# Patient Record
Sex: Male | Born: 1955 | Race: White | Smoking: Former smoker
Health system: Southern US, Community
[De-identification: ages and names within clinical notes are randomized; demographics above are authoritative.]

## PROBLEM LIST (undated history)

## (undated) DIAGNOSIS — I6529 Occlusion and stenosis of unspecified carotid artery: Secondary | ICD-10-CM

## (undated) DIAGNOSIS — I499 Cardiac arrhythmia, unspecified: Secondary | ICD-10-CM

## (undated) DIAGNOSIS — J45909 Unspecified asthma, uncomplicated: Secondary | ICD-10-CM

## (undated) DIAGNOSIS — Z9289 Personal history of other medical treatment: Secondary | ICD-10-CM

## (undated) DIAGNOSIS — H409 Unspecified glaucoma: Secondary | ICD-10-CM

## (undated) DIAGNOSIS — I10 Essential (primary) hypertension: Secondary | ICD-10-CM

## (undated) DIAGNOSIS — I639 Cerebral infarction, unspecified: Secondary | ICD-10-CM

## (undated) DIAGNOSIS — E7800 Pure hypercholesterolemia, unspecified: Secondary | ICD-10-CM

## (undated) DIAGNOSIS — K219 Gastro-esophageal reflux disease without esophagitis: Secondary | ICD-10-CM

## (undated) HISTORY — DX: Occlusion and stenosis of unspecified carotid artery: I65.29

## (undated) HISTORY — PX: APPENDECTOMY: SHX54

## (undated) HISTORY — PX: VASCULAR SURGERY: SHX849

## (undated) HISTORY — DX: Personal history of other medical treatment: Z92.89

## (undated) HISTORY — PX: EYE SURGERY: SHX253

---

## 2004-05-30 ENCOUNTER — Emergency Department (HOSPITAL_COMMUNITY): Admission: EM | Admit: 2004-05-30 | Discharge: 2004-05-30 | Payer: Self-pay | Admitting: Emergency Medicine

## 2004-06-25 ENCOUNTER — Emergency Department (HOSPITAL_COMMUNITY): Admission: EM | Admit: 2004-06-25 | Discharge: 2004-06-25 | Payer: Self-pay | Admitting: Emergency Medicine

## 2005-08-14 ENCOUNTER — Emergency Department (HOSPITAL_COMMUNITY): Admission: EM | Admit: 2005-08-14 | Discharge: 2005-08-14 | Payer: Self-pay | Admitting: Emergency Medicine
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
## 2008-09-25 ENCOUNTER — Emergency Department (HOSPITAL_COMMUNITY): Admission: EM | Admit: 2008-09-25 | Discharge: 2008-09-25 | Payer: Self-pay | Admitting: Emergency Medicine

## 2008-10-17 ENCOUNTER — Emergency Department (HOSPITAL_COMMUNITY): Admission: EM | Admit: 2008-10-17 | Discharge: 2008-10-17 | Payer: Self-pay | Admitting: Emergency Medicine

## 2009-08-24 ENCOUNTER — Encounter
Admission: RE | Admit: 2009-08-24 | Discharge: 2009-10-17 | Payer: Self-pay | Admitting: Physical Medicine & Rehabilitation

## 2009-08-27 ENCOUNTER — Ambulatory Visit: Payer: Self-pay | Admitting: Physical Medicine & Rehabilitation

## 2009-09-24 ENCOUNTER — Ambulatory Visit: Payer: Self-pay | Admitting: Physical Medicine & Rehabilitation

## 2009-10-17 ENCOUNTER — Emergency Department (HOSPITAL_COMMUNITY): Admission: EM | Admit: 2009-10-17 | Discharge: 2009-10-17 | Payer: Self-pay | Admitting: Emergency Medicine

## 2009-10-23 ENCOUNTER — Ambulatory Visit: Payer: Self-pay | Admitting: Physical Medicine & Rehabilitation

## 2009-10-23 ENCOUNTER — Encounter
Admission: RE | Admit: 2009-10-23 | Discharge: 2009-10-24 | Payer: Self-pay | Admitting: Physical Medicine & Rehabilitation

## 2010-04-16 ENCOUNTER — Ambulatory Visit (HOSPITAL_COMMUNITY): Admission: RE | Admit: 2010-04-16 | Discharge: 2010-04-16 | Payer: Self-pay | Admitting: Internal Medicine
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
## 2012-08-07 ENCOUNTER — Emergency Department (HOSPITAL_COMMUNITY)
Admission: EM | Admit: 2012-08-07 | Discharge: 2012-08-07 | Disposition: A | Discharge status: Home / Self Care | Payer: Self-pay | Source: Home / Self Care | Attending: Family Medicine | Admitting: Family Medicine

## 2012-08-07 ENCOUNTER — Encounter (HOSPITAL_COMMUNITY): Payer: Self-pay | Admitting: Emergency Medicine

## 2012-08-07 DIAGNOSIS — J029 Acute pharyngitis, unspecified: Secondary | ICD-10-CM

## 2012-08-07 HISTORY — DX: Unspecified asthma, uncomplicated: J45.909

## 2012-08-07 MED ORDER — CETIRIZINE HCL 10 MG PO TABS: 10.0000 mg | ORAL_TABLET | Freq: Every day | ORAL | Status: DC

## 2012-08-07 MED ORDER — IPRATROPIUM BROMIDE 0.06 % NA SOLN: 2.0000 | Freq: Four times a day (QID) | NASAL | Status: DC

## 2012-08-07 NOTE — ED Provider Notes (Signed)
History     CSN: [id number]  Arrival date & time 08/07/12  1759   First MD Initiated Contact with Patient 08/07/12 1804      Chief Complaint  Patient presents with  . Sore Throat    (Consider location/radiation/quality/duration/timing/severity/associated sxs/prior treatment) Patient is a 56 y.o. male presenting with pharyngitis. The history is provided by the patient and the spouse.  Sore Throat This is a new problem. The current episode started 3 to 5 hours ago (looked up while talking to mother and had sudden difficulty swallowing soreness.). The problem has not changed since onset.Associated symptoms comments: Copious pnd, no resp or vocal difficulty.. The symptoms are aggravated by swallowing.    Past Medical History  Diagnosis Date  . Asthma     Past Surgical History  Procedure Date  . Appendectomy     History reviewed. No pertinent family history.  History  Substance Use Topics  . Smoking status: Never Smoker   . Smokeless tobacco: Not on file  . Alcohol Use: No      Review of Systems  Constitutional: Negative.   HENT: Positive for congestion, rhinorrhea and postnasal drip. Negative for trouble swallowing, neck pain and voice change.   Respiratory: Negative.   Cardiovascular: Negative.   Gastrointestinal: Negative.   Skin: Negative.     Allergies  Review of patient's allergies indicates no known allergies.  Home Medications   Current Outpatient Rx  Name Route Sig Dispense Refill  . HYDROCODONE-ACETAMINOPHEN 5-500 MG PO CAPS Oral Take 1 capsule by mouth every 6 (six) hours as needed.    Marland Kitchen MOBIC PO Oral Take by mouth.    . CETIRIZINE HCL 10 MG PO TABS Oral Take 1 tablet (10 mg total) by mouth daily. One tab daily for allergies 30 tablet 1  . IPRATROPIUM BROMIDE 0.06 % NA SOLN Nasal Place 2 sprays into the nose 4 (four) times daily. 15 mL 1    BP 123/73  Pulse 72  Temp 97.5 F (36.4 C) (Oral)  Resp 18  SpO2 98%  Physical Exam  Nursing note  and vitals reviewed. Constitutional: He is oriented to person, place, and time. He appears well-developed and well-nourished. No distress.  HENT:  Head: Normocephalic.  Right Ear: External ear normal.  Left Ear: External ear normal.  Nose: Nose normal.  Mouth/Throat: Oropharynx is clear and moist.  Eyes: Conjunctivae normal are normal. Pupils are equal, round, and reactive to light.  Neck: Normal range of motion. Neck supple.  Cardiovascular: Normal rate, regular rhythm, normal heart sounds and intact distal pulses.   Pulmonary/Chest: Effort normal and breath sounds normal.  Lymphadenopathy:    He has no cervical adenopathy.  Neurological: He is alert and oriented to person, place, and time.  Skin: Skin is warm and dry.    ED Course  Procedures (including critical care time)  Labs Reviewed - No data to display No results found.   1. Allergic pharyngitis       MDM          Linna Hoff, MD 08/07/12 [phone]

## 2012-08-07 NOTE — ED Notes (Addendum)
Pt states "he swallowed his adams apple". Pt denies sob and pain. Feels uncomfortable. Pt states that earlier in the week he had a very thick mucus drainage in the back of his throat.

## 2012-08-07 NOTE — Discharge Instructions (Signed)
Use medicine as prescribed and see ent doctor if further problems

## 2012-08-17 ENCOUNTER — Other Ambulatory Visit: Payer: Self-pay | Admitting: Otolaryngology

## 2012-08-17 DIAGNOSIS — F458 Other somatoform disorders: Secondary | ICD-10-CM

## 2012-08-17 DIAGNOSIS — R09A2 Foreign body sensation, throat: Secondary | ICD-10-CM

## 2012-08-17 DIAGNOSIS — K219 Gastro-esophageal reflux disease without esophagitis: Secondary | ICD-10-CM

## 2012-08-18 ENCOUNTER — Ambulatory Visit
Admission: RE | Admit: 2012-08-18 | Discharge: 2012-08-18 | Disposition: A | Discharge status: Home / Self Care | Payer: Medicare Other | Source: Ambulatory Visit | Attending: Otolaryngology | Admitting: Otolaryngology

## 2012-08-18 DIAGNOSIS — K219 Gastro-esophageal reflux disease without esophagitis: Secondary | ICD-10-CM

## 2012-08-18 DIAGNOSIS — F458 Other somatoform disorders: Secondary | ICD-10-CM

## 2012-08-18 IMAGING — CT CT NECK W/ CM
4 of 5 series · 15 of 33 positions shown, 18 images · IV contrast (75CC OMNI 300)
Comparison: Head CT without contrast [DATE].

CLINICAL DATA: 56-year-old male with foreign body sensation in
throat.  Dysphagia.  Right neck swelling.

CT NECK WITH CONTRAST
TECHNIQUE: Multidetector CT imaging of the neck was performed with
intravenous contrast.
Contrast: 75mL OMNIPAQUE IOHEXOL 300 MG/ML  SOLN

[Series 2: axial neck · axial · 0.47mm/px · z∈[+99,+156]mm · 2 of 114 slices shown]
[im 23/114  bone]
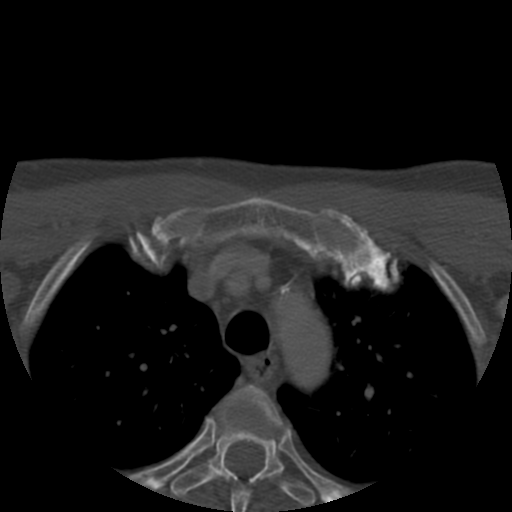
[im 46/114  bone]
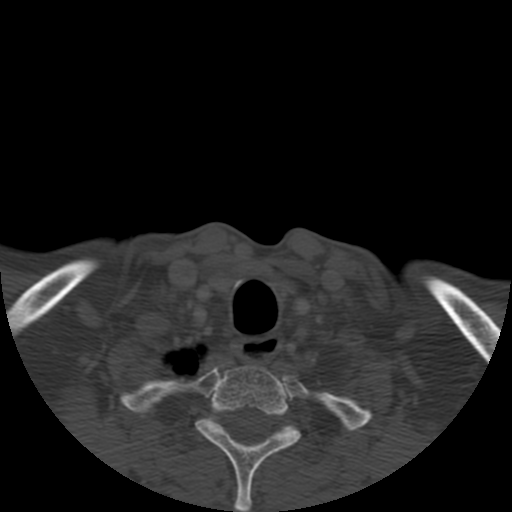

[Series 400: cor · coronal · 0.57mm/px · 3 of 97 slices shown]
[im 20/97  bone]
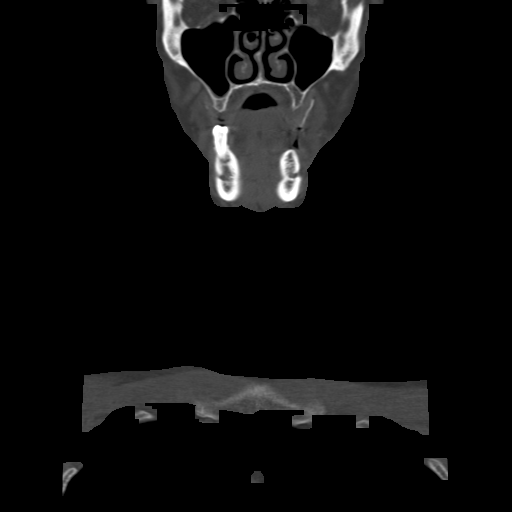
[im 39/97  bone]
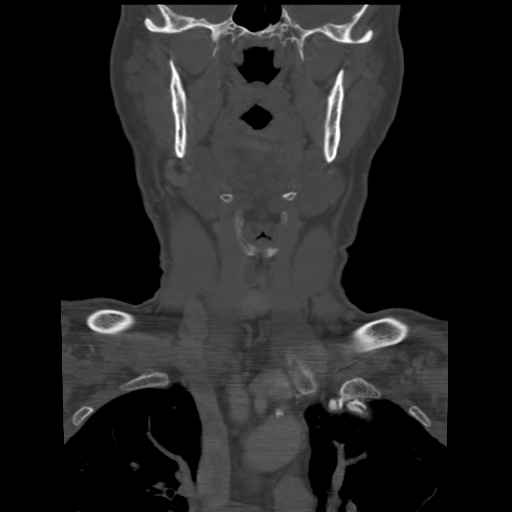
[im 58/97  bone]
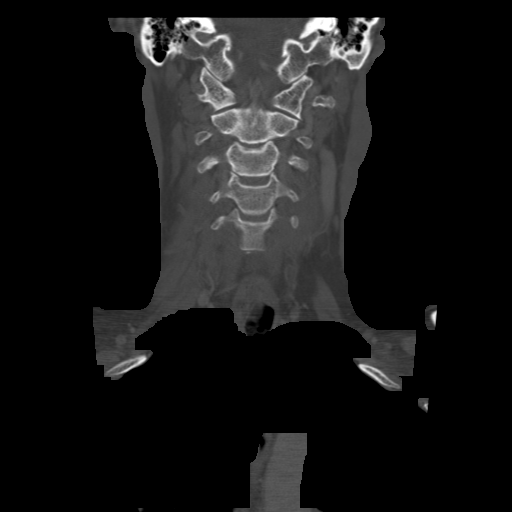

[Series 401: sag · sagittal · 0.57mm/px · 5 of 112 slices shown, 6 images]
[im 38/112  bone]
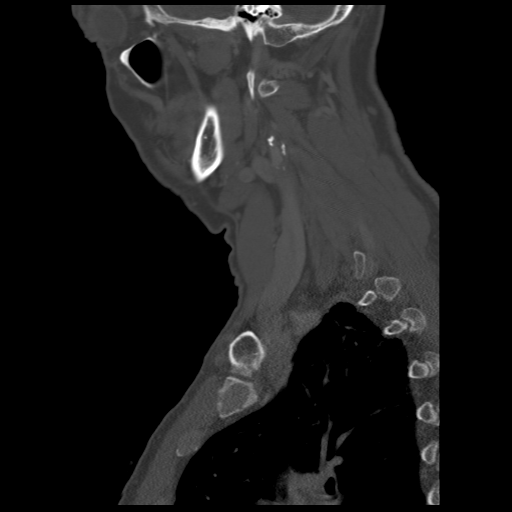
[im 47/112  bone]
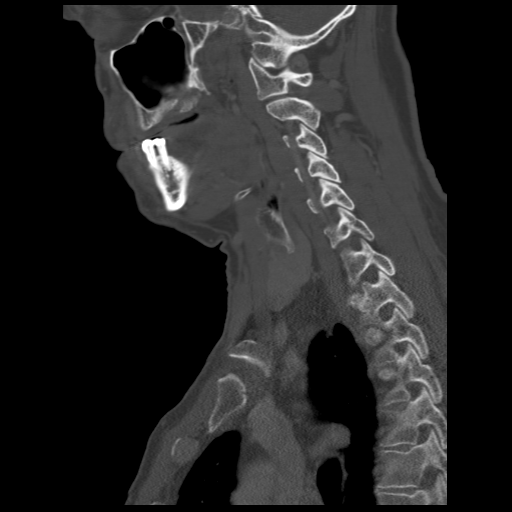
[im 56/112  soft-tissue]
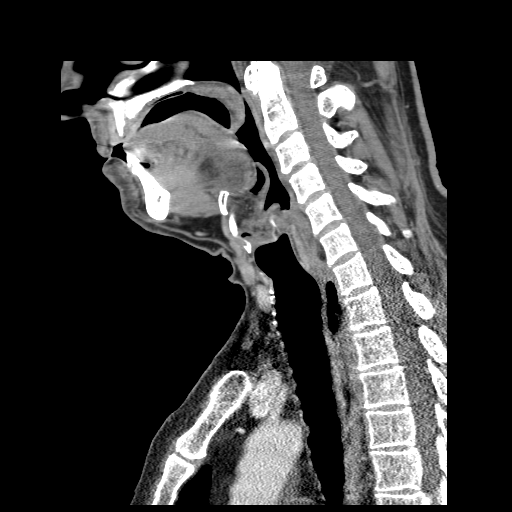
[im 56/112  bone]
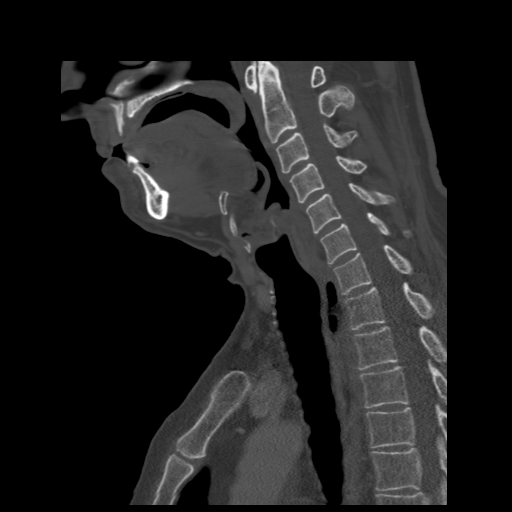
[im 65/112  bone]
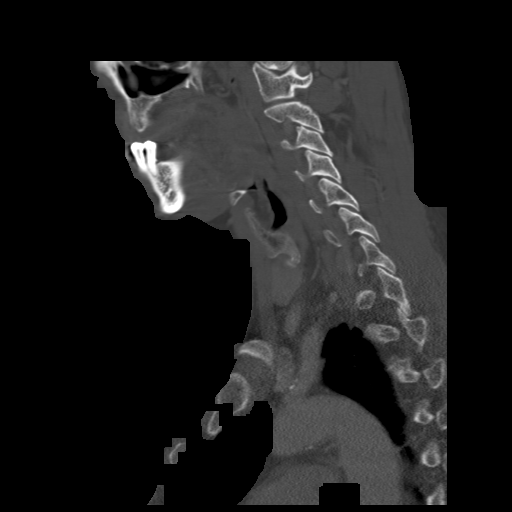
[im 75/112  bone]
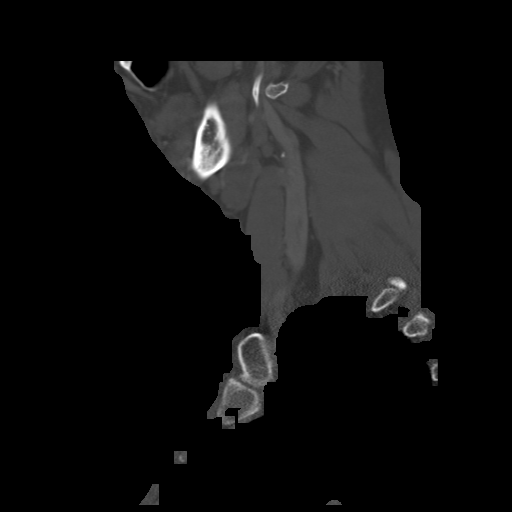

[Series 402: angled axial · axial · 0.47mm/px · z∈[+72,+231]mm · 5 of 129 slices shown, 7 images]
[im 22/129  soft-tissue]
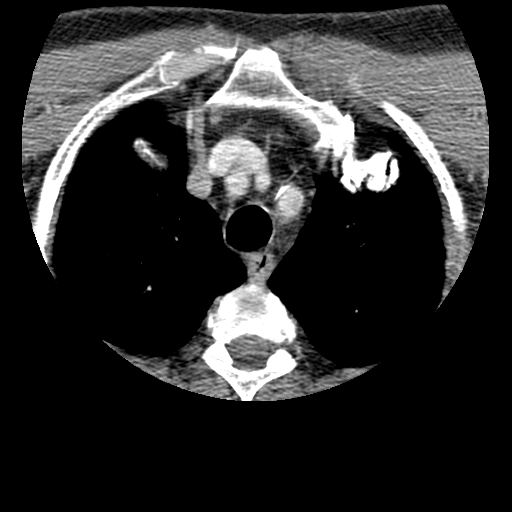
[im 22/129  bone]
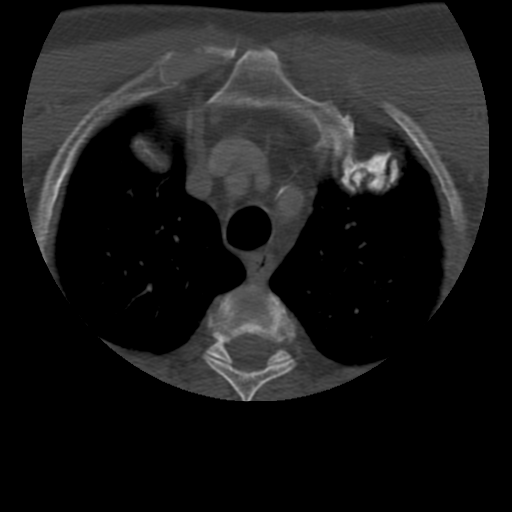
[im 43/129  bone]
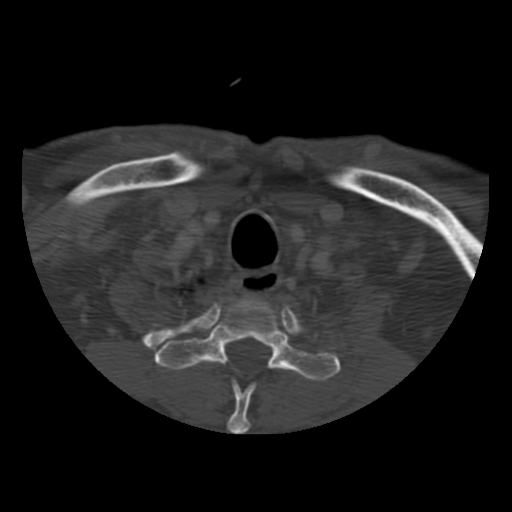
[im 65/129  bone]
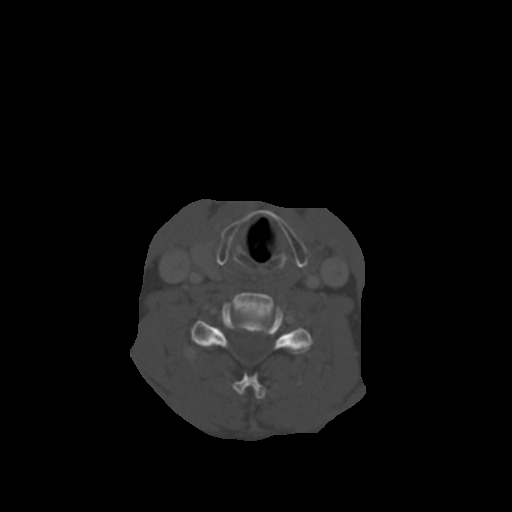
[im 86/129  bone]
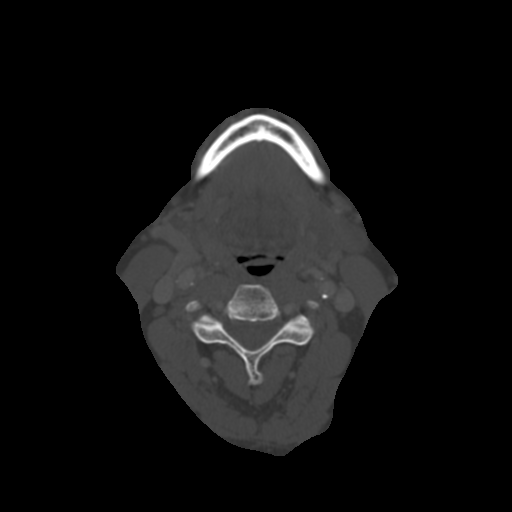
[im 107/129  soft-tissue]
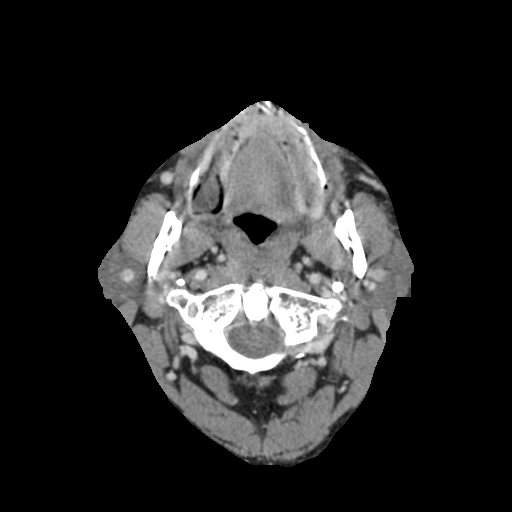
[im 107/129  bone]
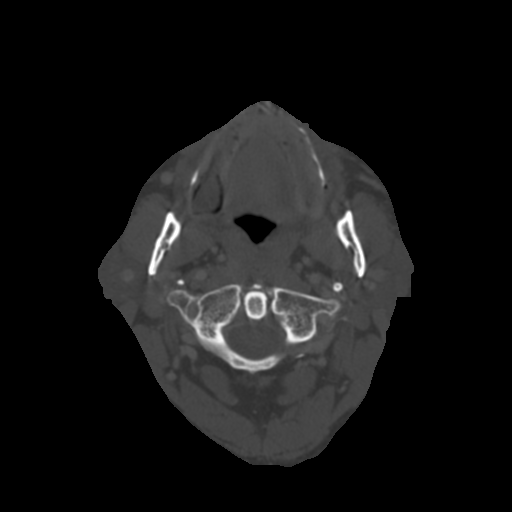

[15 of 33 positions shown; findings below may reference images not displayed]

FINDINGS: Area of clinical concern/abnormal sensation marked on the
scan at the anterior neck just to the right of midline, series 2
image 61.  Subjacent to the marker there is a prominent thyroidal
vein.  This is at the level of the thyroid lobes which appear
within normal limits.  The other nearby anterior neck soft tissues
appear normal.

No cervical lymphadenopathy.

Mild motion artifact affecting visualization of the pharynx.  No
discrete pharyngeal or laryngeal mass or abnormality is identified.
The glottis is closed.  Subglottic trachea, cervical esophagus, and
visualized upper thoracic esophagus appear within normal limits.

Major vascular structures in the neck are patent.  There is
bilateral carotid bifurcation and proximal ICA atherosclerosis
including calcified and soft plaque.  Stenosis on the right appears
to be hemodynamically significant (series 2 image 32), estimated
stenosis 68-70 % with respect to the distal vessel.

Retropharyngeal, parapharyngeal, and sublingual spaces are within
normal limits.  Submandibular and parotid glands are within normal
limits.  Negative visualized superior mediastinum. Visualized orbit
soft tissues are within normal limits.

Visualized paranasal sinuses and mastoids are clear.  Severe dental
caries in the residual left mandibular molar.  Intermittent but
overall mild for age cervical spine degenerative osseous changes.
No acute osseous abnormality identified.  Minor scarring and
paraseptal changes at the lung apices.  Otherwise negative
visualized lung parenchyma.
IMPRESSION: 1.  No neck mass or cervical lymphadenopathy.
2.  Age advanced carotid atherosclerosis with significant/high-
grade right ICA stenosis suspected.
3.  Dental caries left mandibular molar.

## 2012-08-18 MED ORDER — IOHEXOL 300 MG/ML  SOLN
75.0000 mL | Freq: Once | INTRAMUSCULAR | Status: AC | PRN
  Administered 2012-08-18: 75 mL via INTRAVENOUS

## 2013-09-20 ENCOUNTER — Encounter: Payer: Self-pay | Admitting: Cardiology

## 2013-09-20 ENCOUNTER — Encounter: Payer: Self-pay | Admitting: Interventional Cardiology

## 2013-09-20 ENCOUNTER — Ambulatory Visit (INDEPENDENT_AMBULATORY_CARE_PROVIDER_SITE_OTHER): Payer: Medicare Other | Admitting: Interventional Cardiology

## 2013-09-20 VITALS — Ht 72.0 in | Wt 196.8 lb

## 2013-09-20 DIAGNOSIS — I4891 Unspecified atrial fibrillation: Secondary | ICD-10-CM | POA: Insufficient documentation

## 2013-09-20 MED ORDER — METOPROLOL SUCCINATE ER 25 MG PO TB24: 25.0000 mg | ORAL_TABLET | Freq: Every day | ORAL | Status: DC

## 2013-09-20 MED ORDER — ASPIRIN EC 325 MG PO TBEC: 325.0000 mg | DELAYED_RELEASE_TABLET | Freq: Every day | ORAL | Status: DC
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
## 2013-09-20 NOTE — Patient Instructions (Signed)
Your physician has requested that you have an echocardiogram. Echocardiography is a painless test that uses sound waves to create images of your heart. It provides your doctor with information about the size and shape of your heart and how well your heart's chambers and valves are working. This procedure takes approximately one hour. There are no restrictions for this procedure.  Your physician has recommended you make the following change in your medication:   1. Start Aspirin 325 mg 1 tablet by mouth daily.  2. Start Metoprolol Succinate 25 mg 1 tablet by mouth daily.

## 2013-10-10 ENCOUNTER — Encounter: Payer: Self-pay | Admitting: Cardiovascular Disease

## 2013-10-10 ENCOUNTER — Ambulatory Visit (HOSPITAL_COMMUNITY): Payer: Medicare Other | Attending: Cardiovascular Disease | Admitting: Cardiology

## 2013-10-10 DIAGNOSIS — Z87891 Personal history of nicotine dependence: Secondary | ICD-10-CM | POA: Insufficient documentation

## 2013-10-10 DIAGNOSIS — I4891 Unspecified atrial fibrillation: Secondary | ICD-10-CM | POA: Insufficient documentation

## 2013-10-10 NOTE — Progress Notes (Signed)
Echo performed. 

## 2013-10-17 ENCOUNTER — Telehealth: Payer: Self-pay | Admitting: Cardiology

## 2013-10-17 ENCOUNTER — Telehealth: Payer: Self-pay | Admitting: Interventional Cardiology

## 2013-10-17 DIAGNOSIS — Z79899 Other long term (current) drug therapy: Secondary | ICD-10-CM

## 2013-10-17 MED ORDER — FUROSEMIDE 40 MG PO TABS: 40.0000 mg | ORAL_TABLET | Freq: Every day | ORAL | Status: DC

## 2013-10-17 NOTE — Telephone Encounter (Signed)
Pt.notified

## 2013-10-17 NOTE — Telephone Encounter (Signed)
New problem    Pt has questions about his new med and the reaction to his sever clog coma??   Please give him a call back.

## 2013-10-17 NOTE — Telephone Encounter (Signed)
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 Ok to take lasix.

## 2013-10-17 NOTE — Telephone Encounter (Signed)
Pt notified.Pt states he has mild SOB. Per Dr. Eldridge Dace pt can try lasix 40 mg qd and come back for Bmet on 10/24/13. Pt agreeable and rx sent in.

## 2013-10-17 NOTE — Telephone Encounter (Signed)
Message copied by Theda Sers on Mon Oct 17, 2013 10:45 AM ------      Message from: Corky Crafts      Created: Mon Oct 17, 2013 10:37 AM       Mildly decreased left ventricular function. May be related to the atrial fibrillation.  Likely mild fluid overload as well. This could be treated with diuretics if he has swelling or shortness of breath. ------

## 2013-10-24 ENCOUNTER — Other Ambulatory Visit (INDEPENDENT_AMBULATORY_CARE_PROVIDER_SITE_OTHER): Payer: Medicare Other

## 2013-10-24 DIAGNOSIS — Z79899 Other long term (current) drug therapy: Secondary | ICD-10-CM

## 2013-10-24 LAB — BASIC METABOLIC PANEL
BUN: 24 mg/dL — ABNORMAL HIGH (ref 6–23)
Calcium: 9.7 mg/dL (ref 8.4–10.5)
GFR: 40.41 mL/min — ABNORMAL LOW (ref >60.00)
Glucose, Bld: 99 mg/dL (ref 70–99)
Potassium: 4 mEq/L (ref 3.5–5.1)
Sodium: 138 mEq/L (ref 135–145)

## 2013-11-07 ENCOUNTER — Encounter (INDEPENDENT_AMBULATORY_CARE_PROVIDER_SITE_OTHER): Payer: Self-pay

## 2013-11-07 ENCOUNTER — Ambulatory Visit (INDEPENDENT_AMBULATORY_CARE_PROVIDER_SITE_OTHER): Payer: Medicare HMO | Admitting: Interventional Cardiology

## 2013-11-07 ENCOUNTER — Encounter: Payer: Self-pay | Admitting: Interventional Cardiology

## 2013-11-07 VITALS — BP 130/100 | HR 52 | Ht 72.0 in | Wt 205.8 lb

## 2013-11-07 DIAGNOSIS — I4891 Unspecified atrial fibrillation: Secondary | ICD-10-CM
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
## 2013-11-07 NOTE — Patient Instructions (Signed)
Your physician recommends that you continue on your current medications as directed. Please refer to the Current Medication list given to you today.  Your physician wants you to follow-up in: 6 Months with Dr. Varanasi. You will receive a reminder letter in the mail two months in advance. If you don't receive a letter, please call our office to schedule the follow-up appointment. 

## 2013-11-10 ENCOUNTER — Ambulatory Visit: Payer: Medicare Other | Admitting: Interventional Cardiology

## 2015-03-21 ENCOUNTER — Encounter: Payer: Self-pay | Admitting: General Surgery

## 2015-03-21 NOTE — Progress Notes (Signed)
Tyler Burns 03/20/2015 10:05 AM [facility] Patient #: [id number] DOB: [birth year] Divorced / Language: [place] / Race: White Male  History of Present Illness Tyler Burns(Tyler Burns J. Tyler Schlotter MD; 03/20/2015 11:30 AM) Patient words: hernia.  The patient is a 59 year old male   Note:He was referred by Dr. Vernie Burns for further evaluation and treatment of a right inguinal hernia. About 4 months ago, he began noticing discomfort in his right groin and testicle and a small amount of swelling there. He was referred to Dr. Vernie Burns because of an elevated PSA. On physical exam, he was noted to have a right inguinal hernia. He states it bothers him when he standing for a long period of time. No difficulty with urination. No difficulty with constipation. He is disabled and does not do a lot of heavy lifting. He is unaware of any family history of inguinal hernia. His girlfriend lives with him.   Other Problems Tyler Burns(Tyler Burns, CMA; 03/20/2015 10:06 AM) Gastroesophageal Reflux Disease  Past Surgical History Tyler Burns(Tyler Burns, CMA; 03/20/2015 10:06 AM) Vasectomy  Diagnostic Studies History Tyler Burns(Tyler [place], [place]; 03/20/2015 10:06 AM) Colonoscopy never  Allergies Tyler Burns(Tyler Burns, CMA; 03/20/2015 10:06 AM) No Known Allergies05/24/2016  Medication History Tyler Burns(Tyler Burns, CMA; 03/20/2015 10:07 AM) Metoprolol Succinate ER (25MG  Tablet ER 24HR, Oral) Active. Lovastatin (40MG  Tablet, Oral) Active. Lisinopril (20MG  Tablet, Oral) Active. Hydrocodone-Acetaminophen (5-325MG  Tablet, Oral) Active. Furosemide (40MG  Tablet, Oral) Active. Lactulose (10GM/15ML Solution, Oral) Active. ZyrTEC Allergy (10MG  Tablet, Oral) Active. Aspirin (325MG  Tablet, Oral) Active. Medications Reconciled  Social History Tyler Burns(Tyler [place], [place]; 03/20/2015 10:06 AM) Alcohol use Remotely quit alcohol use. Caffeine use Carbonated beverages, Coffee, Tea. No drug use Tobacco use Former smoker.  Family History Tyler Burns(Tyler Burns, [place];  03/20/2015 10:06 AM) Diabetes Mellitus Brother, Sister. Heart Disease Father, Mother. Kidney Disease Mother, Sister.  Review of Systems Tyler Burns(Tyler Burns CMA; 03/20/2015 10:06 AM) General Not Present- Appetite Loss, Chills, Fatigue, Fever, Night Sweats, Weight Gain and Weight Loss. Skin Not Present- Change in Wart/Mole, Dryness, Hives, Jaundice, New Lesions, Non-Healing Wounds, Rash and Ulcer. HEENT Not Present- Earache, Hearing Loss, Hoarseness, Nose Bleed, Oral Ulcers, Ringing in the Ears, Seasonal Allergies, Sinus Pain, Sore Throat, Visual Disturbances, Wears glasses/contact lenses and Yellow Eyes. Respiratory Not Present- Bloody sputum, Chronic Cough, Difficulty Breathing, Snoring and Wheezing. Breast Not Present- Breast Mass, Breast Pain, Nipple Discharge and Skin Changes. Cardiovascular Not Present- Chest Pain, Difficulty Breathing Lying Down, Leg Cramps, Palpitations, Rapid Heart Rate, Shortness of Breath and Swelling of Extremities. Gastrointestinal Not Present- Abdominal Pain, Bloating, Bloody Stool, Change in Bowel Habits, Chronic diarrhea, Constipation, Difficulty Swallowing, Excessive gas, Gets full quickly at meals, Hemorrhoids, Indigestion, Nausea, Rectal Pain and Vomiting. Male Genitourinary Not Present- Blood in Urine, Change in Urinary Stream, Frequency, Impotence, Nocturia, Painful Urination, Urgency and Urine Leakage. Neurological Not Present- Decreased Memory, Fainting, Headaches, Numbness, Seizures, Tingling, Tremor, Trouble walking and Weakness. Psychiatric Not Present- Anxiety, Bipolar, Change in Sleep Pattern, Depression, Fearful and Frequent crying. Endocrine Not Present- Cold Intolerance, Excessive Hunger, Hair Changes, Heat Intolerance, Hot flashes and New Diabetes. Hematology Not Present- Easy Bruising, Excessive bleeding, Gland problems, HIV and Persistent Infections.   Vitals Tyler Burns(Tyler Burns CMA; 03/20/2015 10:08 AM) 03/20/2015 10:07 AM Weight: 214 lb Height:  72in Body Surface Area: 2.22 m Body Mass Index: 29.02 kg/m Temp.: 97.61F(Oral)  Pulse: 80 (Regular)  Resp.: 17 (Unlabored)  BP: 142/80 (Sitting, Left Arm, Standard)    Physical Exam Tyler Burns(Tyler Burns J. Tyler Teater MD; 03/20/2015 11:37 AM) The physical exam findings are as follows: Note:General:  Overweight male in NAD. Pleasant and cooperative.  CV: Irregular rate and rhythm.  CHEST: Breath sounds equal and clear. Respirations nonlabored.  ABDOMEN: Soft, nontender, small reducible umbilical hernia.  GU: Tender, reducible right inguinal bulge. No left inguinal bulge.  MUSCULOSKELETAL: FROM, good muscle tone, no edema, no venous stasis changes  SKIN: No jaundice.  NEUROLOGIC: Alert and oriented, answers questions appropriately, normal gait and station.  PSYCHIATRIC: Normal mood, affect , and behavior.    Assessment & Plan Tyler Pollack MD; 03/20/2015 11:40 AM) RIGHT INGUINAL HERNIA (550.90  K40.90) Impression: The hernia is symptomatic. He also has atrial fibrillation and missed his follow-up appointment with Dr. Eldridge Burns. The last time he saw Dr. Eldridge Burns was January 2015.  Plan: Referral to cardiology for preoperative assessment and his routine follow-up. I recommended an open right inguinal hernia repair with mesh following that. I have explained the procedure, risks, and aftercare of inguinal hernia repair. Risks include but are not limited to bleeding, infection, wound problems, anesthesia, recurrence, bladder or intestine injury, urinary retention, testicular dysfunction, chronic pain, mesh problems. He seems to understand and agrees with the plan. We'll wait for his visit with Dr. Eldridge Burns. Current Plans  Referred to Cardiology, for evaluation and follow up (Cardiology).   Tyler Peace, MD

## 2015-05-01 ENCOUNTER — Telehealth: Payer: Self-pay | Admitting: Interventional Cardiology

## 2015-05-01 NOTE — Telephone Encounter (Signed)
New Message  RN from Dr. Abbey Chattersosenbower office following up about surgical clearance. Please call back and discuss.

## 2015-05-01 NOTE — Telephone Encounter (Signed)
Spoke with Cyndra NumbersBernie at Dr. Maris Bergerosenbower's office. Informed her that we have tried to contact the pt to schedule an appt for clearance and that the number we have has been disconnected. Cyndra NumbersBernie provided me with new number for pt [phone]. Spoke with pt and scheduled him to see Tereso NewcomerScott Weaver, PA-C tomorrow at 11:10AM for clearance and 1 yr f/u. Pt verbalized understanding and was in agreement with this plan.

## 2015-05-01 NOTE — Progress Notes (Signed)
Cardiology Office Note   Date:  05/02/2015   ID:  Tyler Burns, DOB [birth year], MRN [id number]  PCP:  Quitman Livings, MD  Cardiologist:  Dr. Everette Rank     Chief Complaint  Patient presents with  . Atrial Fibrillation  . Surgical Clearance     History of Present Illness: Tyler Burns is a 59 y.o. male with a hx of chronic atrial fibrillation, CKD, mildly depressed LV function. Last seen by Dr. Eldridge Dace 10/2013.  CHADS2-VASc=1 (sCHF).  He has been treated with ASA only.    He has a R inguinal hernia.  He is referred for surgical clearance prior to open repair.  Surgeon is Dr. Avel Peace.  He is significantly limited by his chronic back pain.  He cannot achieve 4 METs.  He denies chest pain or dyspnea with minimal activity.  He denies syncope.  He denies orthopnea, PND, edema.  He has a remote hx of smoking.  He is treated for HL.  He has a FHx of CAD (brother, mother with prior MI; sister died with CHF).     Studies/Reports Reviewed Today:  Echo 10/10/13 Mild focal basal hypertrophy of the septum, EF 45-50%, diffuse HK Trivial AI Mild MR Mild LAE, mild RAE Mild RVE    Past Medical History  Diagnosis Date  . Asthma     Past Surgical History  Procedure Laterality Date  . Appendectomy       Current Outpatient Prescriptions  Medication Sig Dispense Refill  . aspirin EC 325 MG tablet Take 1 tablet (325 mg total) by mouth daily. 30 tablet 0  . bimatoprost (LUMIGAN) 0.01 % SOLN Place 1 drop into both eyes.     . brimonidine (ALPHAGAN P) 0.1 % SOLN Place 1 drop into both eyes 2 (two) times daily.     . brinzolamide (AZOPT) 1 % ophthalmic suspension Place 1 drop into both eyes 2 (two) times daily.     . cetirizine (ZYRTEC) 10 MG tablet Take 1 tablet (10 mg total) by mouth daily. One tab daily for allergies 30 tablet 1  . cyanocobalamin ([employer] VITAMIN B12) 2000 MCG tablet Take 2,000 mcg by mouth daily.     . furosemide (LASIX) 40 MG tablet Take 1 tablet (40 mg total)  by mouth daily. 90 tablet 1  . HYDROcodone-acetaminophen (NORCO/VICODIN) 5-325 MG per tablet Take 1 tablet by mouth every 6 (six) hours as needed for moderate pain.    Marland Kitchen lactulose (CHRONULAC) 10 GM/15ML solution Take 10 g by mouth daily as needed for moderate constipation.   10  . lisinopril (PRINIVIL,ZESTRIL) 20 MG tablet Take 20 mg by mouth daily.   0  . lovastatin (MEVACOR) 20 MG tablet Take 20 mg by mouth at bedtime.    . metoprolol succinate (TOPROL XL) 25 MG 24 hr tablet Take 1 tablet (25 mg total) by mouth daily. 30 tablet 11  . pyridOXINE (VITAMIN B-6) 25 MG tablet Take 25 mg by mouth daily.     . vitamin E 400 UNIT capsule Take 400 Units by mouth daily.      No current facility-administered medications for this visit.    Allergies:   Brinzolamide-brimonidine    Social History:  The patient  reports that he has quit smoking. He does not have any smokeless tobacco history on file. He reports that he does not drink alcohol or use illicit drugs.   Family History:  The patient's family history includes Congestive Heart Failure in his mother; Heart attack (  age of onset: 86) in his brother; Heart attack (age of onset: 37) in his mother; Heart failure (age of onset: 44) in his sister; Leukemia in his brother. There is no history of Stroke.    ROS:   Please see the history of present illness.   Review of Systems  Cardiovascular: Positive for irregular heartbeat.  All other systems reviewed and are negative.    PHYSICAL EXAM: VS:  BP 110/78 mmHg  Pulse 92  Ht 6' (1.829 m)  Wt 218 lb (98.884 kg)  BMI 29.56 kg/m2    Wt Readings from Last 3 Encounters:  05/02/15 218 lb (98.884 kg)  11/07/13 205 lb 12.8 oz (93.35 kg)  09/20/13 196 lb 12.8 oz (89.268 kg)     GEN: Well nourished, well developed, in no acute distress HEENT: normal Neck: no JVD, no carotid bruits, no masses Cardiac:  Normal S1/S2, irregularly irregular rhythm; no murmur ,  no rubs or gallops, no edema     Respiratory:  clear to auscultation bilaterally, no wheezing, rhonchi or rales. GI: soft, nontender, nondistended, + BS MS: no deformity or atrophy Skin: warm and dry  Neuro:  CNs II-XII intact, Strength and sensation are intact Psych: Normal affect   EKG:  EKG is ordered today.  It demonstrates:   AFib, HR 92, no change from prior   Recent Labs: No results found for requested labs within last 365 days.    Lipid Panel No results found for: CHOL, TRIG, HDL, CHOLHDL, VLDL, LDLCALC, LDLDIRECT    ASSESSMENT AND PLAN:  1.  Pre-operative cardiovascular examination:  He is not able to achieve 4 METs of activity due to his chronic back pain.  He has several risk factors for CAD.  Prior echo with EF 45-50%.  Hernia repair would overall be low risk.  However, I cannot completely assess his risk of CV complications.  I will therefore arrange a nuclear stress test.  He cannot walk on a treadmill. Therefore, he will undergo a Lexiscan Myoview.  As long as this is low risk, he will not require any further ischemic testing prior to his non-cardiac surgery.   2.  Persistent atrial fibrillation:  Rate controlled.  CHADS2-VASc=1 at this time with hx of systolic HF/LV dysfunction.  No hx of HTN, vascular disease, diabetes or stroke.  He is < 59 yo.  His risk factors will change once he is 86 and we discussed the possible need for long term anticoagulation in the future.  Continue current dose of beta-blocker.  Continue ASA for now.  If ASA needs to be held for his surgery, it should be resumed once felt to be safe by general surgery.    3.  LV dysfunction:  Systolic function mildly depressed by prior echo.  He was placed on Lasix at that time.  However, the patient does not recall a hx of significant dyspnea.  No evidence of volume excess on exam.  He tells me that his PCP has recently checked his renal function.  Will recheck EF on nuclear stress study. 4.  Chronic Kidney Disease:  As noted, his PCP follows  his renal function.   Medication Changes: Current medicines are reviewed at length with the patient today.  Concerns regarding medicines are as outlined above.  The following changes have been made:   Discontinued Medications   LOVASTATIN (MEVACOR) 40 MG TABLET    Take 40 mg by mouth at bedtime.    Modified Medications   No medications on file  New Prescriptions   No medications on file     Labs/ tests ordered today include:   Orders Placed This Encounter  Procedures  . Myocardial Perfusion Imaging  . EKG 12-Lead     Disposition:   FU with Dr. Everette RankJay Varanasi 3 mos.    Signed, Brynda RimScott Blane Worthington, PA-C, MHS 05/02/2015 12:22 PM    [facility] [address], [place], [place]  [address] Phone: [phone]; Fax: [phone]

## 2015-05-01 NOTE — Telephone Encounter (Signed)
Left message for Cyndra NumbersBernie to call back. Attempted to call pt to schedule an appt for surgical clearance. Phone number we have states it has been disconnected.  No other numbers available.

## 2015-05-02 ENCOUNTER — Ambulatory Visit (INDEPENDENT_AMBULATORY_CARE_PROVIDER_SITE_OTHER): Payer: Medicare Other | Admitting: Physician Assistant

## 2015-05-02 ENCOUNTER — Encounter: Payer: Self-pay | Admitting: Physician Assistant

## 2015-05-02 VITALS — BP 110/78 | HR 92 | Ht 72.0 in | Wt 218.0 lb

## 2015-05-02 DIAGNOSIS — Z01810 Encounter for preprocedural cardiovascular examination: Secondary | ICD-10-CM | POA: Diagnosis not present

## 2015-05-02 DIAGNOSIS — I481 Persistent atrial fibrillation: Secondary | ICD-10-CM | POA: Diagnosis not present

## 2015-05-02 DIAGNOSIS — N189 Chronic kidney disease, unspecified: Secondary | ICD-10-CM

## 2015-05-02 DIAGNOSIS — I519 Heart disease, unspecified: Secondary | ICD-10-CM

## 2015-05-02 DIAGNOSIS — I4819 Other persistent atrial fibrillation: Secondary | ICD-10-CM

## 2015-05-02 NOTE — Patient Instructions (Signed)
Medication Instructions:  Your physician recommends that you continue on your current medications as directed. Please refer to the Current Medication list given to you today.   Labwork: NONE  Testing/Procedures: Your physician has requested that you have a lexiscan myoview. THIS IS FOR SURGERY CLEARANCE; TO BE DONE ASAP PER SCOTT WEAVER, PAC. For further information please visit [email or web address]. Please follow instruction sheet, as given.   Follow-Up: DR. VARANASI IN 2-3 MONTHS  Any Other Special Instructions Will Be Listed Below (If Applicable).

## 2015-05-03 ENCOUNTER — Ambulatory Visit (HOSPITAL_COMMUNITY): Payer: Medicare Other | Attending: Cardiovascular Disease

## 2015-05-03 DIAGNOSIS — R9439 Abnormal result of other cardiovascular function study: Secondary | ICD-10-CM | POA: Diagnosis not present

## 2015-05-03 DIAGNOSIS — Z01810 Encounter for preprocedural cardiovascular examination: Secondary | ICD-10-CM | POA: Diagnosis not present

## 2015-05-03 LAB — MYOCARDIAL PERFUSION IMAGING
CHL CUP NUCLEAR SRS: 3
CHL CUP NUCLEAR SSS: 10
LV dias vol: 89 mL
LVSYSVOL: 41 mL
Peak HR: 107 {beats}/min
RATE: 0.45
Rest HR: 61 {beats}/min
SDS: 7
TID: 0.82

## 2015-05-03 IMAGING — NM NM MYOCAR MULTI W/ SPECT
6 series · 36 of 36 positions shown · non-contrast
Comparison: none

[Series 1: rest · 6.51mm/px · 6 of 64 frames shown]
[frame 6/64]
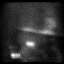
[frame 16/64]
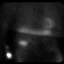
[frame 27/64]
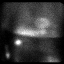
[frame 38/64]
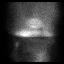
[frame 48/64]
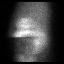
[frame 59/64]
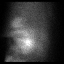

[Series 1: stress_(id)_sa · 6.5mm · 6.51mm/px · 6 of 512 frames shown]
[frame 43/512]
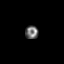
[frame 128/512]
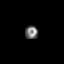
[frame 214/512]
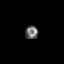
[frame 299/512]
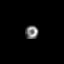
[frame 384/512]
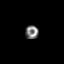
[frame 470/512]
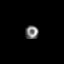

[Series 1: rest_(id)_sa · 6.5mm · 6.51mm/px · 6 of 64 frames shown]
[frame 6/64]
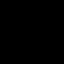
[frame 16/64]
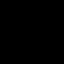
[frame 27/64]
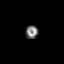
[frame 38/64]
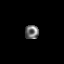
[frame 48/64]
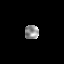
[frame 59/64]
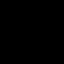

[Series 1: stress summed_(id)_sa · 6.5mm · 6.51mm/px · 6 of 64 frames shown]
[frame 6/64]
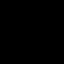
[frame 16/64]
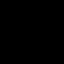
[frame 27/64]
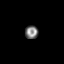
[frame 38/64]
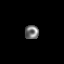
[frame 48/64]
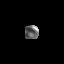
[frame 59/64]
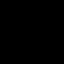

[Series 2: stress · 6.51mm/px · 6 of 64 frames shown (1 of 2)]
[frame 6/64]
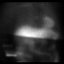
[frame 16/64]
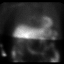
[frame 27/64]
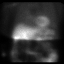
[frame 38/64]
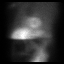
[frame 48/64]
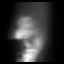
[frame 59/64]
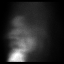

[Series 2: stress · 6.51mm/px · 6 of 512 frames shown (2 of 2)]
[frame 43/512]
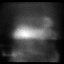
[frame 128/512]
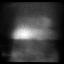
[frame 214/512]
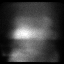
[frame 299/512]
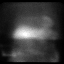
[frame 384/512]
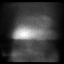
[frame 470/512]
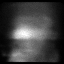

[36 of 36 positions shown; findings below may reference images not displayed]

Canned report from images found in remote index.

Refer to host system for actual result text.

## 2015-05-03 MED ORDER — TECHNETIUM TC 99M SESTAMIBI GENERIC - CARDIOLITE
10.4000 | Freq: Once | INTRAVENOUS | Status: AC | PRN
  Administered 2015-05-03: 10 via INTRAVENOUS

## 2015-05-03 MED ORDER — TECHNETIUM TC 99M SESTAMIBI GENERIC - CARDIOLITE
32.5000 | Freq: Once | INTRAVENOUS | Status: AC | PRN
  Administered 2015-05-03: 33 via INTRAVENOUS

## 2015-05-03 MED ORDER — REGADENOSON 0.4 MG/5ML IV SOLN
0.4000 mg | Freq: Once | INTRAVENOUS | Status: AC
  Administered 2015-05-03: 0.4 mg via INTRAVENOUS

## 2015-05-10 ENCOUNTER — Encounter: Payer: Self-pay | Admitting: Physician Assistant

## 2015-05-14 ENCOUNTER — Telehealth: Payer: Self-pay | Admitting: Interventional Cardiology

## 2015-05-14 NOTE — Telephone Encounter (Signed)
Please tell patient that his stress test does not show any significant ischemia and he may proceed with his surgery as planned at acceptable risk.    I reviewed his stress test with Dr. Everette RankJay Varanasi.   Tereso NewcomerScott Weaver, PA-C     05/10/2015 5:24 PM

## 2015-05-16 ENCOUNTER — Encounter: Payer: Self-pay | Admitting: General Surgery

## 2015-05-16 NOTE — Progress Notes (Signed)
Tyler Burns 03/20/2015 10:05 AM [facility] Patient #: [id number] DOB: [birth year] Divorced / Language: [place] / Race: White Male  History of Present Illness Tyler Burns(Tyler Wieseler [name] MD; 03/20/2015 11:30 AM) Patient words: hernia.  The patient is a 59 year old male   Note:He was referred by Dr. [name] for further evaluation and treatment of a right inguinal hernia. About 4 months ago, he began noticing discomfort in his right groin and testicle and a small amount of swelling there. He was referred to Dr. [name] because of an elevated PSA. On physical exam, he was noted to have a right inguinal hernia. He states it bothers him when he standing for a long period of time. No difficulty with urination. No difficulty with constipation. He is disabled and does not do a lot of heavy lifting. He is unaware of any family history of inguinal hernia. His girlfriend lives with him.   Other Problems [name]([name], CMA; 03/20/2015 10:06 AM) Gastroesophageal Reflux Disease  Past Surgical History [name]([name], CMA; 03/20/2015 10:06 AM) Vasectomy  Diagnostic Studies History [name]([name], [place]; 03/20/2015 10:06 AM) Colonoscopy never  Allergies [name]([name], CMA; 03/20/2015 10:06 AM) No Known Allergies05/24/2016  Medication History [name]([name], CMA; 03/20/2015 10:07 AM) Metoprolol Succinate ER (25MG  Tablet ER 24HR, Oral) Active. Lovastatin (40MG  Tablet, Oral) Active. Lisinopril (20MG  Tablet, Oral) Active. Hydrocodone-Acetaminophen (5-325MG  Tablet, Oral) Active. Furosemide (40MG  Tablet, Oral) Active. Lactulose (10GM/15ML Solution, Oral) Active. ZyrTEC Allergy (10MG  Tablet, Oral) Active. Aspirin (325MG  Tablet, Oral) Active. Medications Reconciled  Social History [name]([name], [place]; 03/20/2015 10:06 AM) Alcohol use Remotely quit alcohol use. Caffeine use Carbonated beverages, Coffee, Tea. No drug use Tobacco use Former smoker.  Family History [name]([name], [place];  03/20/2015 10:06 AM) Diabetes Mellitus Brother, Sister. Heart Disease Father, Mother. Kidney Disease Mother, Sister.  Review of Systems [name]([name] CMA; 03/20/2015 10:06 AM) General Not Present- Appetite Loss, Chills, Fatigue, Fever, Night Sweats, Weight Gain and Weight Loss. Skin Not Present- Change in Wart/Mole, Dryness, Hives, Jaundice, New Lesions, Non-Healing Wounds, Rash and Ulcer. HEENT Not Present- Earache, Hearing Loss, Hoarseness, Nose Bleed, Oral Ulcers, Ringing in the Ears, Seasonal Allergies, Sinus Pain, Sore Throat, Visual Disturbances, Wears glasses/contact lenses and Yellow Eyes. Respiratory Not Present- Bloody sputum, Chronic Cough, Difficulty Breathing, Snoring and Wheezing. Breast Not Present- Breast Mass, Breast Pain, Nipple Discharge and Skin Changes. Cardiovascular Not Present- Chest Pain, Difficulty Breathing Lying Down, Leg Cramps, Palpitations, Rapid Heart Rate, Shortness of Breath and Swelling of Extremities. Gastrointestinal Not Present- Abdominal Pain, Bloating, Bloody Stool, Change in Bowel Habits, Chronic diarrhea, Constipation, Difficulty Swallowing, Excessive gas, Gets full quickly at meals, Hemorrhoids, Indigestion, Nausea, Rectal Pain and Vomiting. Male Genitourinary Not Present- Blood in Urine, Change in Urinary Stream, Frequency, Impotence, Nocturia, Painful Urination, Urgency and Urine Leakage. Neurological Not Present- Decreased Memory, Fainting, Headaches, Numbness, Seizures, Tingling, Tremor, Trouble walking and Weakness. Psychiatric Not Present- Anxiety, Bipolar, Change in Sleep Pattern, Depression, Fearful and Frequent crying. Endocrine Not Present- Cold Intolerance, Excessive Hunger, Hair Changes, Heat Intolerance, Hot flashes and New Diabetes. Hematology Not Present- Easy Bruising, Excessive bleeding, Gland problems, HIV and Persistent Infections.   Vitals [name]([name] CMA; 03/20/2015 10:08 AM) 03/20/2015 10:07 AM Weight: 214 lb Height:  72in Body Surface Area: 2.22 m Body Mass Index: 29.02 kg/m Temp.: 97.61F(Oral)  Pulse: 80 (Regular)  Resp.: 17 (Unlabored)  BP: 142/80 (Sitting, Left Arm, Standard)    Physical Exam Tyler Burns([name] J. [name] MD; 03/20/2015 11:37 AM) The physical exam findings are as follows: Note:General:  Overweight male in NAD. Pleasant and cooperative.  CV: Irregular rate and rhythm.  CHEST: Breath sounds equal and clear. Respirations nonlabored.  ABDOMEN: Soft, nontender, small reducible umbilical hernia.  GU: Tender, reducible right inguinal bulge. No left inguinal bulge.  MUSCULOSKELETAL: FROM, good muscle tone, no edema, no venous stasis changes  SKIN: No jaundice.  NEUROLOGIC: Alert and oriented, answers questions appropriately, normal gait and station.  PSYCHIATRIC: Normal mood, affect , and behavior.    Assessment & Plan Tyler Burns([name] J. Karista Aispuro MD; 03/20/2015 11:40 AM) RIGHT INGUINAL HERNIA (550.90  K40.90) Impression: The hernia is symptomatic. He also has atrial fibrillation and missed his follow-up appointment with Dr. [name]. The last time he saw Dr. [name] was January 2015.  Plan: Referral to cardiology for preoperative assessment and his routine follow-up. I recommended an open right inguinal hernia repair with mesh following that. I have explained the procedure, risks, and aftercare of inguinal hernia repair. Risks include but are not limited to bleeding, infection, wound problems, anesthesia, recurrence, bladder or intestine injury, urinary retention, testicular dysfunction, chronic pain, mesh problems. He seems to understand and agrees with the plan. We'll wait for his visit with Dr. [name].  [name], MD

## 2015-06-05 NOTE — Pre-Procedure Instructions (Signed)
DAILY CRATE  06/05/2015      [facility]- [place], [place] - [address] [place] [address] Phone: [phone] Fax: [phone]    Your procedure is scheduled on Fri, Aug 19 @ 9:30 AM  Report to [facility] Admitting at 7:30 AM  Call this number if you have problems the morning of surgery:  [phone]   Remember:  Do not eat food or drink liquids after midnight.  Take these medicines the morning of surgery with A SIP OF WATER:Eye Drops,Zyrtec(Cetirizine),Pain Pill(if needed),and Metoprolol(Toprol)              Stop taking your Aspirin,No Goody's,BC's,Aleve,Ibuprofen,Fish Oil,or any Herbal Medications.    Do not wear jewelry.  Do not wear lotions, powders, or colognes.  You may wear deodorant.  Men may shave face and neck.  Do not bring valuables to the hospital.  [facility] is not responsible for any belongings or valuables.  Contacts, dentures or bridgework may not be worn into surgery.  Leave your suitcase in the car.  After surgery it may be brought to your room.  For patients admitted to the hospital, discharge time will be determined by your treatment team.  Patients discharged the day of surgery will not be allowed to drive home.    Special instructions:  [place] - Preparing for Surgery  Before surgery, you can play an important role.  Because skin is not sterile, your skin needs to be as free of germs as possible.  You can reduce the number of germs on you skin by washing with CHG (chlorahexidine gluconate) soap before surgery.  CHG is an antiseptic cleaner which kills germs and bonds with the skin to continue killing germs even after washing.  Please DO NOT use if you have an allergy to CHG or antibacterial soaps.  If your skin becomes reddened/irritated stop using the CHG and inform your nurse when you arrive at Short Stay.  Do not shave (including legs and underarms) for at least 48 hours  prior to the first CHG shower.  You may shave your face.  Please follow these instructions carefully:   1.  Shower with CHG Soap the night before surgery and the                                morning of Surgery.  2.  If you choose to wash your hair, wash your hair first as usual with your       normal shampoo.  3.  After you shampoo, rinse your hair and body thoroughly to remove the                      Shampoo.  4.  Use CHG as you would any other liquid soap.  You can apply chg directly       to the skin and wash gently with scrungie or a clean washcloth.  5.  Apply the CHG Soap to your body ONLY FROM THE NECK DOWN.        Do not use on open wounds or open sores.  Avoid contact with your eyes,       ears, mouth and genitals (private parts).  Wash genitals (private parts)       with your normal soap.  6.  Wash thoroughly, paying special attention to the area where your  surgery        will be performed.  7.  Thoroughly rinse your body with warm water from the neck down.  8.  DO NOT shower/wash with your normal soap after using and rinsing off       the CHG Soap.  9.  Pat yourself dry with a clean towel.            10.  Wear clean pajamas.            11.  Place clean sheets on your bed the night of your first shower and do not        sleep with pets.  Day of Surgery  Do not apply any lotions/deoderants the morning of surgery.  Please wear clean clothes to the hospital/surgery center.    Please read over the following fact sheets that you were given. Pain Booklet, Coughing and Deep Breathing and Surgical Site Infection Prevention

## 2015-06-06 ENCOUNTER — Encounter (HOSPITAL_COMMUNITY): Payer: Self-pay

## 2015-06-06 ENCOUNTER — Encounter (HOSPITAL_COMMUNITY)
Admission: RE | Admit: 2015-06-06 | Discharge: 2015-06-06 | Disposition: A | Discharge status: Home / Self Care | Payer: Medicare Other | Source: Ambulatory Visit | Attending: General Surgery | Admitting: General Surgery

## 2015-06-06 DIAGNOSIS — Z01812 Encounter for preprocedural laboratory examination: Secondary | ICD-10-CM | POA: Insufficient documentation

## 2015-06-06 DIAGNOSIS — K4090 Unilateral inguinal hernia, without obstruction or gangrene, not specified as recurrent: Secondary | ICD-10-CM | POA: Diagnosis not present

## 2015-06-06 HISTORY — DX: Essential (primary) hypertension: I10

## 2015-06-06 HISTORY — DX: Unspecified glaucoma: H40.9

## 2015-06-06 HISTORY — DX: Cardiac arrhythmia, unspecified: I49.9

## 2015-06-06 LAB — COMPREHENSIVE METABOLIC PANEL
ALT: 14 U/L — ABNORMAL LOW (ref 17–63)
AST: 20 U/L (ref 15–41)
Albumin: 3.9 g/dL (ref 3.5–5.0)
Alkaline Phosphatase: 57 U/L (ref 38–126)
Anion gap: 8 (ref 5–15)
BUN: 18 mg/dL (ref 6–20)
CALCIUM: 9.3 mg/dL (ref 8.9–10.3)
CO2: 27 mmol/L (ref 22–32)
Chloride: 104 mmol/L (ref 101–111)
Creatinine, Ser: 1.37 mg/dL — ABNORMAL HIGH (ref 0.61–1.24)
GFR calc Af Amer: >60 mL/min (ref >60)
GFR calc non Af Amer: 54 mL/min — ABNORMAL LOW (ref >60)
Glucose, Bld: 151 mg/dL — ABNORMAL HIGH (ref 65–99)
Potassium: 4.1 mmol/L (ref 3.5–5.1)
SODIUM: 139 mmol/L (ref 135–145)
TOTAL PROTEIN: 6.7 g/dL (ref 6.5–8.1)
Total Bilirubin: 0.9 mg/dL (ref 0.3–1.2)

## 2015-06-06 LAB — CBC WITH DIFFERENTIAL/PLATELET
BASOS ABS: 0 10*3/uL (ref 0.0–0.1)
BASOS PCT: 0 % (ref 0–1)
EOS ABS: 0.2 10*3/uL (ref 0.0–0.7)
EOS PCT: 3 % (ref 0–5)
HCT: 47.8 % (ref 39.0–52.0)
Hemoglobin: 15.8 g/dL (ref 13.0–17.0)
Lymphocytes Relative: 29 % (ref 12–46)
Lymphs Abs: 2 10*3/uL (ref 0.7–4.0)
MCH: 31.2 pg (ref 26.0–34.0)
MCHC: 33.1 g/dL (ref 30.0–36.0)
MCV: 94.3 fL (ref 78.0–100.0)
Monocytes Absolute: 0.3 10*3/uL (ref 0.1–1.0)
Monocytes Relative: 5 % (ref 3–12)
NEUTROS ABS: 4.2 10*3/uL (ref 1.7–7.7)
NEUTROS PCT: 63 % (ref 43–77)
Platelets: 206 10*3/uL (ref 150–400)
RBC: 5.07 MIL/uL (ref 4.22–5.81)
RDW: 14.3 % (ref 11.5–15.5)
WBC: 6.7 10*3/uL (ref 4.0–10.5)

## 2015-06-06 LAB — PROTIME-INR
INR: 1.1 (ref 0.00–1.49)
Prothrombin Time: 14.4 seconds (ref 11.6–15.2)

## 2015-06-14 MED ORDER — CEFAZOLIN SODIUM-DEXTROSE 2-3 GM-% IV SOLR
2.0000 g | INTRAVENOUS | Status: AC
  Administered 2015-06-15: 2 g via INTRAVENOUS
  Filled 2015-06-14: qty 50

## 2015-06-15 ENCOUNTER — Ambulatory Visit (HOSPITAL_COMMUNITY): Payer: Medicare Other | Admitting: Anesthesiology

## 2015-06-15 ENCOUNTER — Ambulatory Visit (HOSPITAL_COMMUNITY)
Admission: RE | Admit: 2015-06-15 | Discharge: 2015-06-15 | Disposition: A | Discharge status: Home / Self Care | Payer: Medicare Other | Source: Ambulatory Visit | Attending: General Surgery | Admitting: General Surgery

## 2015-06-15 ENCOUNTER — Encounter (HOSPITAL_COMMUNITY)
Admission: RE | Disposition: A | Discharge status: Home / Self Care | Payer: Self-pay | Source: Ambulatory Visit | Attending: General Surgery

## 2015-06-15 ENCOUNTER — Encounter (HOSPITAL_COMMUNITY): Payer: Self-pay | Admitting: *Deleted

## 2015-06-15 DIAGNOSIS — J45909 Unspecified asthma, uncomplicated: Secondary | ICD-10-CM | POA: Insufficient documentation

## 2015-06-15 DIAGNOSIS — K219 Gastro-esophageal reflux disease without esophagitis: Secondary | ICD-10-CM | POA: Insufficient documentation

## 2015-06-15 DIAGNOSIS — K4090 Unilateral inguinal hernia, without obstruction or gangrene, not specified as recurrent: Secondary | ICD-10-CM | POA: Diagnosis present

## 2015-06-15 DIAGNOSIS — I5022 Chronic systolic (congestive) heart failure: Secondary | ICD-10-CM | POA: Diagnosis not present

## 2015-06-15 DIAGNOSIS — I4891 Unspecified atrial fibrillation: Secondary | ICD-10-CM | POA: Insufficient documentation

## 2015-06-15 DIAGNOSIS — Z6829 Body mass index (BMI) 29.0-29.9, adult: Secondary | ICD-10-CM | POA: Insufficient documentation

## 2015-06-15 DIAGNOSIS — I110 Hypertensive heart disease with heart failure: Secondary | ICD-10-CM | POA: Insufficient documentation

## 2015-06-15 DIAGNOSIS — Z87891 Personal history of nicotine dependence: Secondary | ICD-10-CM | POA: Diagnosis not present

## 2015-06-15 DIAGNOSIS — Z7982 Long term (current) use of aspirin: Secondary | ICD-10-CM | POA: Diagnosis not present

## 2015-06-15 DIAGNOSIS — E669 Obesity, unspecified: Secondary | ICD-10-CM | POA: Insufficient documentation

## 2015-06-15 DIAGNOSIS — N289 Disorder of kidney and ureter, unspecified: Secondary | ICD-10-CM | POA: Diagnosis not present

## 2015-06-15 HISTORY — PX: INSERTION OF MESH: SHX5868

## 2015-06-15 HISTORY — PX: INGUINAL HERNIA REPAIR: SHX194

## 2015-06-15 SURGERY — REPAIR, HERNIA, INGUINAL, ADULT
Anesthesia: Regional | Site: Groin | Laterality: Right

## 2015-06-15 MED ORDER — BUPIVACAINE-EPINEPHRINE 0.5% -1:200000 IJ SOLN
INTRAMUSCULAR | Status: DC | PRN
  Administered 2015-06-15: 30 mL

## 2015-06-15 MED ORDER — FENTANYL CITRATE (PF) 100 MCG/2ML IJ SOLN
INTRAMUSCULAR | Status: DC | PRN
  Administered 2015-06-15: 50 ug via INTRAVENOUS
  Administered 2015-06-15 (×3): 25 ug via INTRAVENOUS

## 2015-06-15 MED ORDER — FENTANYL CITRATE (PF) 100 MCG/2ML IJ SOLN
INTRAMUSCULAR | Status: AC
  Filled 2015-06-15: qty 2

## 2015-06-15 MED ORDER — BUPIVACAINE-EPINEPHRINE (PF) 0.5% -1:200000 IJ SOLN
INTRAMUSCULAR | Status: AC
  Filled 2015-06-15: qty 30

## 2015-06-15 MED ORDER — LIDOCAINE HCL (CARDIAC) 20 MG/ML IV SOLN
INTRAVENOUS | Status: DC | PRN
  Administered 2015-06-15: 100 mg via INTRAVENOUS

## 2015-06-15 MED ORDER — FENTANYL CITRATE (PF) 100 MCG/2ML IJ SOLN
INTRAMUSCULAR | Status: AC
  Administered 2015-06-15: 50 ug via INTRAVENOUS
  Filled 2015-06-15: qty 2

## 2015-06-15 MED ORDER — GLYCOPYRROLATE 0.2 MG/ML IJ SOLN
INTRAMUSCULAR | Status: AC
  Filled 2015-06-15: qty 4

## 2015-06-15 MED ORDER — OXYCODONE HCL 5 MG/5ML PO SOLN: 5.0000 mg | Freq: Once | ORAL | Status: DC | PRN

## 2015-06-15 MED ORDER — PHENYLEPHRINE HCL 10 MG/ML IJ SOLN
INTRAMUSCULAR | Status: DC | PRN
  Administered 2015-06-15: 120 ug via INTRAVENOUS
  Administered 2015-06-15: 80 ug via INTRAVENOUS

## 2015-06-15 MED ORDER — OXYCODONE HCL 5 MG PO TABS: 5.0000 mg | ORAL_TABLET | Freq: Once | ORAL | Status: DC | PRN

## 2015-06-15 MED ORDER — ONDANSETRON HCL 4 MG/2ML IJ SOLN
INTRAMUSCULAR | Status: AC
  Filled 2015-06-15: qty 6

## 2015-06-15 MED ORDER — ROCURONIUM BROMIDE 50 MG/5ML IV SOLN
INTRAVENOUS | Status: AC
  Filled 2015-06-15: qty 2

## 2015-06-15 MED ORDER — EPHEDRINE SULFATE 50 MG/ML IJ SOLN
INTRAMUSCULAR | Status: AC
  Filled 2015-06-15: qty 3

## 2015-06-15 MED ORDER — LACTATED RINGERS IV SOLN
INTRAVENOUS | Status: DC | PRN
  Administered 2015-06-15 (×2): via INTRAVENOUS

## 2015-06-15 MED ORDER — SUCCINYLCHOLINE CHLORIDE 20 MG/ML IJ SOLN
INTRAMUSCULAR | Status: AC
  Filled 2015-06-15: qty 3

## 2015-06-15 MED ORDER — MIDAZOLAM HCL 2 MG/2ML IJ SOLN
INTRAMUSCULAR | Status: AC
  Filled 2015-06-15: qty 4

## 2015-06-15 MED ORDER — PROPOFOL 10 MG/ML IV BOLUS
INTRAVENOUS | Status: DC | PRN
  Administered 2015-06-15: 200 mg via INTRAVENOUS

## 2015-06-15 MED ORDER — LACTATED RINGERS IV SOLN
INTRAVENOUS | Status: DC
  Administered 2015-06-15: 09:00:00 via INTRAVENOUS

## 2015-06-15 MED ORDER — ARTIFICIAL TEARS OP OINT
TOPICAL_OINTMENT | OPHTHALMIC | Status: AC
  Filled 2015-06-15: qty 14

## 2015-06-15 MED ORDER — OXYCODONE HCL 5 MG PO TABS
5.0000 mg | ORAL_TABLET | ORAL | Status: DC | PRN
  Administered 2015-06-15: 10 mg via ORAL
  Filled 2015-06-15: qty 2

## 2015-06-15 MED ORDER — FENTANYL CITRATE (PF) 250 MCG/5ML IJ SOLN
INTRAMUSCULAR | Status: AC
  Filled 2015-06-15: qty 5

## 2015-06-15 MED ORDER — LIDOCAINE HCL (CARDIAC) 20 MG/ML IV SOLN
INTRAVENOUS | Status: AC
  Filled 2015-06-15: qty 15

## 2015-06-15 MED ORDER — DEXAMETHASONE SODIUM PHOSPHATE 10 MG/ML IJ SOLN
INTRAMUSCULAR | Status: AC
  Filled 2015-06-15: qty 2

## 2015-06-15 MED ORDER — MIDAZOLAM HCL 2 MG/2ML IJ SOLN
INTRAMUSCULAR | Status: AC
  Administered 2015-06-15: 2 mg via INTRAVENOUS
  Filled 2015-06-15: qty 2

## 2015-06-15 MED ORDER — SODIUM CHLORIDE 0.9 % IJ SOLN
INTRAMUSCULAR | Status: AC
  Filled 2015-06-15: qty 30

## 2015-06-15 MED ORDER — OXYCODONE HCL 5 MG PO TABS: 5.0000 mg | ORAL_TABLET | ORAL | Status: DC | PRN

## 2015-06-15 MED ORDER — NEOSTIGMINE METHYLSULFATE 10 MG/10ML IV SOLN
INTRAVENOUS | Status: AC
  Filled 2015-06-15: qty 1

## 2015-06-15 MED ORDER — ARTIFICIAL TEARS OP OINT
TOPICAL_OINTMENT | OPHTHALMIC | Status: AC
  Filled 2015-06-15: qty 3.5

## 2015-06-15 MED ORDER — 0.9 % SODIUM CHLORIDE (POUR BTL) OPTIME
TOPICAL | Status: DC | PRN
  Administered 2015-06-15: 1000 mL

## 2015-06-15 MED ORDER — OXYCODONE HCL 5 MG PO TABS
ORAL_TABLET | ORAL | Status: DC
Start: 2015-06-15 — End: 2015-06-15
  Filled 2015-06-15: qty 2

## 2015-06-15 MED ORDER — FENTANYL CITRATE (PF) 100 MCG/2ML IJ SOLN
25.0000 ug | INTRAMUSCULAR | Status: DC | PRN
  Administered 2015-06-15 (×2): 25 ug via INTRAVENOUS
  Administered 2015-06-15: 50 ug via INTRAVENOUS

## 2015-06-15 MED ORDER — PROMETHAZINE HCL 25 MG/ML IJ SOLN: 6.2500 mg | INTRAMUSCULAR | Status: DC | PRN

## 2015-06-15 MED ORDER — PHENYLEPHRINE 40 MCG/ML (10ML) SYRINGE FOR IV PUSH (FOR BLOOD PRESSURE SUPPORT)
PREFILLED_SYRINGE | INTRAVENOUS | Status: AC
  Filled 2015-06-15: qty 30

## 2015-06-15 SURGICAL SUPPLY — 57 items
APL SKNCLS STERI-STRIP NONHPOA (GAUZE/BANDAGES/DRESSINGS) ×1
BENZOIN TINCTURE PRP APPL 2/3 (GAUZE/BANDAGES/DRESSINGS) ×3 IMPLANT
BLADE SURG 10 STRL SS (BLADE) ×3 IMPLANT
BLADE SURG 15 STRL LF DISP TIS (BLADE) ×1 IMPLANT
BLADE SURG 15 STRL SS (BLADE) ×3
BLADE SURG ROTATE 9660 (MISCELLANEOUS) IMPLANT
CHLORAPREP W/TINT 26ML (MISCELLANEOUS) ×3 IMPLANT
CLOSURE STERI-STRIP 1/2X4 (GAUZE/BANDAGES/DRESSINGS) ×1
CLOSURE WOUND 1/2 X4 (GAUZE/BANDAGES/DRESSINGS) ×1
CLSR STERI-STRIP ANTIMIC 1/2X4 (GAUZE/BANDAGES/DRESSINGS) ×1 IMPLANT
COVER SURGICAL LIGHT HANDLE (MISCELLANEOUS) ×3 IMPLANT
DRAIN PENROSE 1/2X12 LTX STRL (WOUND CARE) IMPLANT
DRAPE INCISE IOBAN 66X45 STRL (DRAPES) ×3 IMPLANT
DRAPE LAPAROTOMY TRNSV 102X78 (DRAPE) ×3 IMPLANT
DRAPE UTILITY XL STRL (DRAPES) ×6 IMPLANT
DRSG TEGADERM 4X4.75 (GAUZE/BANDAGES/DRESSINGS) ×3 IMPLANT
DRSG TELFA 3X8 NADH (GAUZE/BANDAGES/DRESSINGS) ×3 IMPLANT
ELECT CAUTERY BLADE 6.4 (BLADE) ×3 IMPLANT
ELECT REM PT RETURN 9FT ADLT (ELECTROSURGICAL) ×3
ELECTRODE REM PT RTRN 9FT ADLT (ELECTROSURGICAL) ×1 IMPLANT
GAUZE SPONGE 4X4 16PLY XRAY LF (GAUZE/BANDAGES/DRESSINGS) ×3 IMPLANT
GLOVE BIO SURGEON STRL SZ7.5 (GLOVE) ×4 IMPLANT
GLOVE BIOGEL PI IND STRL 7.5 (GLOVE) IMPLANT
GLOVE BIOGEL PI IND STRL 8 (GLOVE) ×1 IMPLANT
GLOVE BIOGEL PI INDICATOR 7.5 (GLOVE) ×2
GLOVE BIOGEL PI INDICATOR 8 (GLOVE) ×4
GLOVE ECLIPSE 8.0 STRL XLNG CF (GLOVE) ×5 IMPLANT
GOWN STRL REUS W/ TWL LRG LVL3 (GOWN DISPOSABLE) ×2 IMPLANT
GOWN STRL REUS W/TWL LRG LVL3 (GOWN DISPOSABLE) ×6
KIT BASIN OR (CUSTOM PROCEDURE TRAY) ×3 IMPLANT
KIT ROOM TURNOVER OR (KITS) ×3 IMPLANT
MESH HERNIA 3X6 (Mesh General) ×2 IMPLANT
NDL HYPO 25GX1X1/2 BEV (NEEDLE) ×1 IMPLANT
NEEDLE HYPO 25GX1X1/2 BEV (NEEDLE) ×3 IMPLANT
NS IRRIG 1000ML POUR BTL (IV SOLUTION) ×3 IMPLANT
PACK SURGICAL SETUP 50X90 (CUSTOM PROCEDURE TRAY) ×3 IMPLANT
PAD ARMBOARD 7.5X6 YLW CONV (MISCELLANEOUS) ×3 IMPLANT
PAD DRESSING TELFA 3X8 NADH (GAUZE/BANDAGES/DRESSINGS) ×1 IMPLANT
PENCIL BUTTON HOLSTER BLD 10FT (ELECTRODE) ×3 IMPLANT
SPECIMEN JAR SMALL (MISCELLANEOUS) IMPLANT
SPONGE LAP 18X18 X RAY DECT (DISPOSABLE) ×3 IMPLANT
STRIP CLOSURE SKIN 1/2X4 (GAUZE/BANDAGES/DRESSINGS) ×2 IMPLANT
SUT MON AB 4-0 PC3 18 (SUTURE) ×3 IMPLANT
SUT PROLENE 2 0 CT2 30 (SUTURE) ×6 IMPLANT
SUT SILK 2 0 SH (SUTURE) IMPLANT
SUT VIC AB 2-0 SH 18 (SUTURE) ×3 IMPLANT
SUT VIC AB 3-0 SH 27 (SUTURE) ×6
SUT VIC AB 3-0 SH 27X BRD (SUTURE) IMPLANT
SUT VIC AB 3-0 SH 27XBRD (SUTURE) ×1 IMPLANT
SUT VICRYL AB 3 0 TIES (SUTURE) ×3 IMPLANT
SYR BULB IRRIGATION 50ML (SYRINGE) ×2 IMPLANT
SYR CONTROL 10ML LL (SYRINGE) ×3 IMPLANT
TOWEL OR 17X24 6PK STRL BLUE (TOWEL DISPOSABLE) ×3 IMPLANT
TOWEL OR 17X26 10 PK STRL BLUE (TOWEL DISPOSABLE) ×3 IMPLANT
TUBE CONNECTING 12'X1/4 (SUCTIONS) ×1
TUBE CONNECTING 12X1/4 (SUCTIONS) ×1 IMPLANT
YANKAUER SUCT BULB TIP NO VENT (SUCTIONS) ×2 IMPLANT

## 2015-06-15 NOTE — Anesthesia Procedure Notes (Signed)
Procedure Name: LMA Insertion Date/Time: 06/15/2015 9:41 AM Performed by: Carmela Rima Pre-anesthesia Checklist: Patient being monitored, Suction available, Emergency Drugs available, Patient identified and Timeout performed Patient Re-evaluated:Patient Re-evaluated prior to inductionOxygen Delivery Method: Circle system utilized Preoxygenation: Pre-oxygenation with 100% oxygen Intubation Type: IV induction Ventilation: Mask ventilation without difficulty LMA: LMA inserted LMA Size: 4.0 Number of attempts: 1 Placement Confirmation: positive ETCO2 Tube secured with: Tape Dental Injury: Teeth and Oropharynx as per pre-operative assessment

## 2015-06-15 NOTE — Interval H&P Note (Signed)
History and Physical Interval Note:  06/15/2015 9:00 AM  Tyler Burns  has presented today for surgery, with the diagnosis of Right Inguinal Hernia  The various methods of treatment have been discussed with the patient and family.  Cardiac clearance has been received.  After consideration of risks, benefits and other options for treatment, the patient has consented to  Procedure(s): RIGHT INGUINAL HERNIA REPAIR WITH MESH (Right) INSERTION OF MESH (Right) as a surgical intervention .  The patient's history has been reviewed, patient examined, no change in status, stable for surgery.  I have reviewed the patient's chart and labs.  Questions were answered to the patient's satisfaction.     Marjory Meints Shela Commons

## 2015-06-15 NOTE — Anesthesia Postprocedure Evaluation (Signed)
Anesthesia Post Note  Patient: Tyler Burns  Procedure(s) Performed: Procedure(s) (LRB): RIGHT INGUINAL HERNIA REPAIR WITH MESH (Right) INSERTION OF MESH (Right)  Anesthesia type: General  Patient location: PACU  Post pain: Pain level controlled  Post assessment: Post-op Vital signs reviewed  Last Vitals: BP 154/101 mmHg  Pulse 78  Temp(Src) 36.7 C (Oral)  Resp 18  Wt 220 lb (99.791 kg)  SpO2 98%  Post vital signs: Reviewed  Level of consciousness: sedated  Complications: No apparent anesthesia complications

## 2015-06-15 NOTE — H&P (View-Only) (Signed)
Tyler FritzDewey Burns 03/20/2015 10:05 AM [facility] Patient #: [id number] DOB: [birth year] Divorced / Language: [place] / Race: White Male  History of Present Illness Tyler Burns(Arya Luttrull J. Khyler Urda MD; 03/20/2015 11:30 AM) Patient words: hernia.  The patient is a 59 year old male   Note:He was referred by Dr. [name] for further evaluation and treatment of a right inguinal hernia. About 4 months ago, he began noticing discomfort in his right groin and testicle and a small amount of swelling there. He was referred to Dr. [name] because of an elevated PSA. On physical exam, he was noted to have a right inguinal hernia. He states it bothers him when he standing for a long period of time. No difficulty with urination. No difficulty with constipation. He is disabled and does not do a lot of heavy lifting. He is unaware of any family history of inguinal hernia. His girlfriend lives with him.   Other Problems [name]([name], CMA; 03/20/2015 10:06 AM) Gastroesophageal Reflux Disease  Past Surgical History [name]([name], CMA; 03/20/2015 10:06 AM) Vasectomy  Diagnostic Studies History [name]([name], [place]; 03/20/2015 10:06 AM) Colonoscopy never  Allergies [name]([name], CMA; 03/20/2015 10:06 AM) No Known Allergies05/24/2016  Medication History [name]([name], CMA; 03/20/2015 10:07 AM) Metoprolol Succinate ER (25MG  Tablet ER 24HR, Oral) Active. Lovastatin (40MG  Tablet, Oral) Active. Lisinopril (20MG  Tablet, Oral) Active. Hydrocodone-Acetaminophen (5-325MG  Tablet, Oral) Active. Furosemide (40MG  Tablet, Oral) Active. Lactulose (10GM/15ML Solution, Oral) Active. ZyrTEC Allergy (10MG  Tablet, Oral) Active. Aspirin (325MG  Tablet, Oral) Active. Medications Reconciled  Social History [name]([name], [place]; 03/20/2015 10:06 AM) Alcohol use Remotely quit alcohol use. Caffeine use Carbonated beverages, Coffee, Tea. No drug use Tobacco use Former smoker.  Family History [name]([name], [place];  03/20/2015 10:06 AM) Diabetes Mellitus Brother, Sister. Heart Disease Father, Mother. Kidney Disease Mother, Sister.  Review of Systems [name]([name] CMA; 03/20/2015 10:06 AM) General Not Present- Appetite Loss, Chills, Fatigue, Fever, Night Sweats, Weight Gain and Weight Loss. Skin Not Present- Change in Wart/Mole, Dryness, Hives, Jaundice, New Lesions, Non-Healing Wounds, Rash and Ulcer. HEENT Not Present- Earache, Hearing Loss, Hoarseness, Nose Bleed, Oral Ulcers, Ringing in the Ears, Seasonal Allergies, Sinus Pain, Sore Throat, Visual Disturbances, Wears glasses/contact lenses and Yellow Eyes. Respiratory Not Present- Bloody sputum, Chronic Cough, Difficulty Breathing, Snoring and Wheezing. Breast Not Present- Breast Mass, Breast Pain, Nipple Discharge and Skin Changes. Cardiovascular Not Present- Chest Pain, Difficulty Breathing Lying Down, Leg Cramps, Palpitations, Rapid Heart Rate, Shortness of Breath and Swelling of Extremities. Gastrointestinal Not Present- Abdominal Pain, Bloating, Bloody Stool, Change in Bowel Habits, Chronic diarrhea, Constipation, Difficulty Swallowing, Excessive gas, Gets full quickly at meals, Hemorrhoids, Indigestion, Nausea, Rectal Pain and Vomiting. Male Genitourinary Not Present- Blood in Urine, Change in Urinary Stream, Frequency, Impotence, Nocturia, Painful Urination, Urgency and Urine Leakage. Neurological Not Present- Decreased Memory, Fainting, Headaches, Numbness, Seizures, Tingling, Tremor, Trouble walking and Weakness. Psychiatric Not Present- Anxiety, Bipolar, Change in Sleep Pattern, Depression, Fearful and Frequent crying. Endocrine Not Present- Cold Intolerance, Excessive Hunger, Hair Changes, Heat Intolerance, Hot flashes and New Diabetes. Hematology Not Present- Easy Bruising, Excessive bleeding, Gland problems, HIV and Persistent Infections.   Vitals [name]([name] CMA; 03/20/2015 10:08 AM) 03/20/2015 10:07 AM Weight: 214 lb Height:  72in Body Surface Area: 2.22 m Body Mass Index: 29.02 kg/m Temp.: 97.61F(Oral)  Pulse: 80 (Regular)  Resp.: 17 (Unlabored)  BP: 142/80 (Sitting, Left Arm, Standard)    Physical Exam Tyler Burns(Reed Dady J. Carnetta Losada MD; 03/20/2015 11:37 AM) The physical exam findings are as follows: Note:General:  Overweight male in NAD. Pleasant and cooperative.  CV: Irregular rate and rhythm.  CHEST: Breath sounds equal and clear. Respirations nonlabored.  ABDOMEN: Soft, nontender, small reducible umbilical hernia.  GU: Tender, reducible right inguinal bulge. No left inguinal bulge.  MUSCULOSKELETAL: FROM, good muscle tone, no edema, no venous stasis changes  SKIN: No jaundice.  NEUROLOGIC: Alert and oriented, answers questions appropriately, normal gait and station.  PSYCHIATRIC: Normal mood, affect , and behavior.    Assessment & Plan Tyler Burns(Amelda Hapke J. Helena Sardo MD; 03/20/2015 11:40 AM) RIGHT INGUINAL HERNIA (550.90  K40.90) Impression: The hernia is symptomatic. He also has atrial fibrillation and missed his follow-up appointment with Dr. [name]. The last time he saw Dr. [name] was January 2015.  Plan: Referral to cardiology for preoperative assessment and his routine follow-up. I recommended an open right inguinal hernia repair with mesh following that. I have explained the procedure, risks, and aftercare of inguinal hernia repair. Risks include but are not limited to bleeding, infection, wound problems, anesthesia, recurrence, bladder or intestine injury, urinary retention, testicular dysfunction, chronic pain, mesh problems. He seems to understand and agrees with the plan. We'll wait for his visit with Dr. [name].  Avel Peaceodd Jazion Atteberry, MD

## 2015-06-15 NOTE — Discharge Instructions (Signed)
[facility] _______[facility]   INGUINAL HERNIA REPAIR: POST OP INSTRUCTIONS  Always review your discharge instruction sheet given to you by the facility where your surgery was performed. IF YOU HAVE DISABILITY OR FAMILY LEAVE FORMS, YOU MUST BRING THEM TO THE OFFICE FOR PROCESSING.   DO NOT GIVE THEM TO YOUR DOCTOR.  1. A  prescription for pain medication may be given to you upon discharge.  Take your pain medication as prescribed, if needed.  If narcotic pain medicine is not needed, then you may take acetaminophen (Tylenol) or ibuprofen (Advil) as needed. 2. Take your usually prescribed medications unless otherwise directed. 3. If you need a refill on your pain medication, please contact your pharmacy.  They will contact our office to request authorization. Prescriptions will not be filled after 5 pm or on week-ends. 4. You should follow a light diet the first 24 hours after arrival home, such as soup and crackers, etc.  Be sure to include lots of fluids daily.  Resume your normal diet the day after surgery. 5. Most patients will experience some swelling and bruising in the groin and scrotum.  Ice packs and reclining will help.  Swelling and bruising can take several days to resolve.  6. It is common to experience some constipation if taking pain medication after surgery.  Increasing fluid intake and taking a stool softener (such as Colace) will usually help or prevent this problem from occurring.  A mild laxative (Milk of Magnesia or Miralax) should be taken according to package directions if there are no bowel movements after 48 hours. 7. Unless discharge instructions indicate otherwise, you may remove your bandages 72 hours after surgery, and you may shower at that time.  You may have steri-strips (small skin tapes) in place directly over the incision.  These strips should be left on the skin.  If your surgeon used skin glue on the incision, you may shower in 24 hours.  The glue will  flake off over the next 2-3 weeks.  Any sutures or staples will be removed at the office during your follow-up visit. 8. ACTIVITIES:  You may resume regular (light) daily activities beginning the next day--such as daily self-care, walking, climbing stairs--gradually increasing activities as tolerated.  You may have sexual intercourse when it is comfortable.  Refrain from any heavy lifting or straining-nothing over 10 pounds for 6 weeks.  a. You may drive when you are no longer taking prescription pain medication, you can comfortably wear a seatbelt, and you can safely maneuver your car and apply brakes. b. RETURN TO WORK:  Light work only in 1-2 weeks.__________________________________________________________ 9. You should see your doctor in the office for a follow-up appointment approximately 2-3 weeks after your surgery.  Make sure that you call for this appointment within a day or two after you arrive home to insure a convenient appointment time. 10. OTHER INSTRUCTIONS:  __________________________________________________________________________________________________________________________________________________________________________________________  WHEN TO CALL YOUR DOCTOR: 1. Fever over 101.0 2. Inability to urinate 3. Nausea and/or vomiting 4. Extreme swelling or bruising 5. Continued bleeding from incision. 6. Increased pain, redness, or drainage from the incision  The clinic staff is available to answer your questions during regular business hours.  Please dont hesitate to call and ask to speak to one of the nurses for clinical concerns.  If you have a medical emergency, go to the nearest emergency room or call 911.  A surgeon from [facility] is always on call at the hospital   [address], [address], [place], [place]  [address] ? ° [address] [address], [place], [place]   [address] °[phone] ? [phone] ? FAX [phone] °Web site:  [email or web address] ° °

## 2015-06-15 NOTE — Op Note (Signed)
OPERATIVE NOTE-  Preoperative diagnosis:  Right inguinal hernia.  Postoperative diagnosis:  Same  Procedure:  Right inguinal hernia repair with mesh.  Surgeon:  Avel Peace, M.D.  Anesthesia:  General/LMA with local (Marcaine).  Indication:  This is a 59 year old male with a increasing right inguinal bulge consistent with a hernia on exam. He is undergoing preoperative cardiac evaluation and it is safe to proceed with right inguinal hernia repair with mesh.  Technique:  He was seen in the holding room and the right groin was marked with my initials. The patient was brought to the operating, placed supine on the operating table, and the anesthetic was administered by the anesthesiologist. The hair in the groin area was clipped as was felt to be necessary. This area was then sterilely prepped and draped.  Local anesthetic was infiltrated in the superficial and deep tissues in the right groin.  An incision was made through the skin and subcutaneous tissue until the external oblique aponeurosis was identified.  Local anesthetic was infiltrated deep to the external oblique aponeurosis. The external oblique aponeurosis was divided through the external ring medially and back toward the anterior superior iliac spine laterally. Using blunt dissection, the shelving edge of the inguinal ligament was identified inferiorly and the internal oblique aponeurosis and muscle were identified superiorly. The ilioinguinal nerve was identified and preserved.  The spermatic cord was isolated and a posterior window was made around it. An indirect hernia sac was identified and separated from the spermatic cord using blunt dissection. The hernia sac and its contents were reduced through the indirect hernia defect.   A piece of 3" x 6" polypropylene mesh was brought into the field and anchored 1-2 cm medial to the pubic tubercle with 2-0 Prolene suture. The inferior aspect of the mesh was anchored to the shelving edge  of the inguinal ligament with running 2-0 Prolene suture to a level 1-2 cm lateral to the internal ring. A slit was cut in the mesh creating 2 tails. These were wrapped around the spermatic cord. The superior aspect of the mesh was anchored to the internal oblique aponeurosis and muscle with interrupted 2-0 Vicryl sutures. The 2 tails of the mesh were then crossed creating a new internal ring and were anchored to the shelving edge of the inguinal ligament with 2-0 Prolene suture. The tip of a hemostat could be placed through the new aperture. The lateral aspect of the mesh was then tucked deep to the external oblique aponeurosis.  The wound was inspected and hemostasis was adequate. The external oblique aponeurosis was then closed over the mesh and cord with running 3-0 Vicryl suture. The subcutaneous tissue was closed with running 3-0 Vicryl suture. The skin closed with a running 4-0 Monocryl subcuticular stitch.  Steri-Strips and a sterile dressing were applied.  The procedure was well-tolerated without any apparent complications and he was taken to the recovery room in satisfactory condition.

## 2015-06-15 NOTE — Progress Notes (Signed)
Called Short Stay --waiting on return call with assignment

## 2015-06-15 NOTE — Transfer of Care (Signed)
Immediate Anesthesia Transfer of Care Note  Patient: Tyler Burns  Procedure(s) Performed: Procedure(s): RIGHT INGUINAL HERNIA REPAIR WITH MESH (Right) INSERTION OF MESH (Right)  Patient Location: PACU  Anesthesia Type:General  Level of Consciousness: awake, alert  and oriented  Airway & Oxygen Therapy: Patient Spontanous Breathing and Patient connected to face mask oxygen  Post-op Assessment: Report given to RN, Post -op Vital signs reviewed and stable and Patient moving all extremities X 4  Post vital signs: Reviewed and stable  Last Vitals:  Filed Vitals:   06/15/15 1055  BP:   Pulse:   Temp: 36.4 C  Resp:     Complications: No apparent anesthesia complications

## 2015-06-15 NOTE — Anesthesia Preprocedure Evaluation (Addendum)
Anesthesia Evaluation  Patient identified by MRN, date of birth, ID band Patient awake    Reviewed: Allergy & Precautions, H&P , NPO status , Patient's Chart, lab work & pertinent test results  History of Anesthesia Complications Negative for: history of anesthetic complications  Airway Mallampati: II  TM Distance: >3 FB Neck ROM: full    Dental no notable dental hx.    Pulmonary asthma , former smoker,  breath sounds clear to auscultation  Pulmonary exam normal       Cardiovascular hypertension, +CHF (systolic, EF 45-50%) Normal cardiovascular exam+ dysrhythmias (rate controlled, only ASA as CHADS2 score is 1) Atrial Fibrillation Rhythm:regular Rate:Normal     Neuro/Psych negative neurological ROS     GI/Hepatic negative GI ROS, Neg liver ROS,   Endo/Other  negative endocrine ROS  Renal/GU Renal disease     Musculoskeletal   Abdominal (+) + obese,   Peds  Hematology negative hematology ROS (+)   Anesthesia Other Findings 05/03/15 stress test, followed regularly by cardiology     Nuclear stress EF: 54%.     There was no ST segment deviation noted during stress.     Findings consistent with ischemia.     This is a low risk study.     The left ventricular ejection fraction is mildly decreased (45-54%). Small area of moderate inferoseptal ischemia at mid and basal level and apical thinning EF normal 54% Baseline ECG afib  Cardiology would not pursue cath given low risk study  Reproductive/Obstetrics negative OB ROS                            Anesthesia Physical Anesthesia Plan  ASA: III  Anesthesia Plan: General and Regional   Post-op Pain Management: GA combined w/ Regional for post-op pain   Induction: Intravenous  Airway Management Planned: LMA  Additional Equipment: None  Intra-op Plan:   Post-operative Plan:   Informed Consent: I have reviewed the patients History  and Physical, chart, labs and discussed the procedure including the risks, benefits and alternatives for the proposed anesthesia with the patient or authorized representative who has indicated his/her understanding and acceptance.   Dental advisory given  Plan Discussed with: CRNA  Anesthesia Plan Comments: (preop TAP block, LMA general)      Anesthesia Quick Evaluation

## 2015-06-17 ENCOUNTER — Encounter (HOSPITAL_COMMUNITY): Payer: Self-pay | Admitting: General Surgery

## 2015-07-09 ENCOUNTER — Encounter: Payer: Self-pay | Admitting: Interventional Cardiology

## 2015-07-09 ENCOUNTER — Ambulatory Visit (INDEPENDENT_AMBULATORY_CARE_PROVIDER_SITE_OTHER): Payer: Medicare Other | Admitting: Interventional Cardiology

## 2015-07-09 VITALS — BP 106/82 | HR 92 | Ht 72.0 in | Wt 216.8 lb

## 2015-07-09 DIAGNOSIS — I481 Persistent atrial fibrillation: Secondary | ICD-10-CM

## 2015-07-09 DIAGNOSIS — I4819 Other persistent atrial fibrillation: Secondary | ICD-10-CM

## 2015-07-09 DIAGNOSIS — N183 Chronic kidney disease, stage 3 unspecified: Secondary | ICD-10-CM | POA: Insufficient documentation

## 2015-07-09 NOTE — Progress Notes (Signed)
Patient ID: Tyler Burns, male   DOB: [birth year], 59 y.o.   MRN: [id number]     Cardiology Office Note   Date:  07/09/2015   ID:  Tyler Burns, DOB 1956/06/28, MRN [id number]  PCP:  Quitman Livings, MD    No chief complaint on file. AFib   Wt Readings from Last 3 Encounters:  07/09/15 216 lb 12.8 oz (98.34 kg)  06/15/15 220 lb (99.791 kg)  06/06/15 220 lb 3.2 oz (99.882 kg)       History of Present Illness: Tyler Burns is a 59 y.o. male   with a hx of chronic atrial fibrillation, CKD, mildly depressed LV function. CHADS2-VASc=1 (sCHF). He has been treated with ASA only.   He had a R inguinal hernia. He had surgical open repair with Dr. Avel Peace in 8/16. No cardiac issues with surgery. He had a low risk stress test, that showed a small area of ischemia.  This was medically managed.  No chest pain.  His activity is significantly limited by his chronic back pain. He cannot achieve 4 METs. He denies chest pain or dyspnea with minimal activity. He denies syncope. He denies orthopnea, PND, edema. He has a remote hx of smoking. He is treated for HL. He has a FHx of CAD (brother, mother with prior MI; sister died with CHF). He sleeps poorly due to recurrent back pain.      Past Medical History  Diagnosis Date  . Asthma   . History of cardiovascular stress test     Myoview 7/16:  EF 54%, small area of moderate inferoseptal ischemia, apical thinning, Low Risk  . Hypertension   . Dysrhythmia   . Glaucoma     Past Surgical History  Procedure Laterality Date  . Appendectomy    . Inguinal hernia repair Right 06/15/2015    Procedure: RIGHT INGUINAL HERNIA REPAIR WITH MESH;  Surgeon: Avel Peace, MD;  Location: [facility] OR;  Service: General;  Laterality: Right;  . Insertion of mesh Right 06/15/2015    Procedure: INSERTION OF MESH;  Surgeon: Avel Peace, MD;  Location: [facility] OR;  Service: General;  Laterality: Right;     Current Outpatient Prescriptions  Medication  Sig Dispense Refill  . aspirin EC 325 MG tablet Take 1 tablet (325 mg total) by mouth daily. 30 tablet 0  . bimatoprost (LUMIGAN) 0.01 % SOLN Place 1 drop into both eyes 2 (two) times daily.     . brimonidine (ALPHAGAN P) 0.1 % SOLN Place 1 drop into both eyes daily.     . cetirizine (ZYRTEC) 10 MG tablet Take 1 tablet (10 mg total) by mouth daily. One tab daily for allergies 30 tablet 1  . cyanocobalamin (CVS VITAMIN B12) 2000 MCG tablet Take 2,000 mcg by mouth daily.     . furosemide (LASIX) 40 MG tablet Take 1 tablet (40 mg total) by mouth daily. 90 tablet 1  . HYDROcodone-acetaminophen (NORCO/VICODIN) 5-325 MG per tablet Take 1 tablet by mouth every 6 (six) hours as needed. FOR PAIN  0  . lactulose (CHRONULAC) 10 GM/15ML solution Take 10 g by mouth daily as needed for moderate constipation.   10  . lisinopril (PRINIVIL,ZESTRIL) 20 MG tablet Take 20 mg by mouth daily.   0  . lovastatin (MEVACOR) 20 MG tablet Take 20 mg by mouth at bedtime.    . metoprolol succinate (TOPROL XL) 25 MG 24 hr tablet Take 1 tablet (25 mg total) by mouth daily. 30 tablet 11  .  Omega-3 Fatty Acids (FISH OIL PO) Take 1 tablet by mouth daily.    Marland Kitchen oxyCODONE (OXY IR/ROXICODONE) 5 MG immediate release tablet Take 1-2 tablets (5-10 mg total) by mouth every 4 (four) hours as needed for moderate pain, severe pain or breakthrough pain. 40 tablet 0  . vitamin E 400 UNIT capsule Take 400 Units by mouth daily.      No current facility-administered medications for this visit.    Allergies:   Brinzolamide-brimonidine    Social History:  The patient  reports that he has quit smoking. He does not have any smokeless tobacco history on file. He reports that he does not drink alcohol or use illicit drugs.   Family History:  The patient's family history includes Congestive Heart Failure in his mother; Heart attack (age of onset: 71) in his brother; Heart attack (age of onset: 60) in his mother; Heart failure (age of onset: 16) in  his sister; Leukemia in his brother. There is no history of Stroke.    ROS:  Please see the history of present illness.   Otherwise, review of systems are positive for back pain.   All other systems are reviewed and negative.    PHYSICAL EXAM: VS:  BP 106/82 mmHg  Pulse 92  Ht 6' (1.829 m)  Wt 216 lb 12.8 oz (98.34 kg)  BMI 29.40 kg/m2  SpO2 92% , BMI Body mass index is 29.4 kg/(m^2). GEN: Well nourished, well developed, in no acute distress,  HEENT: normal Neck: no JVD, carotid bruits, or masses Cardiac: irregularly irregular; no murmurs, rubs, or gallops,no edema  Respiratory:  clear to auscultation bilaterally, normal work of breathing GI: soft, nontender, nondistended, + BS MS: no deformity or atrophy Skin: warm and dry, no rash Neuro:  Strength and sensation are intact, moves slowly Psych: euthymic mood, full affect   EKG:   The ekg ordered 7/6 demonstrates AFib, controlled ventricular response   Recent Labs: 06/06/2015: ALT 14*; BUN 18; Creatinine, Ser 1.37*; Hemoglobin 15.8; Platelets 206; Potassium 4.1; Sodium 139   Lipid Panel No results found for: CHOL, TRIG, HDL, CHOLHDL, VLDL, LDLCALC, LDLDIRECT   Other studies Reviewed: Additional studies/ records that were reviewed today with results demonstrating: Low risk stress with minimal ischemia:  LV EF 54% in 7/16.   ASSESSMENT AND PLAN:  1.  atrial fibrillation: CHADS2-VASc=1 at this time with hx of systolic HF/LV dysfunction.  EF had normalized on stress test.  No hx of HTN, vascular disease, diabetes or stroke. He is < 74 yo. His risk factors will change once he is 73 and we discussed the possible need for long term anticoagulation in the future. Continue current dose of beta-blocker. Continue ASA for now. continue to avoid stronger anticoagulation as long as possible per his  Request. 2.  chronic kidney disease: followed by his primary care doctor. Avoid nephrotoxic meds.    Current medicines are reviewed  at length with the patient today.  The patient concerns regarding his medicines were addressed.  The following changes have been made:  No change  Labs/ tests ordered today include:  No orders of the defined types were placed in this encounter.    Recommend 150 minutes/week of aerobic exercise Low fat, low carb, high fiber diet recommended  Disposition:   FU in  1 year   Delorise Jackson., MD  07/09/2015 10:31 AM    [facility] [address] [place], [place], [place]  [address] Phone: [phone]; Fax: [phone]

## 2015-07-09 NOTE — Patient Instructions (Signed)

## 2016-04-22 ENCOUNTER — Other Ambulatory Visit: Payer: Self-pay | Admitting: General Surgery

## 2016-04-22 DIAGNOSIS — G8929 Other chronic pain: Secondary | ICD-10-CM

## 2016-04-22 DIAGNOSIS — R1031 Right lower quadrant pain: Principal | ICD-10-CM

## 2016-05-02 ENCOUNTER — Ambulatory Visit
Admission: RE | Admit: 2016-05-02 | Discharge: 2016-05-02 | Disposition: A | Discharge status: Home / Self Care | Payer: Medicare Other | Source: Ambulatory Visit | Attending: General Surgery | Admitting: General Surgery

## 2016-05-02 DIAGNOSIS — G8929 Other chronic pain: Secondary | ICD-10-CM

## 2016-05-02 DIAGNOSIS — R1031 Right lower quadrant pain: Principal | ICD-10-CM
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
## 2016-05-02 MED ORDER — IOPAMIDOL (ISOVUE-300) INJECTION 61%
100.0000 mL | Freq: Once | INTRAVENOUS | Status: AC | PRN
  Administered 2016-05-02: 100 mL via INTRAVENOUS

## 2016-05-23 ENCOUNTER — Encounter: Payer: Self-pay | Admitting: Vascular Surgery

## 2016-05-28 ENCOUNTER — Encounter: Payer: Self-pay | Admitting: Vascular Surgery

## 2016-05-29 ENCOUNTER — Ambulatory Visit (INDEPENDENT_AMBULATORY_CARE_PROVIDER_SITE_OTHER): Payer: Medicare Other | Admitting: Vascular Surgery

## 2016-05-29 ENCOUNTER — Encounter: Payer: Self-pay | Admitting: Vascular Surgery

## 2016-05-29 VITALS — BP 127/84 | HR 63 | Temp 97.1°F | Resp 18 | Ht 72.0 in | Wt 230.5 lb

## 2016-05-29 DIAGNOSIS — I714 Abdominal aortic aneurysm, without rupture, unspecified: Secondary | ICD-10-CM

## 2016-05-29 NOTE — Progress Notes (Signed)
Vascular and Vein Specialist of [facility]  Patient name: Tyler Burns MRN: [id number] DOB: 10/16/1956 Sex: male  REASON FOR CONSULT: Evaluation of abdominal aortic aneurysm and bilateral common iliac artery aneurysms.  HPI: Tyler Burns is a 60 y.o. male, who is seen today for evaluation of recent discovery of abdominal and bilateral iliac artery aneurysms. He had right groin discomfort and underwent a CT scan of his pelvis. This revealed a 4.5 cm abdominal aortic aneurysm and 2 cm common iliac artery aneurysms bilaterally. Patient had no prior knowledge of this and has no family history of aneurysms. The CT scan did not extend to the level of the renal artery since this was a pelvic CT scan. He has no history of myocardial infarction. The former smoker having quit in 1994. Does have a history of severe degenerative disc disease in his back  Past Medical History:  Diagnosis Date  . Asthma   . Dysrhythmia   . Glaucoma   . History of cardiovascular stress test    Myoview 7/16:  EF 54%, small area of moderate inferoseptal ischemia, apical thinning, Low Risk  . Hypertension     Family History  Problem Relation Age of Onset  . Congestive Heart Failure Mother   . Heart attack Mother 70  . Heart attack Brother 45  . Leukemia Brother   . Heart failure Sister 75  . Stroke Neg Hx     SOCIAL HISTORY: Social History   Social History  . Marital status: Divorced    Spouse name: N/A  . Number of children: N/A  . Years of education: N/A   Occupational History  . Not on file.   Social History Main Topics  . Smoking status: Former Smoker    Quit date: 10/27/1992  . Smokeless tobacco: Never Used  . Alcohol use No  . Drug use: No  . Sexual activity: Not on file   Other Topics Concern  . Not on file   Social History Narrative  . No narrative on file    Allergies  Allergen Reactions  . Brinzolamide-Brimonidine Shortness Of Breath    Feels he has difficulty breathing when on  medication    Current Outpatient Prescriptions  Medication Sig Dispense Refill  . aspirin EC 325 MG tablet Take 1 tablet (325 mg total) by mouth daily. 30 tablet 0  . bimatoprost (LUMIGAN) 0.01 % SOLN Place 1 drop into both eyes 2 (two) times daily.     . brimonidine (ALPHAGAN P) 0.1 % SOLN Place 1 drop into both eyes daily.     . cetirizine (ZYRTEC) 10 MG tablet Take 1 tablet (10 mg total) by mouth daily. One tab daily for allergies 30 tablet 1  . cyanocobalamin (CVS VITAMIN B12) 2000 MCG tablet Take 2,000 mcg by mouth daily.     . furosemide (LASIX) 40 MG tablet Take 1 tablet (40 mg total) by mouth daily. 90 tablet 1  . hydrALAZINE (APRESOLINE) 50 MG tablet Take 50 mg by mouth 2 (two) times daily.    Marland Kitchen HYDROcodone-acetaminophen (NORCO/VICODIN) 5-325 MG per tablet Take 1 tablet by mouth every 6 (six) hours as needed. FOR PAIN  0  . lactulose (CHRONULAC) 10 GM/15ML solution Take 10 g by mouth daily as needed for moderate constipation.   10  . lisinopril (PRINIVIL,ZESTRIL) 20 MG tablet Take 20 mg by mouth daily.   0  . lovastatin (MEVACOR) 20 MG tablet Take 20 mg by mouth at bedtime.    . metoprolol  succinate (TOPROL XL) 25 MG 24 hr tablet Take 1 tablet (25 mg total) by mouth daily. 30 tablet 11  . Omega-3 Fatty Acids (FISH OIL PO) Take 1 tablet by mouth daily.    Marland Kitchen oxyCODONE (OXY IR/ROXICODONE) 5 MG immediate release tablet Take 1-2 tablets (5-10 mg total) by mouth every 4 (four) hours as needed for moderate pain, severe pain or breakthrough pain. 40 tablet 0  . vitamin E 400 UNIT capsule Take 400 Units by mouth daily.      No current facility-administered medications for this visit.     REVIEW OF SYSTEMS:   denotes positive finding,  denotes negative finding Cardiac  Comments:  Chest pain or chest pressure:    Shortness of breath upon exertion:    Short of breath when lying flat:    Irregular heart rhythm:        Vascular    Pain in calf, thigh, or hip brought on by  ambulation: x   Pain in feet at night that wakes you up from your sleep:     Blood clot in your veins:    Leg swelling:         Pulmonary    Oxygen at home:    Productive cough:     Wheezing:         Neurologic    Sudden weakness in arms or legs:     Sudden numbness in arms or legs:     Sudden onset of difficulty speaking or slurred speech:    Temporary loss of vision in one eye:     Problems with dizziness:         Gastrointestinal    Blood in stool:     Vomited blood:         Genitourinary    Burning when urinating:     Blood in urine:        Psychiatric    Major depression:         Hematologic    Bleeding problems:    Problems with blood clotting too easily:        Skin    Rashes or ulcers:        Constitutional    Fever or chills:      PHYSICAL EXAM: Vitals:   05/29/16 0938  BP: 127/84  Pulse: 63  Resp: 18  Temp: 97.1 F (36.2 C)  TempSrc: Oral  SpO2: 96%  Weight: 230 lb 8 oz (104.6 kg)  Height: 6' (1.829 m)    GENERAL: The patient is a well-nourished male, in no acute distress. The vital signs are documented above. CARDIAC: There is a regular rate and rhythm.  VASCULAR: 2+ radial and 2+ femoral pulses bilaterally. Diminished popliteal and do not palpate pedal pulses bilaterally. PULMONARY: There is good air exchange bilaterally without wheezing or rales. ABDOMEN: Soft and non-tender with normal pitched bowel sounds. Moderate obesity MUSCULOSKELETAL: There are no major deformities or cyanosis. NEUROLOGIC: No focal weakness or paresthesias are detected. SKIN: There are no ulcers or rashes noted. PSYCHIATRIC: The patient has a normal affect.  DATA:  I did review his CT scan with his actual films. Does show a 4.5 cm abdominal aortic aneurysm and 2 cm iliac artery aneurysms. No evidence of common femoral artery aneurysms and his superficial femoral arteries are patent in the proximal portion of these.  MEDICAL ISSUES: I discussed this at length with  the patient. Explained the natural history of aneurysms with slow continued growth typically.  Explained that if his age and health we would consider elective repair at the 5.0- 5.5 cm abdominal aortic aneurysm growth. So explained the significance of his iliac artery aneurysms as well. I did discuss open and stent graft repair of aneurysms if elective aneurysm was recommended. Also explain symptoms of leaking aneurysm and the explain the urgency to report to the emergency room via 911 emergency medical services if this occurred. He will see [place] again in 6 months with ultrasound follow-up at that time   Early, Tawanna Cooler [facility] of [facility] [employer] [facility] [phone]

## 2016-05-29 NOTE — Addendum Note (Signed)
Addended by: Sharee Pimple on: 05/29/2016 04:56 PM   Modules accepted: Orders

## 2016-05-30 ENCOUNTER — Encounter: Payer: Medicare Other | Admitting: Vascular Surgery

## 2016-11-25 ENCOUNTER — Encounter: Payer: Self-pay | Admitting: Vascular Surgery

## 2016-12-02 ENCOUNTER — Ambulatory Visit: Payer: Medicare Other | Admitting: Vascular Surgery

## 2016-12-02 ENCOUNTER — Ambulatory Visit (HOSPITAL_COMMUNITY): Payer: Medicare Other

## 2017-01-08 ENCOUNTER — Encounter: Payer: Self-pay | Admitting: Vascular Surgery

## 2017-01-20 ENCOUNTER — Ambulatory Visit (HOSPITAL_COMMUNITY)
Admission: RE | Admit: 2017-01-20 | Discharge: 2017-01-20 | Disposition: A | Discharge status: Home / Self Care | Payer: Medicare Other | Source: Ambulatory Visit | Attending: Vascular Surgery | Admitting: Vascular Surgery

## 2017-01-20 ENCOUNTER — Ambulatory Visit (INDEPENDENT_AMBULATORY_CARE_PROVIDER_SITE_OTHER): Payer: Medicare Other | Admitting: Vascular Surgery

## 2017-01-20 ENCOUNTER — Encounter: Payer: Self-pay | Admitting: Vascular Surgery

## 2017-01-20 VITALS — BP 149/103 | HR 84 | Temp 97.3°F | Resp 20 | Ht 72.0 in | Wt 245.0 lb

## 2017-01-20 DIAGNOSIS — I714 Abdominal aortic aneurysm, without rupture, unspecified: Secondary | ICD-10-CM

## 2017-01-20 NOTE — Progress Notes (Signed)
Vascular and Vein Specialist of [facility]  Patient name: Tyler Burns MRN: [id number] DOB: [birth year] Sex: male  REASON FOR VISIT: Follow-up abdominal aortic aneurysm  HPI: Tyler Burns is a 61 y.o. male here today for follow-up of abdominal aortic aneurysm. He continues to be completely asymptomatic from this. He does report that he is in chronic atrial fibrillation. He has declined anticoagulation for this mainly because his mother was on Coumadin and he did not 1 do the routine INR testing. I did explain that there are other treatment options and he will discuss this with his medical doctor. He specifically has no abdominal or back pain.  Past Medical History:  Diagnosis Date  . Asthma   . Dysrhythmia   . Glaucoma   . History of cardiovascular stress test    Myoview 7/16:  EF 54%, small area of moderate inferoseptal ischemia, apical thinning, Low Risk  . Hypertension     Family History  Problem Relation Age of Onset  . Congestive Heart Failure Mother   . Heart attack Mother 67  . Heart attack Brother 45  . Leukemia Brother   . Heart failure Sister 56  . Stroke Neg Hx     SOCIAL HISTORY: Social History  Substance Use Topics  . Smoking status: Former Smoker    Quit date: 10/27/1992  . Smokeless tobacco: Never Used  . Alcohol use No    Allergies  Allergen Reactions  . Brinzolamide-Brimonidine Shortness Of Breath    Feels he has difficulty breathing when on medication    Current Outpatient Prescriptions  Medication Sig Dispense Refill  . aspirin EC 325 MG tablet Take 1 tablet (325 mg total) by mouth daily. 30 tablet 0  . bimatoprost (LUMIGAN) 0.01 % SOLN Place 1 drop into both eyes 2 (two) times daily.     . brimonidine (ALPHAGAN P) 0.1 % SOLN Place 1 drop into both eyes daily.     . cetirizine (ZYRTEC) 10 MG tablet Take 1 tablet (10 mg total) by mouth daily. One tab daily for allergies 30 tablet 1  . cyanocobalamin (CVS VITAMIN  B12) 2000 MCG tablet Take 2,000 mcg by mouth daily.     . furosemide (LASIX) 40 MG tablet Take 1 tablet (40 mg total) by mouth daily. 90 tablet 1  . hydrALAZINE (APRESOLINE) 50 MG tablet Take 50 mg by mouth 2 (two) times daily.    Marland Kitchen HYDROcodone-acetaminophen (NORCO/VICODIN) 5-325 MG per tablet Take 1 tablet by mouth every 6 (six) hours as needed. FOR PAIN  0  . lactulose (CHRONULAC) 10 GM/15ML solution Take 10 g by mouth daily as needed for moderate constipation.   10  . lisinopril (PRINIVIL,ZESTRIL) 20 MG tablet Take 20 mg by mouth daily.   0  . lovastatin (MEVACOR) 20 MG tablet Take 20 mg by mouth at bedtime.    . metoprolol succinate (TOPROL XL) 25 MG 24 hr tablet Take 1 tablet (25 mg total) by mouth daily. 30 tablet 11  . Omega-3 Fatty Acids (FISH OIL PO) Take 1 tablet by mouth daily.    . vitamin E 400 UNIT capsule Take 400 Units by mouth daily.     Marland Kitchen oxyCODONE (OXY IR/ROXICODONE) 5 MG immediate release tablet Take 1-2 tablets (5-10 mg total) by mouth every 4 (four) hours as needed for moderate pain, severe pain or breakthrough pain. (Patient not taking: Reported on 01/20/2017) 40 tablet 0   No current facility-administered medications for this visit.     REVIEW  OF SYSTEMS:  [X]  denotes positive finding, [ ]  denotes negative finding Cardiac  Comments:  Chest pain or chest pressure:    Shortness of breath upon exertion: x   Short of breath when lying flat:    Irregular heart rhythm: x       Vascular    Pain in calf, thigh, or hip brought on by ambulation: x   Pain in feet at night that wakes you up from your sleep:     Blood clot in your veins:    Leg swelling:           PHYSICAL EXAM: Vitals:   01/20/17 0946 01/20/17 0948  BP: (!) 156/104 (!) 149/103  Pulse: 84   Resp: 20   Temp: 97.3 F (36.3 C)   TempSrc: Oral   SpO2: 98%   Weight: 245 lb (111.1 kg)   Height: 6' (1.829 m)     GENERAL: The patient is a well-nourished male, in no acute distress. The vital signs are  documented above. CARDIOVASCULAR: Do not palpate an aneurysm. Does have obesity. 2+ femoral pulses. 2+ radial pulses. PULMONARY: There is good air exchange  MUSCULOSKELETAL: There are no major deformities or cyanosis. NEUROLOGIC: No focal weakness or paresthesias are detected. SKIN: There are no ulcers or rashes noted. PSYCHIATRIC: The patient has a normal affect.  DATA:  Duplex shows no change from the CT scan from 6 months ago. His aneurysm maximal diameters proximal and 4.5 cm and his iliac arteries are just over 2 cm  MEDICAL ISSUES: Stable overall. Again reviewed symptoms of leaking aneurysm with him and he knows to present immediately to the hospital should this occur. We will see him again in one year with CT scan follow-up. His initial CT scan was of his pelvis only so we have never seen a evaluation of the aneurysm at the level the renal arteries. Assuming no significant change, would recommend a yearly duplex follow-up after    Larina Earthlyodd F. Javen Hinderliter, MD [facility] Office Tel [phone] Pager [phone]

## 2017-01-21 NOTE — Addendum Note (Signed)
Addended by: Burton ApleyPETTY, Aleigha Gilani A on: 01/21/2017 03:33 PM   Modules accepted: Orders

## 2017-07-30 ENCOUNTER — Encounter: Payer: Self-pay | Admitting: Urgent Care

## 2017-07-30 ENCOUNTER — Ambulatory Visit (INDEPENDENT_AMBULATORY_CARE_PROVIDER_SITE_OTHER): Payer: Medicare Other | Admitting: Urgent Care

## 2017-07-30 VITALS — BP 136/84 | HR 93 | Temp 97.5°F | Resp 16 | Ht 72.0 in | Wt 246.0 lb

## 2017-07-30 DIAGNOSIS — I10 Essential (primary) hypertension: Secondary | ICD-10-CM

## 2017-07-30 DIAGNOSIS — Z6833 Body mass index (BMI) 33.0-33.9, adult: Secondary | ICD-10-CM

## 2017-07-30 DIAGNOSIS — E669 Obesity, unspecified: Secondary | ICD-10-CM

## 2017-07-30 NOTE — Progress Notes (Signed)
  MRN: [id number] DOB: 25-Jun-1956  Subjective:   Tyler Burns is a 61 y.o. male presenting for chief complaint of Advice Only (patient would like some labs done for second opinion on kidney issues)  Reports that he would like to have repeat labs. He is upset that his PCP was considering a referral for biopsy of what I suspect was a kidney biopsy. He is on Lasix, lisinopril for HTN. He is skeptical of whether or not he needs to continue Lasix. Admits that he has had low potassium. Denies fever, chest pain, n/v, abdominal pain, oliguria, anuria, hematuria.   Tyler Burns has a current medication list which includes the following prescription(s): aspirin ec, bimatoprost, brimonidine, cetirizine, cyanocobalamin, furosemide, hydralazine, hydrocodone-acetaminophen, lisinopril, lovastatin, metoprolol succinate, omega-3 fatty acids, oxycodone, vitamin e, and lactulose. Also is allergic to brinzolamide-brimonidine.  Tyler Burns  has a past medical history of Asthma; Dysrhythmia; Glaucoma; History of cardiovascular stress test; and Hypertension. Also  has a past surgical history that includes Appendectomy; Inguinal hernia repair (Right, 06/15/2015); and Insertion of mesh (Right, 06/15/2015).  Objective:   Vitals: BP 136/84 (BP Location: Left Arm, Patient Position: Sitting, Cuff Size: Large)   Pulse 93   Temp (!) 97.5 F (36.4 C) (Oral)   Resp 16   Ht 6' (1.829 m)   Wt 246 lb (111.6 kg)   SpO2 94%   BMI 33.36 kg/m   BP Readings from Last 3 Encounters:  07/30/17 136/84  01/20/17 (!) 149/103  05/29/16 127/84   Physical Exam  Constitutional: He is oriented to person, place, and time. He appears well-developed and well-nourished.  Cardiovascular: Normal rate, regular rhythm and intact distal pulses.  Exam reveals no gallop and no friction rub.   No murmur heard. Pulmonary/Chest: No respiratory distress. He has no wheezes. He has no rales.  Musculoskeletal: He exhibits no edema.  Neurological: He is alert and  oriented to person, place, and time.  Skin: Skin is warm and dry.  Psychiatric:  Patient has very Personnel officer.   Assessment and Plan :   1. Essential hypertension 2. Class 1 obesity without serious comorbidity with body mass index (BMI) of 33.0 to 33.9 in adult, unspecified obesity type - Answered patient's questions regarding medical therapy for hypertension, side effects, risks of hypertension including chronic kidney disease and heart disease. Labs pending, will mail results out to patient.  Wallis Bamberg, PA-C Primary Care at [facility] [phone] 07/30/2017  12:03 PM

## 2017-07-30 NOTE — Patient Instructions (Addendum)
Hypertension Hypertension, commonly called high blood pressure, is when the force of blood pumping through the arteries is too strong. The arteries are the blood vessels that carry blood from the heart throughout the body. Hypertension forces the heart to work harder to pump blood and may cause arteries to become narrow or stiff. Having untreated or uncontrolled hypertension can cause heart attacks, strokes, kidney disease, and other problems. A blood pressure reading consists of a higher number over a lower number. Ideally, your blood pressure should be below 120/80. The first ("top") number is called the systolic pressure. It is a measure of the pressure in your arteries as your heart beats. The second ("bottom") number is called the diastolic pressure. It is a measure of the pressure in your arteries as the heart relaxes. What are the causes? The cause of this condition is not known. What increases the risk? Some risk factors for high blood pressure are under your control. Others are not. Factors you can change  Smoking.  Having type 2 diabetes mellitus, high cholesterol, or both.  Not getting enough exercise or physical activity.  Being overweight.  Having too much fat, sugar, calories, or salt (sodium) in your diet.  Drinking too much alcohol. Factors that are difficult or impossible to change  Having chronic kidney disease.  Having a family history of high blood pressure.  Age. Risk increases with age.  Race. You may be at higher risk if you are African-American.  Gender. Men are at higher risk than women before age 45. After age 65, women are at higher risk than men.  Having obstructive sleep apnea.  Stress. What are the signs or symptoms? Extremely high blood pressure (hypertensive crisis) may cause:  Headache.  Anxiety.  Shortness of breath.  Nosebleed.  Nausea and vomiting.  Severe chest pain.  Jerky movements you cannot control (seizures).  How is this  diagnosed? This condition is diagnosed by measuring your blood pressure while you are seated, with your arm resting on a surface. The cuff of the blood pressure monitor will be placed directly against the skin of your upper arm at the level of your heart. It should be measured at least twice using the same arm. Certain conditions can cause a difference in blood pressure between your right and left arms. Certain factors can cause blood pressure readings to be lower or higher than normal (elevated) for a short period of time:  When your blood pressure is higher when you are in a health care provider's office than when you are at home, this is called white coat hypertension. Most people with this condition do not need medicines.  When your blood pressure is higher at home than when you are in a health care provider's office, this is called masked hypertension. Most people with this condition may need medicines to control blood pressure.  If you have a high blood pressure reading during one visit or you have normal blood pressure with other risk factors:  You may be asked to return on a different day to have your blood pressure checked again.  You may be asked to monitor your blood pressure at home for 1 week or longer.  If you are diagnosed with hypertension, you may have other blood or imaging tests to help your health care provider understand your overall risk for other conditions. How is this treated? This condition is treated by making healthy lifestyle changes, such as eating healthy foods, exercising more, and reducing your alcohol intake. Your   health care provider may prescribe medicine if lifestyle changes are not enough to get your blood pressure under control, and if:  Your systolic blood pressure is above 130.  Your diastolic blood pressure is above 80.  Your personal target blood pressure may vary depending on your medical conditions, your age, and other factors. Follow these  instructions at home: Eating and drinking  Eat a diet that is high in fiber and potassium, and low in sodium, added sugar, and fat. An example eating plan is called the DASH (Dietary Approaches to Stop Hypertension) diet. To eat this way: ? Eat plenty of fresh fruits and vegetables. Try to fill half of your plate at each meal with fruits and vegetables. ? Eat whole grains, such as whole wheat pasta, brown rice, or whole grain bread. Fill about one quarter of your plate with whole grains. ? Eat or drink low-fat dairy products, such as skim milk or low-fat yogurt. ? Avoid fatty cuts of meat, processed or cured meats, and poultry with skin. Fill about one quarter of your plate with lean proteins, such as fish, chicken without skin, beans, eggs, and tofu. ? Avoid premade and processed foods. These tend to be higher in sodium, added sugar, and fat.  Reduce your daily sodium intake. Most people with hypertension should eat less than 1,500 mg of sodium a day.  Limit alcohol intake to no more than 1 drink a day for nonpregnant women and 2 drinks a day for men. One drink equals 12 oz of beer, 5 oz of wine, or 1 oz of hard liquor. Lifestyle  Work with your health care provider to maintain a healthy body weight or to lose weight. Ask what an ideal weight is for you.  Get at least 30 minutes of exercise that causes your heart to beat faster (aerobic exercise) most days of the week. Activities may include walking, swimming, or biking.  Include exercise to strengthen your muscles (resistance exercise), such as pilates or lifting weights, as part of your weekly exercise routine. Try to do these types of exercises for 30 minutes at least 3 days a week.  Do not use any products that contain nicotine or tobacco, such as cigarettes and e-cigarettes. If you need help quitting, ask your health care provider.  Monitor your blood pressure at home as told by your health care provider.  Keep all follow-up visits as  told by your health care provider. This is important. Medicines  Take over-the-counter and prescription medicines only as told by your health care provider. Follow directions carefully. Blood pressure medicines must be taken as prescribed.  Do not skip doses of blood pressure medicine. Doing this puts you at risk for problems and can make the medicine less effective.  Ask your health care provider about side effects or reactions to medicines that you should watch for. Contact a health care provider if:  You think you are having a reaction to a medicine you are taking.  You have headaches that keep coming back (recurring).  You feel dizzy.  You have swelling in your ankles.  You have trouble with your vision. Get help right away if:  You develop a severe headache or confusion.  You have unusual weakness or numbness.  You feel faint.  You have severe pain in your chest or abdomen.  You vomit repeatedly.  You have trouble breathing. Summary  Hypertension is when the force of blood pumping through your arteries is too strong. If this condition is not   controlled, it may put you at risk for serious complications.  Your personal target blood pressure may vary depending on your medical conditions, your age, and other factors. For most people, a normal blood pressure is less than 120/80.  Hypertension is treated with lifestyle changes, medicines, or a combination of both. Lifestyle changes include weight loss, eating a healthy, low-sodium diet, exercising more, and limiting alcohol. This information is not intended to replace advice given to you by your health care provider. Make sure you discuss any questions you have with your health care provider. Document Released: 10/13/2005 Document Revised: 09/10/2016 Document Reviewed: 09/10/2016 Elsevier Interactive Patient Education  2018 [employer]     IF you received an x-ray today, you will receive an invoice from [facility]. Please contact [facility] at [phone] with questions or concerns regarding your invoice.   IF you received labwork today, you will receive an invoice from [facility]. Please contact [facility] at [phone] with questions or concerns regarding your invoice.   Our billing staff will not be able to assist you with questions regarding bills from these companies.  You will be contacted with the lab results as soon as they are available. The fastest way to get your results is to activate your My Chart account. Instructions are located on the last page of this paperwork. If you have not heard from us regarding the results in 2 weeks, please contact this office.     

## 2017-07-31 LAB — COMPREHENSIVE METABOLIC PANEL WITH GFR
ALT: 14 [IU]/L (ref 0–44)
AST: 19 [IU]/L (ref 0–40)
Albumin/Globulin Ratio: 2 (ref 1.2–2.2)
Albumin: 4.7 g/dL (ref 3.6–4.8)
Alkaline Phosphatase: 58 [IU]/L (ref 39–117)
BUN/Creatinine Ratio: 16 (ref 10–24)
BUN: 18 mg/dL (ref 8–27)
Bilirubin Total: 0.8 mg/dL (ref 0.0–1.2)
CO2: 24 mmol/L (ref 20–29)
Calcium: 10.1 mg/dL (ref 8.6–10.2)
Chloride: 100 mmol/L (ref 96–106)
Creatinine, Ser: 1.12 mg/dL (ref 0.76–1.27)
GFR calc Af Amer: 80 mL/min/{1.73_m2}
GFR calc non Af Amer: 70 mL/min/{1.73_m2}
Globulin, Total: 2.4 g/dL (ref 1.5–4.5)
Glucose: 97 mg/dL (ref 65–99)
Potassium: 5.1 mmol/L (ref 3.5–5.2)
Sodium: 140 mmol/L (ref 134–144)
Total Protein: 7.1 g/dL (ref 6.0–8.5)

## 2017-08-06 ENCOUNTER — Encounter: Payer: Self-pay | Admitting: Urgent Care

## 2018-01-26 ENCOUNTER — Ambulatory Visit (INDEPENDENT_AMBULATORY_CARE_PROVIDER_SITE_OTHER): Payer: Medicare Other | Admitting: Vascular Surgery

## 2018-01-26 ENCOUNTER — Encounter: Payer: Self-pay | Admitting: Vascular Surgery

## 2018-01-26 ENCOUNTER — Other Ambulatory Visit: Payer: Self-pay

## 2018-01-26 ENCOUNTER — Ambulatory Visit
Admission: RE | Admit: 2018-01-26 | Discharge: 2018-01-26 | Disposition: A | Discharge status: Home / Self Care | Payer: Medicare Other | Source: Ambulatory Visit | Attending: Vascular Surgery | Admitting: Vascular Surgery

## 2018-01-26 VITALS — BP 131/79 | HR 80 | Temp 97.0°F | Resp 16 | Ht 72.0 in | Wt 239.0 lb

## 2018-01-26 DIAGNOSIS — I714 Abdominal aortic aneurysm, without rupture, unspecified: Secondary | ICD-10-CM
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
## 2018-01-26 MED ORDER — IOPAMIDOL (ISOVUE-370) INJECTION 76%
75.0000 mL | Freq: Once | INTRAVENOUS | Status: AC | PRN
Start: 2018-01-26 — End: 2018-01-26
  Administered 2018-01-26: 75 mL via INTRAVENOUS

## 2018-01-26 NOTE — Progress Notes (Signed)
[facility]ist of [facility]  Patient name: Tyler Burns MRN: [id number] DOB: 1956/02/22 Sex: male  REASON FOR VISIT: Follow-up abdominal aortic aneurysm  HPI: KAULANA BRINDLE is a 62 y.o. male here today for follow-up.  He has known history of abdominal aortic aneurysm is seen today for CT scan follow-up.  His last study was only of his pelvis and he is here today for discussion of recent CT abdomen and pelvis to determine the proximal extent of his aneurysmal disease.  He has no symptoms referable to this.  Has been stable with no new cardiac difficulties.  Past Medical History:  Diagnosis Date  . Asthma   . Dysrhythmia   . Glaucoma   . History of cardiovascular stress test    Myoview 7/16:  EF 54%, small area of moderate inferoseptal ischemia, apical thinning, Low Risk  . Hypertension     Family History  Problem Relation Age of Onset  . Congestive Heart Failure Mother   . Heart attack Mother 23  . Heart attack Brother 45  . Leukemia Brother   . Heart failure Sister 62  . Stroke Neg Hx     SOCIAL HISTORY: Social History   Tobacco Use  . Smoking status: Former Smoker    Last attempt to quit: 10/27/1992    Years since quitting: 25.2  . Smokeless tobacco: Never Used  Substance Use Topics  . Alcohol use: No    Allergies  Allergen Reactions  . Brinzolamide-Brimonidine Shortness Of Breath    Feels he has difficulty breathing when on medication    Current Outpatient Medications  Medication Sig Dispense Refill  . aspirin EC 325 MG tablet Take 1 tablet (325 mg total) by mouth daily. 30 tablet 0  . bimatoprost (LUMIGAN) 0.01 % SOLN Place 1 drop into both eyes 2 (two) times daily.     . brimonidine (ALPHAGAN P) 0.1 % SOLN Place 1 drop into both eyes daily.     . cetirizine (ZYRTEC) 10 MG tablet Take 1 tablet (10 mg total) by mouth daily. One tab daily for allergies 30 tablet 1  . cyanocobalamin ([employer] VITAMIN B12) 2000 MCG  tablet Take 2,000 mcg by mouth daily.     . furosemide (LASIX) 40 MG tablet Take 1 tablet (40 mg total) by mouth daily. 90 tablet 1  . hydrALAZINE (APRESOLINE) 50 MG tablet Take 50 mg by mouth 2 (two) times daily.    Marland Kitchen HYDROcodone-acetaminophen (NORCO/VICODIN) 5-325 MG per tablet Take 1 tablet by mouth every 6 (six) hours as needed. FOR PAIN  0  . lisinopril (PRINIVIL,ZESTRIL) 20 MG tablet Take 20 mg by mouth daily.   0  . lovastatin (MEVACOR) 20 MG tablet Take 20 mg by mouth at bedtime.    . metoprolol succinate (TOPROL XL) 25 MG 24 hr tablet Take 1 tablet (25 mg total) by mouth daily. 30 tablet 11  . Omega-3 Fatty Acids (FISH OIL PO) Take 1 tablet by mouth daily.    . vitamin E 400 UNIT capsule Take 400 Units by mouth daily.      No current facility-administered medications for this visit.     REVIEW OF SYSTEMS:  [X]  denotes positive finding, [ ]  denotes negative finding Cardiac  Comments:  Chest pain or chest pressure:    Shortness of breath upon exertion:    Short of breath when lying flat:    Irregular heart rhythm:        Vascular    Pain  in calf, thigh, or hip brought on by ambulation:    Pain in feet at night that wakes you up from your sleep:     Blood clot in your veins:    Leg swelling:           PHYSICAL EXAM: Vitals:   01/26/18 1405  BP: 131/79  Pulse: 80  Resp: 16  Temp: (!) 97 F (36.1 C)  TempSrc: Oral  SpO2: 98%  Weight: 239 lb (108.4 kg)  Height: 6' (1.829 m)    GENERAL: The patient is a well-nourished male, in no acute distress. The vital signs are documented above. CARDIOVASCULAR: Palpable radial pulses bilaterally.  Palpable dorsalis pedis pulses bilaterally.  Abdomen obese and no there is some palpated PULMONARY: There is good air exchange  MUSCULOSKELETAL: There are no major deformities or cyanosis. NEUROLOGIC: No focal weakness or paresthesias are detected. SKIN: There are no ulcers or rashes noted. PSYCHIATRIC: The patient has a normal  affect.  DATA:  CT scan reveals maximal diameter of 4.9 cm.  The aneurysm begins below the renal arteries but he does have near circumferential thrombus.  Also has ectasia of his common iliac arteries bilaterally with dilatation to the 2.0 cm range.  MEDICAL ISSUES: The patient has had enlargement of his aneurysm.  CT from 2017 revealed 4.5 and currently is 4.9 cm.  I have recommended that we see him again in 6 months with duplex ultrasound follow-up.  I discussed options for open aneurysm repair and stent graft repair.  He may not be a candidate for stent grafting due to the near circumferential mural thrombus below the level of the renal arteries.  Explained symptoms of leaking aneurysm and he knows to call 911 and [facility] should this occur.    Larina Earthlyodd F. Atlantis Delong, MD FACS [facility] Office Tel [phone] Pager [phone]

## 2018-01-29 ENCOUNTER — Other Ambulatory Visit: Payer: Self-pay

## 2018-01-29 DIAGNOSIS — I714 Abdominal aortic aneurysm, without rupture, unspecified: Secondary | ICD-10-CM

## 2018-08-03 ENCOUNTER — Ambulatory Visit (HOSPITAL_COMMUNITY)
Admission: RE | Admit: 2018-08-03 | Discharge: 2018-08-03 | Disposition: A | Discharge status: Home / Self Care | Payer: Medicare Other | Source: Ambulatory Visit | Attending: Vascular Surgery | Admitting: Vascular Surgery

## 2018-08-03 ENCOUNTER — Other Ambulatory Visit: Payer: Self-pay

## 2018-08-03 ENCOUNTER — Encounter: Payer: Self-pay | Admitting: Vascular Surgery

## 2018-08-03 ENCOUNTER — Ambulatory Visit (INDEPENDENT_AMBULATORY_CARE_PROVIDER_SITE_OTHER): Payer: Medicare Other | Admitting: Vascular Surgery

## 2018-08-03 VITALS — BP 116/85 | HR 62 | Temp 97.8°F | Resp 16 | Ht 72.0 in | Wt 235.0 lb

## 2018-08-03 DIAGNOSIS — I714 Abdominal aortic aneurysm, without rupture, unspecified: Secondary | ICD-10-CM

## 2018-08-03 NOTE — Progress Notes (Signed)
Vascular and Vein Specialist of [facility]  Patient name: Tyler Burns MRN: [id number] DOB: Mar 16, 1956 Sex: male  REASON FOR VISIT: Follow-up infrarenal abdominal aortic aneurysm  HPI: Tyler Burns is a 62 y.o. male here today for follow-up.  He has no new medical problems.  No symptoms referable to his aneurysm.  No evidence of peripheral vascular occlusive disease  Past Medical History:  Diagnosis Date  . Asthma   . Dysrhythmia   . Glaucoma   . History of cardiovascular stress test    Myoview 7/16:  EF 54%, small area of moderate inferoseptal ischemia, apical thinning, Low Risk  . Hypertension     Family History  Problem Relation Age of Onset  . Congestive Heart Failure Mother   . Heart attack Mother 39  . Heart attack Brother 45  . Leukemia Brother   . Heart failure Sister 77  . Stroke Neg Hx     SOCIAL HISTORY: Social History   Tobacco Use  . Smoking status: Former Smoker    Last attempt to quit: 10/27/1992    Years since quitting: 25.7  . Smokeless tobacco: Never Used  Substance Use Topics  . Alcohol use: No    Allergies  Allergen Reactions  . Brinzolamide-Brimonidine Shortness Of Breath    Feels he has difficulty breathing when on medication    Current Outpatient Medications  Medication Sig Dispense Refill  . aspirin EC 325 MG tablet Take 1 tablet (325 mg total) by mouth daily. 30 tablet 0  . bimatoprost (LUMIGAN) 0.01 % SOLN Place 1 drop into both eyes 2 (two) times daily.     . brimonidine (ALPHAGAN P) 0.1 % SOLN Place 1 drop into both eyes daily.     . cetirizine (ZYRTEC) 10 MG tablet Take 1 tablet (10 mg total) by mouth daily. One tab daily for allergies 30 tablet 1  . cyanocobalamin ([employer] VITAMIN B12) 2000 MCG tablet Take 2,000 mcg by mouth daily.     . furosemide (LASIX) 40 MG tablet Take 1 tablet (40 mg total) by mouth daily. 90 tablet 1  . hydrALAZINE (APRESOLINE) 50 MG tablet Take 50 mg by mouth 2 (two) times  daily.    Marland Kitchen HYDROcodone-acetaminophen (NORCO/VICODIN) 5-325 MG per tablet Take 1 tablet by mouth every 6 (six) hours as needed. FOR PAIN  0  . lisinopril (PRINIVIL,ZESTRIL) 20 MG tablet Take 20 mg by mouth daily.   0  . lovastatin (MEVACOR) 20 MG tablet Take 20 mg by mouth at bedtime.    . metoprolol succinate (TOPROL XL) 25 MG 24 hr tablet Take 1 tablet (25 mg total) by mouth daily. 30 tablet 11  . Omega-3 Fatty Acids (FISH OIL PO) Take 1 tablet by mouth daily.    . vitamin E 400 UNIT capsule Take 400 Units by mouth daily.      No current facility-administered medications for this visit.     REVIEW OF SYSTEMS:  [X]  denotes positive finding, [ ]  denotes negative finding Cardiac  Comments:  Chest pain or chest pressure:    Shortness of breath upon exertion:    Short of breath when lying flat:    Irregular heart rhythm:        Vascular    Pain in calf, thigh, or hip brought on by ambulation:    Pain in feet at night that wakes you up from your sleep:     Blood clot in your veins:    Leg swelling:  PHYSICAL EXAM: Vitals:   08/03/18 1023  BP: 116/85  Pulse: 62  Resp: 16  Temp: 97.8 F (36.6 C)  TempSrc: Oral  SpO2: 96%  Weight: 235 lb (106.6 kg)  Height: 6' (1.829 m)    GENERAL: The patient is a well-nourished male, in no acute distress. The vital signs are documented above. CARDIOVASCULAR: 2+ radial and 2+ dorsalis pedis pulses bilaterally.  Abdomen obese.  I do not palpate aneurysm PULMONARY: There is good air exchange  MUSCULOSKELETAL: There are no major deformities or cyanosis. NEUROLOGIC: No focal weakness or paresthesias are detected. SKIN: There are no ulcers or rashes noted. PSYCHIATRIC: The patient has a normal affect.  DATA:  Ultrasound today shows maximal diameter of 5.1 cm in his infrarenal aorta.  This compares to 4.9 cm by CT scan 6 months ago  MEDICAL ISSUES: Stable size of his infrarenal abdominal aortic aneurysm.  CT prior suggested near  circumferential mural thrombus at the level of the renals making him a questionable candidate for stent grafting.  I again reviewed symptoms of leaking aneurysm with the patient he knows to report immediately to the emergency room.  Otherwise we will see him in 6 months with CT scan follow-up    Larina Earthly, MD [facility] of [facility] Tel [phone] Pager [phone]

## 2018-09-30 ENCOUNTER — Encounter: Payer: Self-pay | Admitting: Interventional Cardiology

## 2018-09-30 ENCOUNTER — Ambulatory Visit: Payer: Medicare Other | Admitting: Interventional Cardiology

## 2018-09-30 VITALS — BP 102/86 | HR 50 | Ht 72.0 in | Wt 244.2 lb

## 2018-09-30 DIAGNOSIS — I4821 Permanent atrial fibrillation: Secondary | ICD-10-CM

## 2018-09-30 DIAGNOSIS — I714 Abdominal aortic aneurysm, without rupture, unspecified: Secondary | ICD-10-CM

## 2018-09-30 DIAGNOSIS — N183 Chronic kidney disease, stage 3 unspecified: Secondary | ICD-10-CM

## 2018-09-30 DIAGNOSIS — Z01810 Encounter for preprocedural cardiovascular examination: Secondary | ICD-10-CM | POA: Diagnosis not present

## 2018-09-30 MED ORDER — ASPIRIN EC 81 MG PO TBEC: 81.0000 mg | DELAYED_RELEASE_TABLET | Freq: Every day | ORAL | 3 refills | Status: DC

## 2018-09-30 NOTE — Patient Instructions (Signed)
Medication Instructions:  Your physician has recommended you make the following change in your medication:   DECREASE: aspirin to 81 mg daily   If you need a refill on your cardiac medications before your next appointment, please call your pharmacy.   Lab work: None Ordered  If you have labs (blood work) drawn today and your tests are completely normal, you will receive your results only by: Marland Kitchen. MyChart Message (if you have MyChart) OR . A paper copy in the mail If you have any lab test that is abnormal or we need to change your treatment, we will call you to review the results.  Testing/Procedures: Your physician has requested that you have a lexiscan myoview. For further information please visit [email or web address]. Please follow instruction sheet, as given.  Follow-Up: At [facility], you and your health needs are our priority.  As part of our continuing mission to provide you with exceptional heart care, we have created designated Provider Care Teams.  These Care Teams include your primary Cardiologist (physician) and Advanced Practice Providers (APPs -  Physician Assistants and Nurse Practitioners) who all work together to provide you with the care you need, when you need it. . You will need a follow up appointment in 1 year.  Please call our office 2 months in advance to schedule this appointment.  You may see Everette RankJay Varanasi, MD or one of the following Advanced Practice Providers on your designated Care Team:   . Robbie LisBrittainy Simmons, PA-C . Dayna Dunn, PA-C . Jacolyn ReedyMichele Lenze, PA-C  Any Other Special Instructions Will Be Listed Below (If Applicable).

## 2018-09-30 NOTE — Progress Notes (Signed)
Cardiology Office Note   Date:  09/30/2018   ID:  Tyler, Burns 1956/06/09, MRN [id number]  PCP:  Quitman Livings, MD    No chief complaint on file.  Abnormal stress test  Wt Readings from Last 3 Encounters:  09/30/18 244 lb 3.2 oz (110.8 kg)  08/03/18 235 lb (106.6 kg)  01/26/18 239 lb (108.4 kg)       History of Present Illness: Tyler Burns is a 62 y.o. male  with a hx of chronic atrial fibrillation, CKD, mildly depressed LV function. CHADS2-VASc=1 (sCHF). He has been treated with ASA only.   He had a R inguinal hernia. He had surgical open repair with Dr. Avel Peace in 8/16. No cardiac issues with surgery. He had a low risk stress test, that showed a small area of ischemia.  This was medically managed.  No chest pain.  His activity is significantly limited by his chronic back pain.  Denies : Chest pain. Dizziness. Leg edema. Nitroglycerin use. Orthopnea. Palpitations. Paroxysmal nocturnal dyspnea. Shortness of breath. Syncope.   Back limits his exercise.  He walks a lot during the summer.  He goes about 25 minutes with a break in the middle.  He may do this several times.  He walks at a park.  Less walking in the cold weather.    He has a remote hx of smoking. He is treated for HL. He has a FHx of CAD (brother, mother with prior MI; sister died with CHF).  Past Medical History:  Diagnosis Date  . Asthma   . Dysrhythmia   . Glaucoma   . History of cardiovascular stress test    Myoview 7/16:  EF 54%, small area of moderate inferoseptal ischemia, apical thinning, Low Risk  . Hypertension     Past Surgical History:  Procedure Laterality Date  . APPENDECTOMY    . INGUINAL HERNIA REPAIR Right 06/15/2015   Procedure: RIGHT INGUINAL HERNIA REPAIR WITH MESH;  Surgeon: Avel Peace, MD;  Location: [facility] OR;  Service: General;  Laterality: Right;  . INSERTION OF MESH Right 06/15/2015   Procedure: INSERTION OF MESH;  Surgeon: Avel Peace, MD;  Location:  [facility] OR;  Service: General;  Laterality: Right;     Current Outpatient Medications  Medication Sig Dispense Refill  . aspirin EC 325 MG tablet Take 1 tablet (325 mg total) by mouth daily. 30 tablet 0  . bimatoprost (LUMIGAN) 0.01 % SOLN Place 1 drop into both eyes 2 (two) times daily.     . cetirizine (ZYRTEC) 10 MG tablet Take 1 tablet (10 mg total) by mouth daily. One tab daily for allergies 30 tablet 1  . cyanocobalamin ([employer]S VITAMIN B12) 2000 MCG tablet Take 2,000 mcg by mouth daily.     . furosemide (LASIX) 40 MG tablet Take 1 tablet (40 mg total) by mouth daily. 90 tablet 1  . hydrALAZINE (APRESOLINE) 50 MG tablet Take 50 mg by mouth 2 (two) times daily.    Marland Kitchen lisinopril (PRINIVIL,ZESTRIL) 20 MG tablet Take 20 mg by mouth daily.   0  . metoprolol succinate (TOPROL XL) 25 MG 24 hr tablet Take 1 tablet (25 mg total) by mouth daily. 30 tablet 11  . Omega-3 Fatty Acids (FISH OIL PO) Take 1 tablet by mouth daily.    . vitamin E 400 UNIT capsule Take 400 Units by mouth daily.     Marland Kitchen lovastatin (MEVACOR) 40 MG tablet   10   No current facility-administered medications for  this visit.     Allergies:   Brinzolamide-brimonidine    Social History:  The patient  reports that he quit smoking about 25 years ago. He has never used smokeless tobacco. He reports that he does not drink alcohol or use drugs.   Family History:  The patient's family history includes Congestive Heart Failure in his mother; Heart attack (age of onset: 7545) in his brother; Heart attack (age of onset: 5255) in his mother; Heart failure (age of onset: 7270) in his sister; Leukemia in his brother.    ROS:  Please see the history of present illness.   Otherwise, review of systems are positive for back pain.   All other systems are reviewed and negative.    PHYSICAL EXAM: VS:  BP 102/86   Pulse (!) 50   Ht 6' (1.829 m)   Wt 244 lb 3.2 oz (110.8 kg)   SpO2 91%   BMI 33.12 kg/m  , BMI Body mass index is 33.12 kg/m. GEN: Well  nourished, well developed, in no acute distress  HEENT: normal  Neck: no JVD, carotid bruits, or masses Cardiac: irregularly irregular; no murmurs, rubs, or gallops,no edema  Respiratory:  clear to auscultation bilaterally, normal work of breathing GI: soft, nontender, nondistended, + BS MS: no deformity or atrophy  Skin: warm and dry, no rash Neuro:  Strength and sensation are intact Psych: euthymic mood, full affect   EKG:   The ekg ordered today demonstrates AFib, slow ventricular response   Recent Labs: No results found for requested labs within last 8760 hours.   Lipid Panel No results found for: CHOL, TRIG, HDL, CHOLHDL, VLDL, LDLCALC, LDLDIRECT   Other studies Reviewed: Additional studies/ records that were reviewed today with results demonstrating: 2016 stress test reviewed.   ASSESSMENT AND PLAN:  1. AFib: rate controlled.  Change to ASA 81 mg daily.   2. AAA: Preoperative eval.  Plan for chemical stress test.  Mildly abnormal in the past.  Medically managed.  No sx at this time.  I suspect he will be fine for surery, but will recheck the test and get a sense for EF as well. Back pain will limit his ability to walk on the treadmill. 3. HTN: The current medical regimen is effective;  continue present plan and medications.  Minimal medication at this point for BP.  Once he turns 63, would have to reconsider whether he needs anticoagulation for AFib.  I suspect he may not be interested.  May depend on whether his AAA has been repaired, when we discuss bleeding risks.  This patients CHA2DS2-VASc Score and unadjusted Ischemic Stroke Rate (% per year) is equal to 0.6 % stroke rate/year from a score of 1  Above score calculated as 1 point each if present [CHF, HTN, DM, Vascular=MI/PAD/Aortic Plaque, Age if 65-74, or Male] Above score calculated as 2 points each if present [Age > 75, or Stroke/TIA/TE]     Current medicines are reviewed at length with the patient today.   The patient concerns regarding his medicines were addressed.  The following changes have been made:  No change  Labs/ tests ordered today include:  Lexiscan nuclear No orders of the defined types were placed in this encounter.   Recommend 150 minutes/week of aerobic exercise Low fat, low carb, high fiber diet recommended  Disposition:   FU in late 2020   Signed, Lance MussJayadeep Tinesha Siegrist, MD  09/30/2018 10:15 AM    [facility] [address],  [place], [place]  [address] Phone: [phone]; Fax: [phone]

## 2018-10-07 ENCOUNTER — Telehealth (HOSPITAL_COMMUNITY): Payer: Self-pay | Admitting: *Deleted

## 2018-10-07 NOTE — Telephone Encounter (Signed)
Left message on voicemail in reference to upcoming appointment scheduled for 10/13/18. Phone number given for a call back so details instructions can be given. Tyler Burns ° ° °

## 2018-10-07 NOTE — Telephone Encounter (Signed)
Patient given detailed instructions per Myocardial Perfusion Study Information Sheet for the test on 10/13/18 at 9:45. Patient notified to arrive 15 minutes early and that it is imperative to arrive on time for appointment to keep from having the test rescheduled.  If you need to cancel or reschedule your appointment, please call the office within 24 hours of your appointment. . Patient verbalized understanding.Tyler DolinSharon S Brooks

## 2018-10-13 ENCOUNTER — Telehealth: Payer: Self-pay

## 2018-10-13 ENCOUNTER — Encounter: Payer: Self-pay | Admitting: *Deleted

## 2018-10-13 ENCOUNTER — Ambulatory Visit (HOSPITAL_COMMUNITY): Payer: Medicare Other | Attending: Cardiovascular Disease

## 2018-10-13 DIAGNOSIS — Z01810 Encounter for preprocedural cardiovascular examination: Secondary | ICD-10-CM | POA: Diagnosis not present

## 2018-10-13 DIAGNOSIS — I714 Abdominal aortic aneurysm, without rupture, unspecified: Secondary | ICD-10-CM

## 2018-10-13 LAB — MYOCARDIAL PERFUSION IMAGING
CHL CUP NUCLEAR SRS: 0
LV dias vol: 99 mL (ref 62–150)
LVSYSVOL: 50 mL
NUC STRESS TID: 1.03
Peak HR: 83 {beats}/min
Rest HR: 67 {beats}/min
SDS: 0
SSS: 0

## 2018-10-13 MED ORDER — REGADENOSON 0.4 MG/5ML IV SOLN
0.4000 mg | Freq: Once | INTRAVENOUS | Status: AC
  Administered 2018-10-13: 0.4 mg via INTRAVENOUS

## 2018-10-13 MED ORDER — TECHNETIUM TC 99M TETROFOSMIN IV KIT
25.1000 | PACK | Freq: Once | INTRAVENOUS | Status: AC | PRN
  Administered 2018-10-13: 25.1 via INTRAVENOUS
  Filled 2018-10-13: qty 26

## 2018-10-13 MED ORDER — TECHNETIUM TC 99M TETROFOSMIN IV KIT
8.4000 | PACK | Freq: Once | INTRAVENOUS | Status: AC | PRN
  Administered 2018-10-13: 8.4 via INTRAVENOUS
  Filled 2018-10-13: qty 9

## 2018-10-13 NOTE — Telephone Encounter (Signed)
-----   Message from Corky CraftsJayadeep S Varanasi, MD sent at 10/13/2018  4:41 PM EST ----- Normal stress test

## 2018-10-13 NOTE — Telephone Encounter (Signed)
Left message for patient to call back  

## 2018-10-14 NOTE — Telephone Encounter (Signed)
Follow Up: ° ° ° °Returning your call from yesterday. °

## 2018-10-14 NOTE — Telephone Encounter (Signed)
The patient has been notified of the result and verbalized understanding.  All questions (if any) were answered.     

## 2019-01-07 ENCOUNTER — Other Ambulatory Visit: Payer: Self-pay

## 2019-01-07 DIAGNOSIS — I714 Abdominal aortic aneurysm, without rupture, unspecified: Secondary | ICD-10-CM

## 2019-01-31 ENCOUNTER — Other Ambulatory Visit: Payer: Medicare Other

## 2019-02-01 ENCOUNTER — Ambulatory Visit: Payer: Medicare Other | Admitting: Vascular Surgery

## 2019-04-12 ENCOUNTER — Ambulatory Visit
Admission: RE | Admit: 2019-04-12 | Discharge: 2019-04-12 | Disposition: A | Discharge status: Home / Self Care | Payer: Medicare Other | Source: Ambulatory Visit | Attending: Vascular Surgery | Admitting: Vascular Surgery

## 2019-04-12 DIAGNOSIS — I714 Abdominal aortic aneurysm, without rupture, unspecified: Secondary | ICD-10-CM

## 2019-04-12 IMAGING — CT CT ANGIOGRAPHY ABDOMEN AND PELVIS WITH CONTRAST AND WITHOUT CONT
2 of 7 series · 15 of 46 positions shown, 17 images · IV contrast (iopamidol)
Comparison: [DATE]

CLINICAL DATA: AAA no symptoms No cancer Appendectomy inguinal
hernia repair^

EXAM:
CTA ABDOMEN AND PELVIS WITH CONTRAST
TECHNIQUE: Multidetector CT imaging of the abdomen and pelvis was performed
using the standard protocol during bolus administration of
intravenous contrast. Multiplanar reconstructed images and MIPs were
obtained and reviewed to evaluate the vascular anatomy.
CONTRAST:  75mL [OT] IOPAMIDOL ([OT]) INJECTION 76%
Creatinine was obtained on site at [HOSPITAL] at [REDACTED].
Results: Creatinine 1.3 mg/dL. GFR 58

[Series 4: cta arterial 2.00 bv36 s3 axial st · axial · arterial · 0.94mm/px · z∈[+1179,+1619]mm · 12 of 243 slices shown, 14 images]
[im 12/243  soft-tissue]
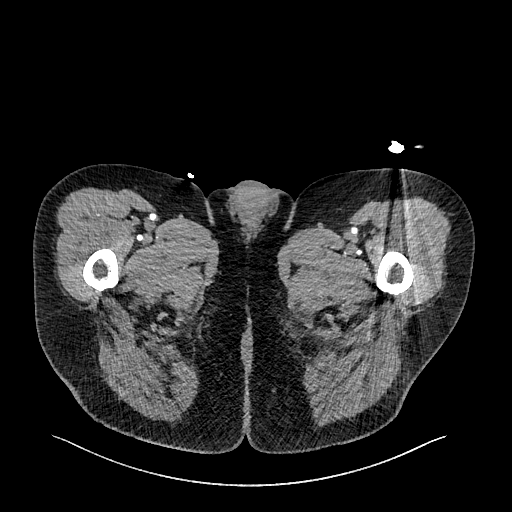
[im 12/243  bone]
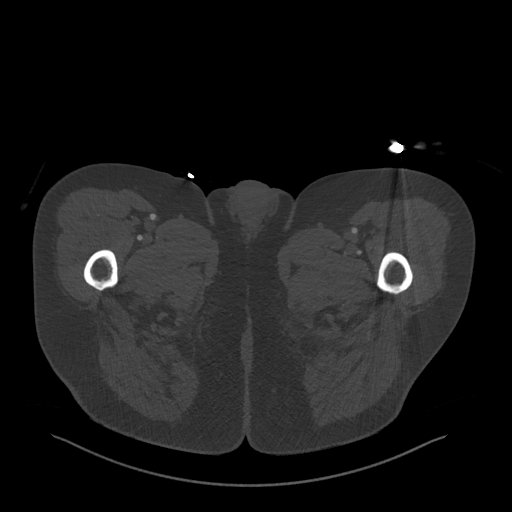
[im 34/243  soft-tissue]
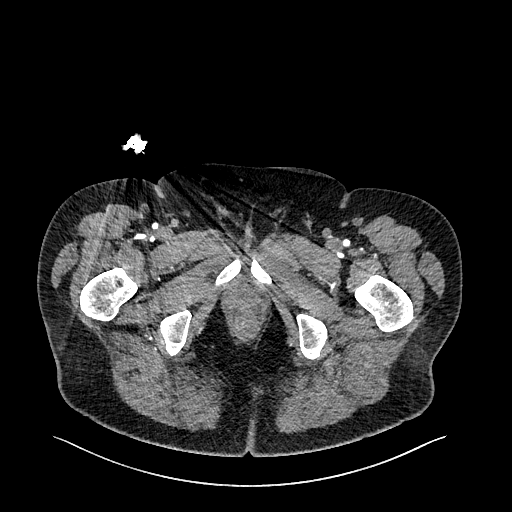
[im 56/243  soft-tissue]
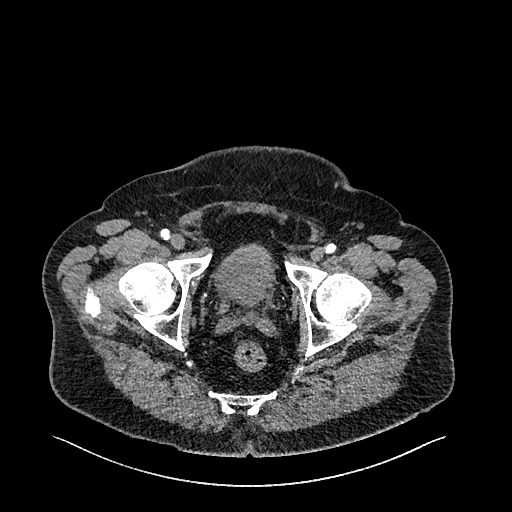
[im 78/243  soft-tissue]
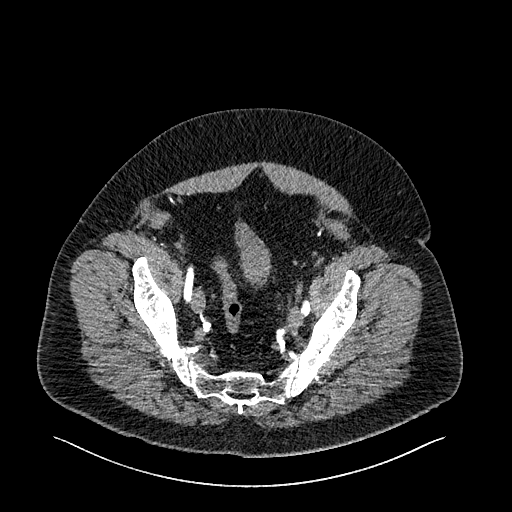
[im 89/243  soft-tissue]
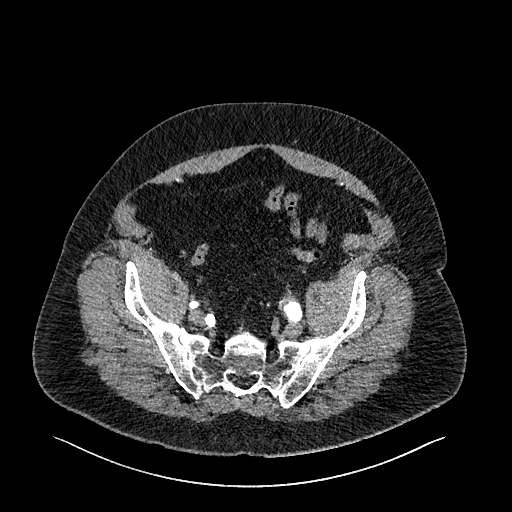
[im 111/243  soft-tissue]
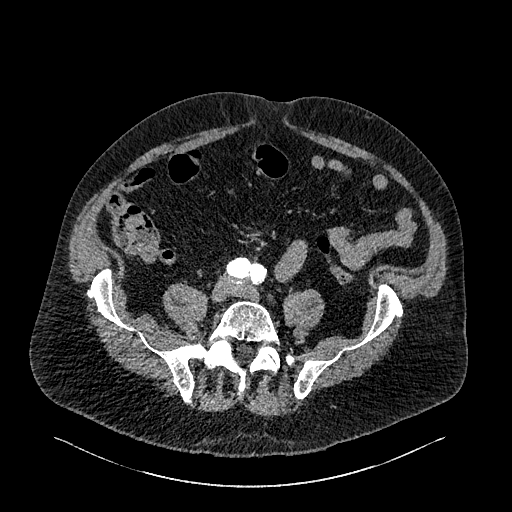
[im 133/243  soft-tissue]
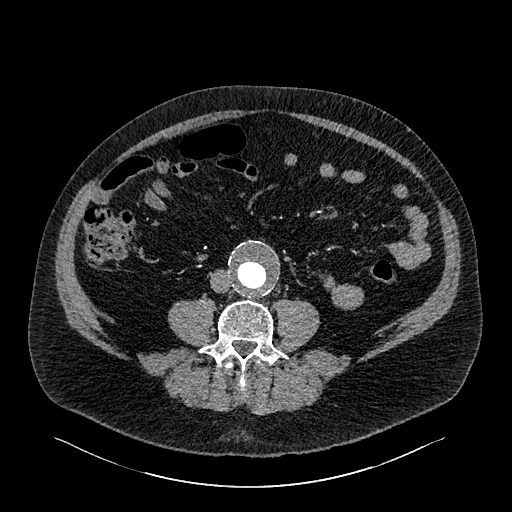
[im 155/243  soft-tissue]
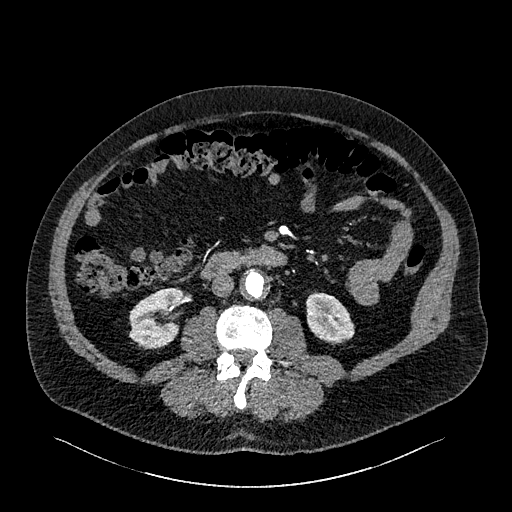
[im 166/243  soft-tissue]
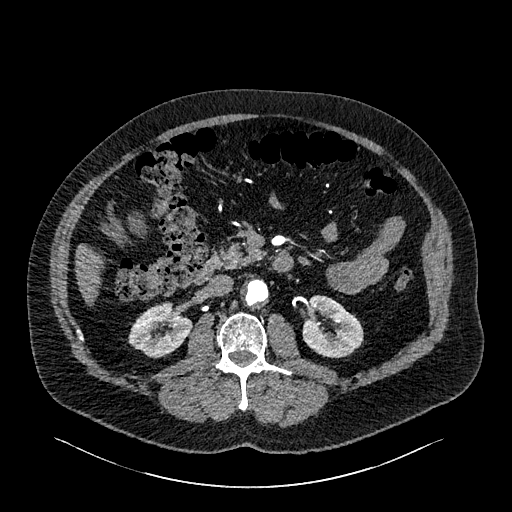
[im 166/243  bone]
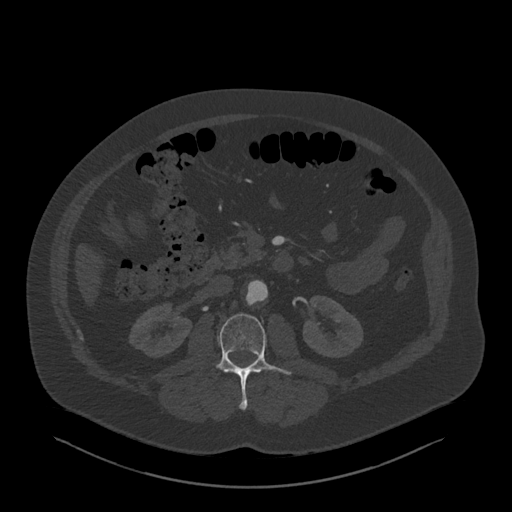
[im 188/243  soft-tissue]
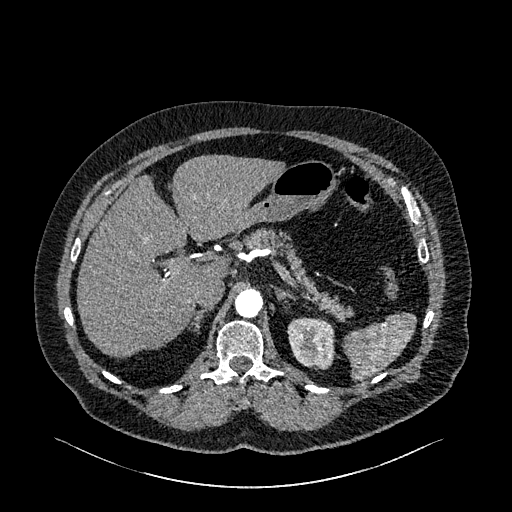
[im 210/243  soft-tissue]
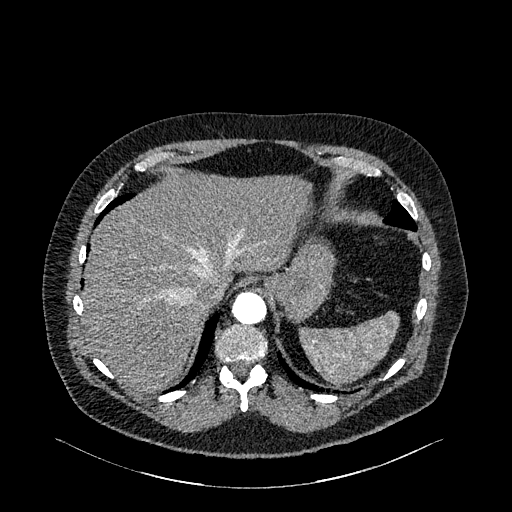
[im 232/243  soft-tissue]
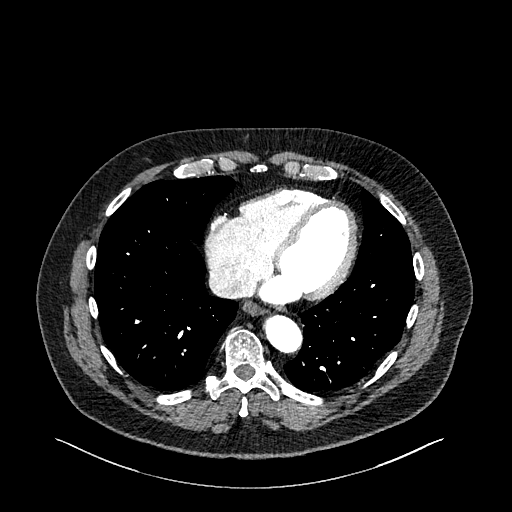

[Series 8: cta arterial 2.00 bv36 s3 cor cor art st · coronal · arterial · 0.85mm/px · 3 of 176 slices shown]
[im 44/176  soft-tissue]
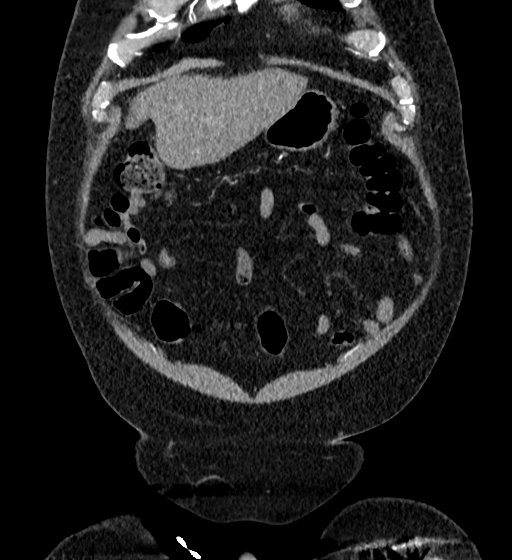
[im 88/176  soft-tissue]
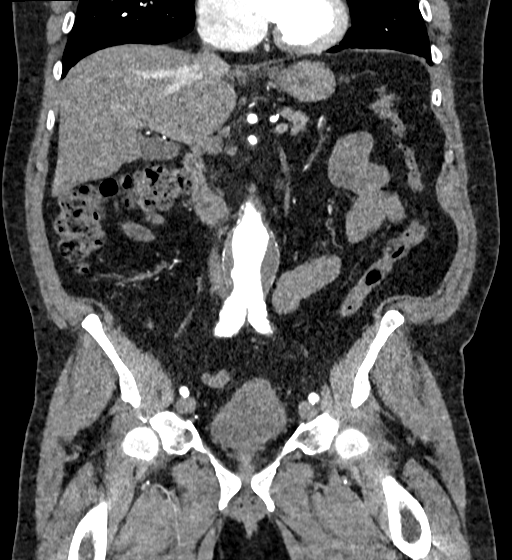
[im 132/176  soft-tissue]
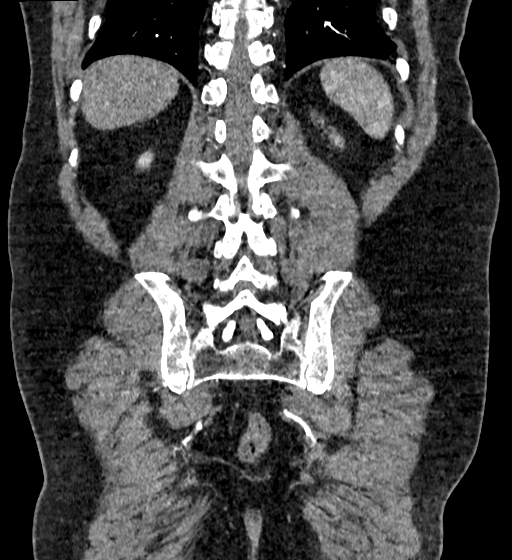

[15 of 46 positions shown; findings below may reference images not displayed]

FINDINGS: VASCULAR

Aorta: Visualized distal descending thoracic segment unremarkable.
Eccentric nonocclusive thrombus in the juxtarenal segment. Fusiform
infrarenal aneurysm 5.5 x 4.9 cm maximum transverse dimensions
(previously 4.9 cm). There is eccentric nonocclusive mural thrombus
in the aneurysmal segment. Aorta tapers to a diameter 3.2 cm at the
bifurcation. No dissection or stenosis.

Celiac: Patent without evidence of aneurysm, dissection, vasculitis
or significant stenosis.

SMA: Patent without evidence of aneurysm, dissection, vasculitis or
significant stenosis.

Renals: Single left, mildly atheromatous without high-grade
stenosis.

Single right, with partially calcified ostial plaque resulting in
short segment stenosis of at least mild severity, patent distally.

IMA: Apparent short segment origin occlusion, reconstituted distally
by visceral collaterals.

Inflow: Fusiform dilatation of proximal right common iliac up to
cm diameter. Dilatation of the distal left common iliac 2.3 cm
diameter. Bilateral external iliac arteries patent.

Proximal Outflow: Bilateral common femoral and visualized portions
of the superficial and profunda femoral arteries are patent without
evidence of aneurysm, dissection, vasculitis or significant
stenosis.

Veins: Patent hepatic veins, portal vein, SMV, splenic vein,
bilateral renal veins. No venous pathology identified

Review of the MIP images confirms the above findings.

NON-VASCULAR

Lower chest: No acute abnormality.

Hepatobiliary: No focal liver abnormality is seen. No gallstones,
gallbladder wall thickening, or biliary dilatation.

Pancreas: Unremarkable. No pancreatic ductal dilatation or
surrounding inflammatory changes.

Spleen: Normal in size without focal abnormality.

Adrenals/Urinary Tract: Adrenal glands are unremarkable. Kidneys are
normal, without renal calculi, focal lesion, or hydronephrosis.
Bladder is unremarkable.

Stomach/Bowel: Stomach is nondistended. Small bowel decompressed.
Appendix surgically absent. The colon is nondilated, unremarkable.

Lymphatic: No abdominal or pelvic adenopathy.

Reproductive: Prostate enlargement with central coarse calcification

Other: No ascites. No free air. Metallic foreign body in the
anterior peritoneal cavity just inferolateral to the umbilicus on
the right, stable since [DATE].

Musculoskeletal: Umbilical hernia containing only mesenteric fat.
Mild lumbar spondylitic changes. No fracture or worrisome bone
lesion.
IMPRESSION: VASCULAR

1. Interval enlargement of 5.5 cm infrarenal abdominal aortic
aneurysm, previously 4.9 cm. Greater than 5 mm growth over 12 months
is associated with an increased risk of aneurysm rupture. Recommend
vascular surgery referral/consultation if not already obtained.
2. 2.3 cm right and 2.3 cm left common iliac artery aneurysms.

NON-VASCULAR

1. No acute findings

## 2019-04-12 MED ORDER — IOPAMIDOL (ISOVUE-370) INJECTION 76%
75.0000 mL | Freq: Once | INTRAVENOUS | Status: AC | PRN
  Administered 2019-04-12: 75 mL via INTRAVENOUS

## 2019-04-19 ENCOUNTER — Encounter: Payer: Self-pay | Admitting: *Deleted

## 2019-04-19 ENCOUNTER — Ambulatory Visit (INDEPENDENT_AMBULATORY_CARE_PROVIDER_SITE_OTHER): Payer: Medicare Other | Admitting: Vascular Surgery

## 2019-04-19 ENCOUNTER — Other Ambulatory Visit: Payer: Self-pay

## 2019-04-19 ENCOUNTER — Encounter: Payer: Self-pay | Admitting: Vascular Surgery

## 2019-04-19 ENCOUNTER — Other Ambulatory Visit: Payer: Self-pay | Admitting: *Deleted

## 2019-04-19 VITALS — BP 133/95 | HR 65 | Temp 97.4°F | Resp 20 | Ht 72.0 in | Wt 243.0 lb

## 2019-04-19 DIAGNOSIS — I714 Abdominal aortic aneurysm, without rupture, unspecified: Secondary | ICD-10-CM

## 2019-04-19 NOTE — Progress Notes (Signed)
Pre-op instructions given. Continue Aspirin, covid nasal swab at [facility] 10 am 05/21/2019, Mag. Citrate bowel prep day prior to surgery. [facility] pr-admission department will follow up with pr-op testing.

## 2019-04-19 NOTE — Progress Notes (Signed)
Vascular and Vein Specialist of [facility]  Patient name: Tyler Burns MRN: [id number] DOB: 30-Oct-1955 Sex: male  REASON FOR VISIT: Continued follow-up of abdominal aortic aneurysm  HPI: Tyler Burns is a 63 y.o. male here today for continued follow-up.  He has known history of non-aortic aneurysm and underwent recent CT scan for continued follow-up.  He has no new medical difficulties.  He has not had any cardiac difficulties.  He does have irregular heart rate.  He is not on any anticoagulation.  Past Medical History:  Diagnosis Date  . Asthma   . Dysrhythmia   . Glaucoma   . History of cardiovascular stress test    Myoview 7/16:  EF 54%, small area of moderate inferoseptal ischemia, apical thinning, Low Risk  . Hypertension     Family History  Problem Relation Age of Onset  . Congestive Heart Failure Mother   . Heart attack Mother 52  . Heart attack Brother 3  . Leukemia Brother   . Heart failure Sister 56  . Stroke Neg Hx     SOCIAL HISTORY: Social History   Tobacco Use  . Smoking status: Former Smoker    Quit date: 10/27/1992    Years since quitting: 26.4  . Smokeless tobacco: Never Used  Substance Use Topics  . Alcohol use: No    Allergies  Allergen Reactions  . Brinzolamide-Brimonidine Shortness Of Breath    Feels he has difficulty breathing when on medication    Current Outpatient Medications  Medication Sig Dispense Refill  . aspirin EC 81 MG tablet Take 1 tablet (81 mg total) by mouth daily. 90 tablet 3  . bimatoprost (LUMIGAN) 0.01 % SOLN Place 1 drop into both eyes 2 (two) times daily.     . cetirizine (ZYRTEC) 10 MG tablet Take 1 tablet (10 mg total) by mouth daily. One tab daily for allergies 30 tablet 1  . cyanocobalamin ([employer]S VITAMIN B12) 2000 MCG tablet Take 2,000 mcg by mouth daily.     . furosemide (LASIX) 40 MG tablet Take 1 tablet (40 mg total) by mouth daily. 90 tablet 1  . hydrALAZINE (APRESOLINE) 50 MG  tablet Take 50 mg by mouth 2 (two) times daily.    Marland Kitchen lisinopril (PRINIVIL,ZESTRIL) 20 MG tablet Take 20 mg by mouth daily.   0  . lovastatin (MEVACOR) 40 MG tablet   10  . metoprolol succinate (TOPROL XL) 25 MG 24 hr tablet Take 1 tablet (25 mg total) by mouth daily. 30 tablet 11  . Omega-3 Fatty Acids (FISH OIL PO) Take 1 tablet by mouth daily.    . vitamin E 400 UNIT capsule Take 400 Units by mouth daily.      No current facility-administered medications for this visit.     REVIEW OF SYSTEMS:  [X]  denotes positive finding, [ ]  denotes negative finding Cardiac  Comments:  Chest pain or chest pressure:    Shortness of breath upon exertion:    Short of breath when lying flat:    Irregular heart rhythm:        Vascular    Pain in calf, thigh, or hip brought on by ambulation:    Pain in feet at night that wakes you up from your sleep:     Blood clot in your veins:    Leg swelling:           PHYSICAL EXAM: Vitals:   04/19/19 0858  BP: (!) 133/95  Pulse: 65  Resp: 20  Temp: (!) 97.4 F (36.3 C)  SpO2: 95%  Weight: 110.2 kg  Height: 6' (1.829 m)    GENERAL: The patient is a well-nourished male, in no acute distress. The vital signs are documented above. CARDIOVASCULAR: Carotid arteries without bruits bilaterally.  2+ radial and 2+ popliteal pulses.  Abdomen moderately obese with no aneurysm palpable PULMONARY: There is good air exchange  MUSCULOSKELETAL: There are no major deformities or cyanosis. NEUROLOGIC: No focal weakness or paresthesias are detected. SKIN: There are no ulcers or rashes noted. PSYCHIATRIC: The patient has a normal affect.  DATA:  CT scan shows maximal diameter of 5.5 cm with a significant growth.  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  MEDICAL ISSUES: I had a long discussion with the patient.  I have recommended elective repair both due to the size of his aneurysm and also a more rapid growth and would be expected.  I  discussed open aneurysm repair.  He does have mural thrombus extending up to the renal arteries making his proximal attachment somewhat precarious.  He does not want to entertain a stent graft.  He wants to proceed with open aneurysm repair and I feel that this is appropriate.  I discussed the magnitude of procedure including 1-2 nights in the intensive care and hospitalization of 5 to 7 days.  Also explained return of bowel function and requirements for discharge to home.  He wishes to proceed as soon as possible.  We will check with cardiology.  He has had low risk studies in the past.    Larina Earthlyodd F. Kahlia Lagunes, MD [facility] Office Tel [phone] Pager [phone]

## 2019-05-17 NOTE — Pre-Procedure Instructions (Signed)
Greer EeDewey L Mackins  05/17/2019      [facility] [place] [address] [place] [address] Phone: [phone] Fax: [phone]  [facility] - [place], [place] - [id number] [place] [address] Phone: [phone] Fax: [phone]    Your procedure is scheduled on May 25, 2019.  [facility] at 530 AM.  Call this number if you have problems the morning of surgery:  [phone]   Call [phone] if you have any questions prior to your surgery date Monday-Friday 8am-4pm    Remember:  Do not eat or drink after midnight.   Take these medicines the morning of surgery with A SIP OF WATER  Eye drops Hydralazine (apresoline) Metoprolol succinate (Toprol XL) Hydrocodone-Acetaminophen (norco)-if needed  Follow your surgeon's instructions on when to hold/resume aspirin.  If no instructions were given call the office to determine how they would like to you take aspirin  7 days prior to surgery STOP taking any Aleve, Naproxen, Ibuprofen, Motrin, Advil, Goody's, BC's, all herbal medications, fish oil, and all vitamins   Day of surgery:  Do not wear jewelry  Do not wear lotions, powders, or colognes, or deodorant.  Men may shave face and neck.  Do not bring valuables to the hospital.   [facility] is not responsible for any belongings or valuables.  IF you are a smoker, DO NOT Smoke 24 hours prior to surgery   IF you wear a CPAP at night please bring your mask, tubing, and machine the morning of surgery    Remember that you must have someone to transport you home after your surgery, and remain with you for 24 hours if you are discharged the same day.  Contacts, dentures or bridgework may not be worn into surgery.  Leave your suitcase in the car.  After surgery it may be brought to your room.  For patients admitted to the hospital, discharge time will be  determined by your treatment team.  Patients discharged the day of surgery will not be allowed to drive home.    [place]- Preparing For Surgery  Before surgery, you can play an important role. Because skin is not sterile, your skin needs to be as free of germs as possible. You can reduce the number of germs on your skin by washing with CHG (chlorahexidine gluconate) Soap before surgery.  CHG is an antiseptic cleaner which kills germs and bonds with the skin to continue killing germs even after washing.    Oral Hygiene is also important to reduce your risk of infection.  Remember - BRUSH YOUR TEETH THE MORNING OF SURGERY WITH YOUR REGULAR TOOTHPASTE  Please do not use if you have an allergy to CHG or antibacterial soaps. If your skin becomes reddened/irritated stop using the CHG.  Do not shave (including legs and underarms) for at least 48 hours prior to first CHG shower. It is OK to shave your face.  Please follow these instructions carefully.   1. Shower the NIGHT BEFORE SURGERY and the MORNING OF SURGERY with CHG.   2. If you chose to wash your hair, wash your hair first as usual with your normal shampoo.  3. After you shampoo, rinse your hair and body thoroughly to remove the shampoo.  4. Use CHG as you would any other liquid soap. You can apply CHG directly to the skin and wash  gently with a scrungie or a clean washcloth.   5. Apply the CHG Soap to your body ONLY FROM THE NECK DOWN.  Do not use on open wounds or open sores. Avoid contact with your eyes, ears, mouth and genitals (private parts). Wash Face and genitals (private parts)  with your normal soap.  6. Wash thoroughly, paying special attention to the area where your surgery will be performed.  7. Thoroughly rinse your body with warm water from the neck down.  8. DO NOT shower/wash with your normal soap after using and rinsing off the CHG Soap.  9. Pat yourself dry with a CLEAN TOWEL.  10. Wear CLEAN PAJAMAS to bed  the night before surgery, wear comfortable clothes the morning of surgery  11. Place CLEAN SHEETS on your bed the night of your first shower and DO NOT SLEEP WITH PETS.  Day of Surgery:  Do not apply any deodorants/lotions.  Please wear clean clothes to the hospital/surgery center.   Remember to brush your teeth WITH YOUR REGULAR TOOTHPASTE.  Please read over the following fact sheets that you were given.

## 2019-05-18 ENCOUNTER — Other Ambulatory Visit: Payer: Self-pay

## 2019-05-18 ENCOUNTER — Encounter (HOSPITAL_COMMUNITY)
Admission: RE | Admit: 2019-05-18 | Discharge: 2019-05-18 | Disposition: A | Discharge status: Home / Self Care | Payer: Medicare Other | Source: Ambulatory Visit | Attending: Vascular Surgery | Admitting: Vascular Surgery

## 2019-05-18 ENCOUNTER — Encounter (HOSPITAL_COMMUNITY): Payer: Self-pay

## 2019-05-18 DIAGNOSIS — Z01812 Encounter for preprocedural laboratory examination: Secondary | ICD-10-CM | POA: Insufficient documentation

## 2019-05-18 HISTORY — DX: Pure hypercholesterolemia, unspecified: E78.00

## 2019-05-18 LAB — CBC
HCT: 49.1 % (ref 39.0–52.0)
Hemoglobin: 16.3 g/dL (ref 13.0–17.0)
MCH: 31.8 pg (ref 26.0–34.0)
MCHC: 33.2 g/dL (ref 30.0–36.0)
MCV: 95.7 fL (ref 80.0–100.0)
Platelets: 222 10*3/uL (ref 150–400)
RBC: 5.13 MIL/uL (ref 4.22–5.81)
RDW: 13.1 % (ref 11.5–15.5)
WBC: 7.9 10*3/uL (ref 4.0–10.5)
nRBC: 0 % (ref 0.0–0.2)

## 2019-05-18 LAB — BLOOD GAS, ARTERIAL
Acid-base deficit: 1.5 mmol/L (ref 0.0–2.0)
Bicarbonate: 22.5 mmol/L (ref 20.0–28.0)
Drawn by: 42180
FIO2: 21
O2 Saturation: 97.2 %
Patient temperature: 98.6
pCO2 arterial: 36.3 mmHg (ref 32.0–48.0)
pH, Arterial: 7.409 (ref 7.350–7.450)
pO2, Arterial: 93.8 mmHg (ref 83.0–108.0)

## 2019-05-18 LAB — URINALYSIS, ROUTINE W REFLEX MICROSCOPIC
Bacteria, UA: NONE SEEN
Bilirubin Urine: NEGATIVE
Glucose, UA: NEGATIVE mg/dL
Hgb urine dipstick: NEGATIVE
Ketones, ur: NEGATIVE mg/dL
Nitrite: NEGATIVE
Protein, ur: NEGATIVE mg/dL
Specific Gravity, Urine: 1.018 (ref 1.005–1.030)
pH: 5 (ref 5.0–8.0)

## 2019-05-18 LAB — COMPREHENSIVE METABOLIC PANEL
ALT: 18 U/L (ref 0–44)
AST: 22 U/L (ref 15–41)
Albumin: 3.8 g/dL (ref 3.5–5.0)
Alkaline Phosphatase: 62 U/L (ref 38–126)
Anion gap: 9 (ref 5–15)
BUN: 20 mg/dL (ref 8–23)
CO2: 20 mmol/L — ABNORMAL LOW (ref 22–32)
Calcium: 9.5 mg/dL (ref 8.9–10.3)
Chloride: 106 mmol/L (ref 98–111)
Creatinine, Ser: 1.09 mg/dL (ref 0.61–1.24)
GFR calc Af Amer: >60 mL/min (ref >60)
GFR calc non Af Amer: >60 mL/min (ref >60)
Glucose, Bld: 175 mg/dL — ABNORMAL HIGH (ref 70–99)
Potassium: 4.1 mmol/L (ref 3.5–5.1)
Sodium: 135 mmol/L (ref 135–145)
Total Bilirubin: 0.5 mg/dL (ref 0.3–1.2)
Total Protein: 6.5 g/dL (ref 6.5–8.1)

## 2019-05-18 LAB — PROTIME-INR
INR: 1.1 (ref 0.8–1.2)
Prothrombin Time: 13.7 seconds (ref 11.4–15.2)

## 2019-05-18 LAB — SURGICAL PCR SCREEN
MRSA, PCR: NEGATIVE
Staphylococcus aureus: NEGATIVE

## 2019-05-18 LAB — APTT: aPTT: 31 seconds (ref 24–36)

## 2019-05-18 LAB — PREPARE RBC (CROSSMATCH)

## 2019-05-18 LAB — ABO/RH: ABO/RH(D): AB POS

## 2019-05-18 NOTE — Progress Notes (Signed)
PCP - Black  Chest x-ray - not needed EKG - 09/30/18 Stress Test - 2019 CE ECHO - 2014CE Cardiac Cath - denies   Aspirin Instructions: continue Anesthesia review: yes, cardiac hx  Patient denies shortness of breath, fever, cough and chest pain at PAT appointment   Patient verbalized understanding of instructions that were given to them at the PAT appointment. Patient was also instructed that they will need to review over the PAT instructions again at home before surgery.

## 2019-05-19 ENCOUNTER — Encounter (HOSPITAL_COMMUNITY): Payer: Self-pay

## 2019-05-19 NOTE — Anesthesia Preprocedure Evaluation (Addendum)
Anesthesia Evaluation  Patient identified by MRN, date of birth, ID band Patient awake    Reviewed: Allergy & Precautions, NPO status , Patient's Chart, lab work & pertinent test results, reviewed documented beta blocker date and time   History of Anesthesia Complications Negative for: history of anesthetic complications  Airway Mallampati: II  TM Distance: >3 FB Neck ROM: Full    Dental  (+) Poor Dentition, Missing, Edentulous Upper, Dental Advisory Given, Chipped   Pulmonary former smoker (quit 1970),    breath sounds clear to auscultation       Cardiovascular hypertension, Pt. on medications and Pt. on home beta blockers (-) angina+ Peripheral Vascular Disease (5.5 x4.9 infrarenal AAA)  + dysrhythmias Atrial Fibrillation  Rhythm:Irregular Rate:Normal  '19 myoview: EF 50%, no ischemia   Neuro/Psych Glaucoma Chronic back pain: narcotics daily    GI/Hepatic Neg liver ROS,   Endo/Other  Morbid obesity  Renal/GU Renal InsufficiencyRenal disease     Musculoskeletal   Abdominal (+) + obese,   Peds  Hematology negative hematology ROS (+)   Anesthesia Other Findings   Reproductive/Obstetrics                           Anesthesia Physical Anesthesia Plan  ASA: III  Anesthesia Plan: General   Post-op Pain Management:    Induction: Intravenous  PONV Risk Score and Plan: 3 and Dexamethasone, Ondansetron and Treatment may vary due to age or medical condition  Airway Management Planned: Oral ETT  Additional Equipment: Arterial line, PA Cath and Ultrasound Guidance Line Placement  Intra-op Plan:   Post-operative Plan: Possible Post-op intubation/ventilation  Informed Consent: I have reviewed the patients History and Physical, chart, labs and discussed the procedure including the risks, benefits and alternatives for the proposed anesthesia with the patient or authorized representative who has  indicated his/her understanding and acceptance.     Dental advisory given  Plan Discussed with: CRNA and Surgeon  Anesthesia Plan Comments: (Follows with cardiology for afib (not on anticoagulation) and mildly depressed LV function. Saw Dr. Irish Lack 09/2018 for preop eval, nuclear stress ordered. Stress done 10/13/18 showed no ischemia, EF 50%, low risk.  Nuclear stress 10/13/18:  Nuclear stress EF: 50%. No wall motion abnormality  There was no ST segment deviation noted during stress. No perfusion defects.  This is a low risk study. No ischemia identified.)      Anesthesia Quick Evaluation

## 2019-05-21 ENCOUNTER — Other Ambulatory Visit (HOSPITAL_COMMUNITY)
Admission: RE | Admit: 2019-05-21 | Discharge: 2019-05-21 | Disposition: A | Discharge status: Home / Self Care | Payer: Medicare Other | Source: Ambulatory Visit | Attending: Vascular Surgery | Admitting: Vascular Surgery

## 2019-05-21 DIAGNOSIS — Z01812 Encounter for preprocedural laboratory examination: Secondary | ICD-10-CM | POA: Diagnosis not present

## 2019-05-21 LAB — SARS CORONAVIRUS 2 (TAT 6-24 HRS): SARS Coronavirus 2: NEGATIVE

## 2019-05-25 ENCOUNTER — Inpatient Hospital Stay (HOSPITAL_COMMUNITY): Payer: Medicare Other | Admitting: Certified Registered"

## 2019-05-25 ENCOUNTER — Inpatient Hospital Stay (HOSPITAL_COMMUNITY): Payer: Medicare Other

## 2019-05-25 ENCOUNTER — Encounter (HOSPITAL_COMMUNITY): Payer: Self-pay | Admitting: *Deleted

## 2019-05-25 ENCOUNTER — Encounter (HOSPITAL_COMMUNITY)
Admission: RE | Disposition: A | Discharge status: Home / Self Care | Payer: Self-pay | Source: Home / Self Care | Attending: Vascular Surgery

## 2019-05-25 ENCOUNTER — Other Ambulatory Visit: Payer: Self-pay

## 2019-05-25 ENCOUNTER — Inpatient Hospital Stay (HOSPITAL_COMMUNITY)
Admission: RE | Admit: 2019-05-25 | Discharge: 2019-05-29 | DRG: 269 | Disposition: A | Discharge status: Home / Self Care | Payer: Medicare Other | Attending: Vascular Surgery | Admitting: Vascular Surgery

## 2019-05-25 ENCOUNTER — Inpatient Hospital Stay (HOSPITAL_COMMUNITY): Payer: Medicare Other | Admitting: Physician Assistant

## 2019-05-25 DIAGNOSIS — N183 Chronic kidney disease, stage 3 (moderate): Secondary | ICD-10-CM | POA: Diagnosis present

## 2019-05-25 DIAGNOSIS — I722 Aneurysm of renal artery: Secondary | ICD-10-CM

## 2019-05-25 DIAGNOSIS — Z8249 Family history of ischemic heart disease and other diseases of the circulatory system: Secondary | ICD-10-CM

## 2019-05-25 DIAGNOSIS — E669 Obesity, unspecified: Secondary | ICD-10-CM | POA: Diagnosis present

## 2019-05-25 DIAGNOSIS — H409 Unspecified glaucoma: Secondary | ICD-10-CM | POA: Diagnosis present

## 2019-05-25 DIAGNOSIS — Z6832 Body mass index (BMI) 32.0-32.9, adult: Secondary | ICD-10-CM

## 2019-05-25 DIAGNOSIS — I4821 Permanent atrial fibrillation: Secondary | ICD-10-CM | POA: Diagnosis present

## 2019-05-25 DIAGNOSIS — G8929 Other chronic pain: Secondary | ICD-10-CM | POA: Diagnosis present

## 2019-05-25 DIAGNOSIS — I714 Abdominal aortic aneurysm, without rupture, unspecified: Secondary | ICD-10-CM | POA: Diagnosis present

## 2019-05-25 DIAGNOSIS — Z79899 Other long term (current) drug therapy: Secondary | ICD-10-CM

## 2019-05-25 DIAGNOSIS — I482 Chronic atrial fibrillation, unspecified: Secondary | ICD-10-CM | POA: Diagnosis not present

## 2019-05-25 DIAGNOSIS — E785 Hyperlipidemia, unspecified: Secondary | ICD-10-CM | POA: Diagnosis not present

## 2019-05-25 DIAGNOSIS — I739 Peripheral vascular disease, unspecified: Secondary | ICD-10-CM | POA: Diagnosis present

## 2019-05-25 DIAGNOSIS — Z7982 Long term (current) use of aspirin: Secondary | ICD-10-CM

## 2019-05-25 DIAGNOSIS — I129 Hypertensive chronic kidney disease with stage 1 through stage 4 chronic kidney disease, or unspecified chronic kidney disease: Secondary | ICD-10-CM | POA: Diagnosis present

## 2019-05-25 DIAGNOSIS — I7409 Other arterial embolism and thrombosis of abdominal aorta: Secondary | ICD-10-CM | POA: Diagnosis present

## 2019-05-25 DIAGNOSIS — Z87891 Personal history of nicotine dependence: Secondary | ICD-10-CM

## 2019-05-25 DIAGNOSIS — Z8679 Personal history of other diseases of the circulatory system: Secondary | ICD-10-CM

## 2019-05-25 DIAGNOSIS — Z888 Allergy status to other drugs, medicaments and biological substances status: Secondary | ICD-10-CM | POA: Diagnosis not present

## 2019-05-25 HISTORY — PX: ABDOMINAL AORTIC ANEURYSM REPAIR: SHX42

## 2019-05-25 LAB — BLOOD GAS, ARTERIAL
Acid-base deficit: 2.8 mmol/L — ABNORMAL HIGH (ref 0.0–2.0)
Bicarbonate: 21.7 mmol/L (ref 20.0–28.0)
O2 Content: 3 L/min
O2 Saturation: 98.8 %
Patient temperature: 98.6
pCO2 arterial: 39.4 mmHg (ref 32.0–48.0)
pH, Arterial: 7.361 (ref 7.350–7.450)
pO2, Arterial: 150 mmHg — ABNORMAL HIGH (ref 83.0–108.0)

## 2019-05-25 LAB — POCT I-STAT 7, (LYTES, BLD GAS, ICA,H+H)
Acid-base deficit: 4 mmol/L — ABNORMAL HIGH (ref 0.0–2.0)
Acid-base deficit: 3 mmol/L — ABNORMAL HIGH (ref 0.0–2.0)
Acid-base deficit: 4 mmol/L — ABNORMAL HIGH (ref 0.0–2.0)
Bicarbonate: 21.9 mmol/L (ref 20.0–28.0)
Bicarbonate: 23.6 mmol/L (ref 20.0–28.0)
Bicarbonate: 22.8 mmol/L (ref 20.0–28.0)
Calcium, Ion: 1.06 mmol/L — ABNORMAL LOW (ref 1.15–1.40)
Calcium, Ion: 1.13 mmol/L — ABNORMAL LOW (ref 1.15–1.40)
Calcium, Ion: 1.17 mmol/L (ref 1.15–1.40)
HCT: 41 % (ref 39.0–52.0)
HCT: 43 % (ref 39.0–52.0)
HCT: 44 % (ref 39.0–52.0)
Hemoglobin: 13.9 g/dL (ref 13.0–17.0)
Hemoglobin: 14.6 g/dL (ref 13.0–17.0)
Hemoglobin: 15 g/dL (ref 13.0–17.0)
O2 Saturation: 100 %
O2 Saturation: 100 %
O2 Saturation: 100 %
Patient temperature: 36
Potassium: 4.4 mmol/L (ref 3.5–5.1)
Potassium: 5.3 mmol/L — ABNORMAL HIGH (ref 3.5–5.1)
Potassium: 5.7 mmol/L — ABNORMAL HIGH (ref 3.5–5.1)
Sodium: 134 mmol/L — ABNORMAL LOW (ref 135–145)
Sodium: 135 mmol/L (ref 135–145)
Sodium: 135 mmol/L (ref 135–145)
TCO2: 23 mmol/L (ref 22–32)
TCO2: 25 mmol/L (ref 22–32)
TCO2: 24 mmol/L (ref 22–32)
pCO2 arterial: 42.3 mmHg (ref 32.0–48.0)
pCO2 arterial: 49 mmHg — ABNORMAL HIGH (ref 32.0–48.0)
pCO2 arterial: 45.7 mmHg (ref 32.0–48.0)
pH, Arterial: 7.318 — ABNORMAL LOW (ref 7.350–7.450)
pH, Arterial: 7.291 — ABNORMAL LOW (ref 7.350–7.450)
pH, Arterial: 7.306 — ABNORMAL LOW (ref 7.350–7.450)
pO2, Arterial: 191 mmHg — ABNORMAL HIGH (ref 83.0–108.0)
pO2, Arterial: 322 mmHg — ABNORMAL HIGH (ref 83.0–108.0)
pO2, Arterial: 198 mmHg — ABNORMAL HIGH (ref 83.0–108.0)

## 2019-05-25 LAB — BASIC METABOLIC PANEL
Anion gap: 12 (ref 5–15)
BUN: 21 mg/dL (ref 8–23)
CO2: 20 mmol/L — ABNORMAL LOW (ref 22–32)
Calcium: 8.4 mg/dL — ABNORMAL LOW (ref 8.9–10.3)
Chloride: 105 mmol/L (ref 98–111)
Creatinine, Ser: 1.41 mg/dL — ABNORMAL HIGH (ref 0.61–1.24)
GFR calc Af Amer: >60 mL/min (ref >60)
GFR calc non Af Amer: 52 mL/min — ABNORMAL LOW (ref >60)
Glucose, Bld: 195 mg/dL — ABNORMAL HIGH (ref 70–99)
Potassium: 4.6 mmol/L (ref 3.5–5.1)
Sodium: 137 mmol/L (ref 135–145)

## 2019-05-25 LAB — CBC
HCT: 45.6 % (ref 39.0–52.0)
Hemoglobin: 15.2 g/dL (ref 13.0–17.0)
MCH: 32.3 pg (ref 26.0–34.0)
MCHC: 33.3 g/dL (ref 30.0–36.0)
MCV: 96.8 fL (ref 80.0–100.0)
Platelets: 195 10*3/uL (ref 150–400)
RBC: 4.71 MIL/uL (ref 4.22–5.81)
RDW: 13.3 % (ref 11.5–15.5)
WBC: 16.6 10*3/uL — ABNORMAL HIGH (ref 4.0–10.5)
nRBC: 0 % (ref 0.0–0.2)

## 2019-05-25 LAB — APTT: aPTT: 35 seconds (ref 24–36)

## 2019-05-25 LAB — MAGNESIUM: Magnesium: 2.2 mg/dL (ref 1.7–2.4)

## 2019-05-25 LAB — PROTIME-INR
INR: 1.2 (ref 0.8–1.2)
Prothrombin Time: 14.7 seconds (ref 11.4–15.2)

## 2019-05-25 IMAGING — DX PORTABLE ABDOMEN - 1 VIEW
1 series · 1 of 1 positions shown · non-contrast
Comparison: [DATE] CT

CLINICAL DATA: Status post abdominal aortic aneurysm repair

EXAM:
PORTABLE ABDOMEN - 1 VIEW

[abdomen kub]
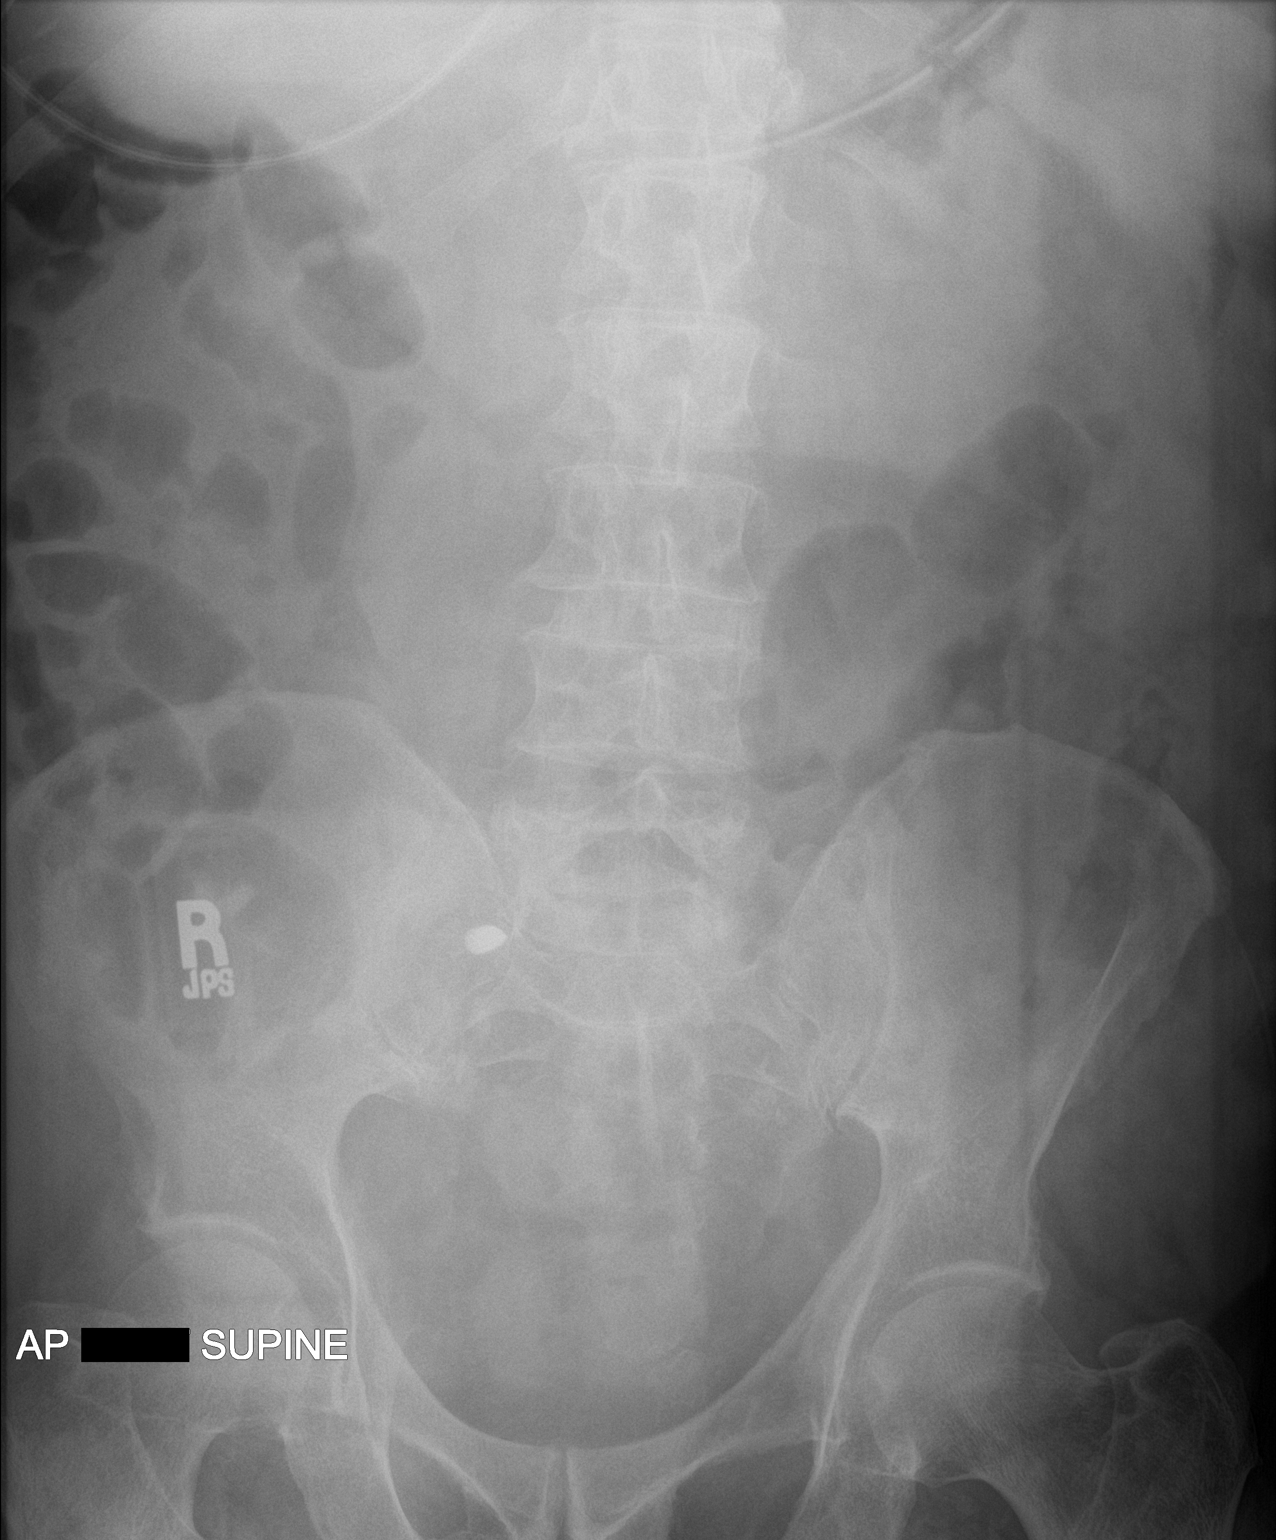

[1 of 1 positions shown; findings below may reference images not displayed]

FINDINGS: The bowel gas pattern is normal. Rounded calcification seen in the
right lower pelvis. No acute osseous abnormality. Tip of the NG tube
is seen projecting into the stomach.
IMPRESSION: Nonobstructive bowel gas pattern.

## 2019-05-25 IMAGING — DX PORTABLE CHEST - 1 VIEW
1 series · 1 of 1 positions shown · non-contrast
Comparison: None.

CLINICAL DATA: S/p aaa repair

EXAM:
PORTABLE CHEST 1 VIEW

[chest ap]
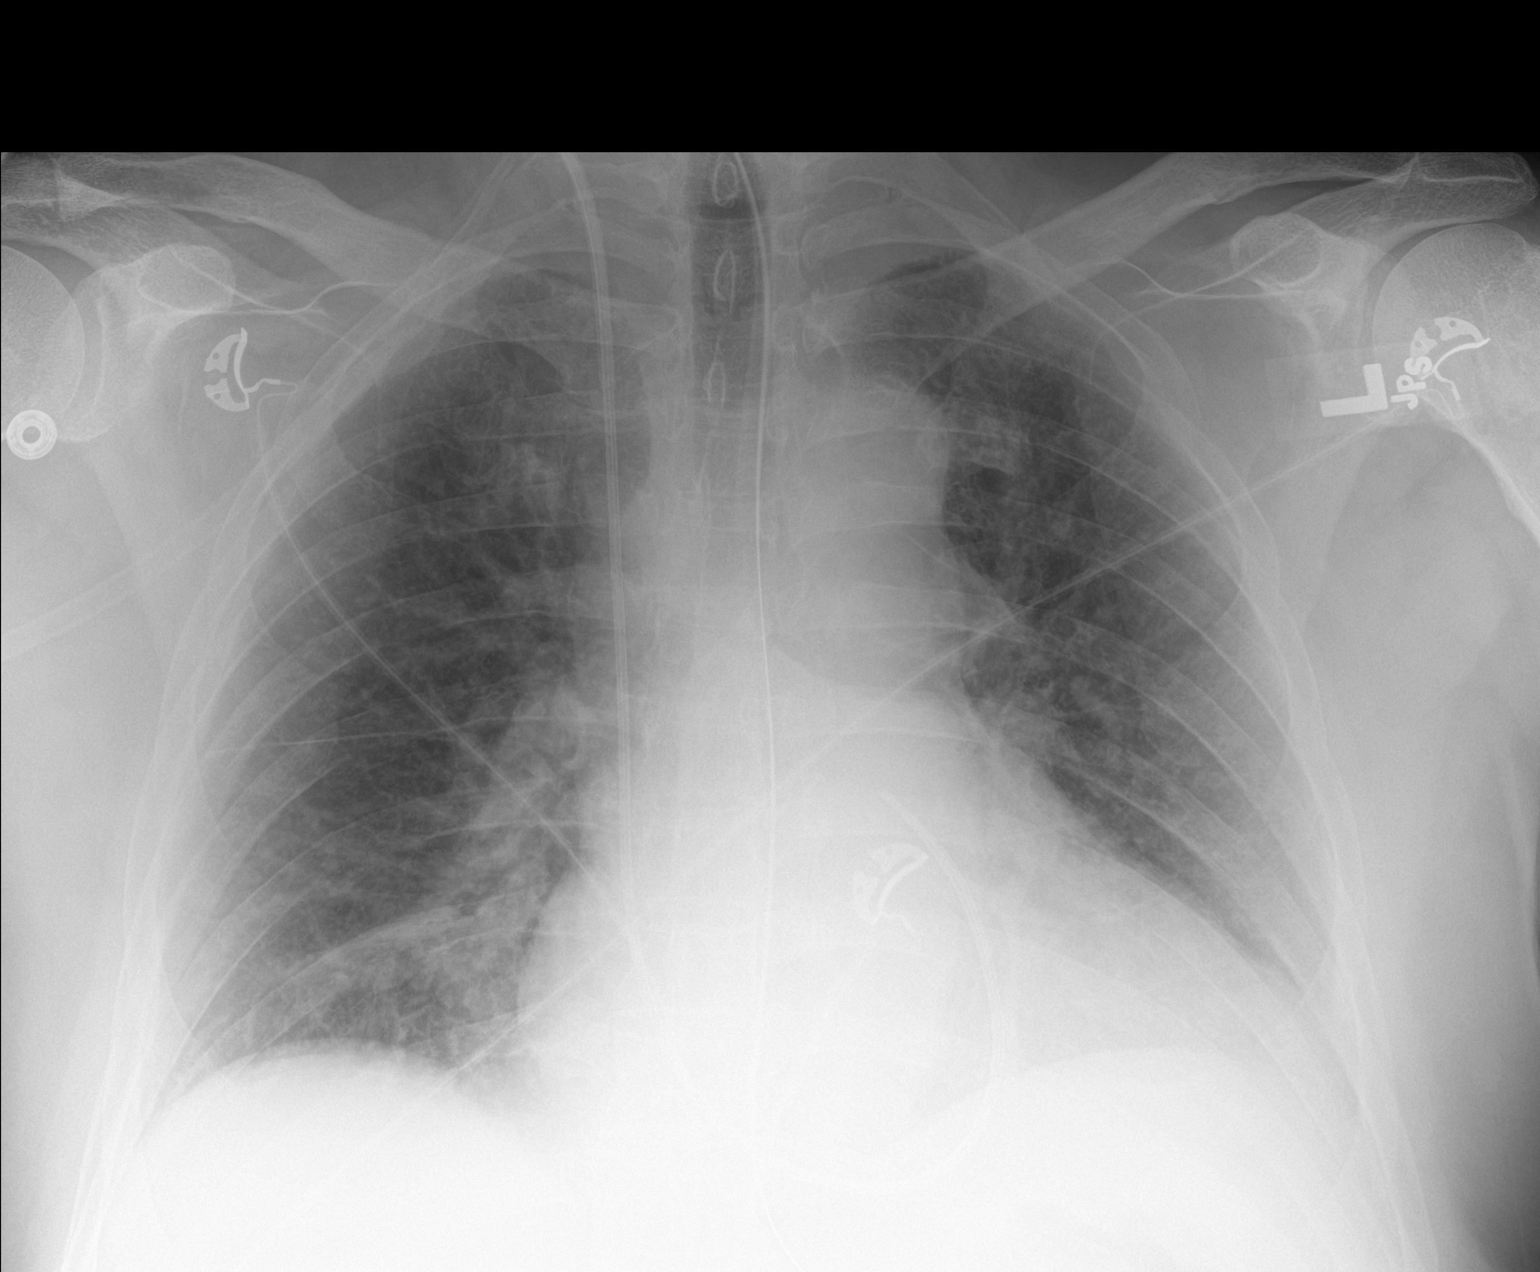

[1 of 1 positions shown; findings below may reference images not displayed]

FINDINGS: Right-sided pulmonary arterial catheter seen with the tip in the
left main pulmonary artery. NG tube is seen coursing below the
diaphragm. There is mild prominence to the pulmonary vasculature
throughout. Mild cardiomegaly. No pneumothorax.
IMPRESSION: Pulmonary vascular congestion

Mild cardiomegaly

Lines and tubes in satisfactory position

## 2019-05-25 SURGERY — ANEURYSM ABDOMINAL AORTIC REPAIR
Anesthesia: General | Site: Abdomen

## 2019-05-25 MED ORDER — BISACODYL 10 MG RE SUPP: 10.0000 mg | Freq: Every day | RECTAL | Status: DC | PRN

## 2019-05-25 MED ORDER — PANTOPRAZOLE SODIUM 40 MG IV SOLR
40.0000 mg | Freq: Every day | INTRAVENOUS | Status: DC
  Administered 2019-05-25 – 2019-05-28 (×4): 40 mg via INTRAVENOUS
  Filled 2019-05-25 (×4): qty 40

## 2019-05-25 MED ORDER — CHLORHEXIDINE GLUCONATE 4 % EX LIQD: 60.0000 mL | Freq: Once | CUTANEOUS | Status: DC

## 2019-05-25 MED ORDER — LATANOPROST 0.005 % OP SOLN
1.0000 [drp] | Freq: Every day | OPHTHALMIC | Status: DC
  Administered 2019-05-25 – 2019-05-27 (×3): 1 [drp] via OPHTHALMIC
  Filled 2019-05-25: qty 2.5

## 2019-05-25 MED ORDER — FENTANYL CITRATE (PF) 250 MCG/5ML IJ SOLN
INTRAMUSCULAR | Status: AC
  Filled 2019-05-25: qty 10

## 2019-05-25 MED ORDER — PHENYLEPHRINE 40 MCG/ML (10ML) SYRINGE FOR IV PUSH (FOR BLOOD PRESSURE SUPPORT)
PREFILLED_SYRINGE | INTRAVENOUS | Status: AC
  Filled 2019-05-25: qty 10

## 2019-05-25 MED ORDER — PROPOFOL 10 MG/ML IV BOLUS
INTRAVENOUS | Status: AC
  Filled 2019-05-25: qty 20

## 2019-05-25 MED ORDER — HYDROMORPHONE HCL 1 MG/ML IJ SOLN
INTRAMUSCULAR | Status: AC
  Administered 2019-05-25: 13:00:00 0.5 mg via INTRAVENOUS
  Filled 2019-05-25: qty 1

## 2019-05-25 MED ORDER — MANNITOL 25 % IV SOLN
INTRAVENOUS | Status: DC | PRN
  Administered 2019-05-25: 25 g via INTRAVENOUS

## 2019-05-25 MED ORDER — PROPOFOL 10 MG/ML IV BOLUS
INTRAVENOUS | Status: DC | PRN
  Administered 2019-05-25: 200 mg via INTRAVENOUS

## 2019-05-25 MED ORDER — ROCURONIUM BROMIDE 50 MG/5ML IV SOSY
PREFILLED_SYRINGE | INTRAVENOUS | Status: DC | PRN
  Administered 2019-05-25: 60 mg via INTRAVENOUS
  Administered 2019-05-25: 70 mg via INTRAVENOUS
  Administered 2019-05-25: 40 mg via INTRAVENOUS
  Administered 2019-05-25: 20 mg via INTRAVENOUS

## 2019-05-25 MED ORDER — HEPARIN SODIUM (PORCINE) 1000 UNIT/ML IJ SOLN
INTRAMUSCULAR | Status: AC
  Filled 2019-05-25: qty 1

## 2019-05-25 MED ORDER — HYDRALAZINE HCL 20 MG/ML IJ SOLN
5.0000 mg | INTRAMUSCULAR | Status: AC | PRN
  Administered 2019-05-25 (×2): 5 mg via INTRAVENOUS
  Filled 2019-05-25 (×2): qty 1

## 2019-05-25 MED ORDER — BRIMONIDINE TARTRATE 0.15 % OP SOLN
1.0000 [drp] | Freq: Two times a day (BID) | OPHTHALMIC | Status: DC
  Administered 2019-05-25 – 2019-05-29 (×5): 1 [drp] via OPHTHALMIC
  Filled 2019-05-25: qty 5

## 2019-05-25 MED ORDER — BRINZOLAMIDE 1 % OP SUSP
1.0000 [drp] | Freq: Two times a day (BID) | OPHTHALMIC | Status: DC
  Administered 2019-05-25 – 2019-05-29 (×5): 1 [drp] via OPHTHALMIC
  Filled 2019-05-25: qty 10

## 2019-05-25 MED ORDER — HYDROMORPHONE HCL 1 MG/ML IJ SOLN
0.5000 mg | INTRAMUSCULAR | Status: DC | PRN
  Administered 2019-05-25 – 2019-05-27 (×14): 1 mg via INTRAVENOUS
  Filled 2019-05-25 (×14): qty 1

## 2019-05-25 MED ORDER — MIDAZOLAM HCL 2 MG/2ML IJ SOLN
INTRAMUSCULAR | Status: AC
  Filled 2019-05-25: qty 2

## 2019-05-25 MED ORDER — MAGNESIUM SULFATE 2 GM/50ML IV SOLN: 2.0000 g | Freq: Once | INTRAVENOUS | Status: DC | PRN

## 2019-05-25 MED ORDER — CLEVIDIPINE BUTYRATE 0.5 MG/ML IV EMUL
INTRAVENOUS | Status: DC | PRN
  Administered 2019-05-25: 1 mg/h via INTRAVENOUS

## 2019-05-25 MED ORDER — DEXAMETHASONE SODIUM PHOSPHATE 10 MG/ML IJ SOLN
INTRAMUSCULAR | Status: DC | PRN
  Administered 2019-05-25: 10 mg via INTRAVENOUS

## 2019-05-25 MED ORDER — METOPROLOL TARTRATE 5 MG/5ML IV SOLN
2.5000 mg | Freq: Four times a day (QID) | INTRAVENOUS | Status: DC
  Administered 2019-05-25 – 2019-05-29 (×16): 2.5 mg via INTRAVENOUS
  Filled 2019-05-25 (×15): qty 5

## 2019-05-25 MED ORDER — DEXTROSE-NACL 5-0.45 % IV SOLN
INTRAVENOUS | Status: DC
  Administered 2019-05-25 – 2019-05-28 (×11): via INTRAVENOUS

## 2019-05-25 MED ORDER — PHENOL 1.4 % MT LIQD: 1.0000 | OROMUCOSAL | Status: DC | PRN

## 2019-05-25 MED ORDER — ALBUMIN HUMAN 5 % IV SOLN
INTRAVENOUS | Status: DC | PRN
  Administered 2019-05-25 (×5): via INTRAVENOUS

## 2019-05-25 MED ORDER — MIDAZOLAM HCL 2 MG/2ML IJ SOLN: 0.5000 mg | Freq: Once | INTRAMUSCULAR | Status: DC | PRN

## 2019-05-25 MED ORDER — LACTATED RINGERS IV SOLN
INTRAVENOUS | Status: DC | PRN
  Administered 2019-05-25 (×2): via INTRAVENOUS

## 2019-05-25 MED ORDER — SODIUM CHLORIDE 0.9 % IV SOLN
INTRAVENOUS | Status: DC
  Administered 2019-05-25: 09:00:00 via INTRAVENOUS

## 2019-05-25 MED ORDER — PANTOPRAZOLE SODIUM 40 MG PO TBEC: 40.0000 mg | DELAYED_RELEASE_TABLET | Freq: Every day | ORAL | Status: DC

## 2019-05-25 MED ORDER — SUCCINYLCHOLINE CHLORIDE 200 MG/10ML IV SOSY
PREFILLED_SYRINGE | INTRAVENOUS | Status: AC
  Filled 2019-05-25: qty 10

## 2019-05-25 MED ORDER — MIDAZOLAM HCL 5 MG/5ML IJ SOLN
INTRAMUSCULAR | Status: DC | PRN
  Administered 2019-05-25: 2 mg via INTRAVENOUS

## 2019-05-25 MED ORDER — ONDANSETRON HCL 4 MG/2ML IJ SOLN: 4.0000 mg | Freq: Four times a day (QID) | INTRAMUSCULAR | Status: DC | PRN

## 2019-05-25 MED ORDER — LIDOCAINE 2% (20 MG/ML) 5 ML SYRINGE
INTRAMUSCULAR | Status: AC
  Filled 2019-05-25: qty 5

## 2019-05-25 MED ORDER — ARTIFICIAL TEARS OPHTHALMIC OINT
TOPICAL_OINTMENT | OPHTHALMIC | Status: DC | PRN
  Administered 2019-05-25: 1 via OPHTHALMIC

## 2019-05-25 MED ORDER — SODIUM CHLORIDE 0.9 % IV SOLN
INTRAVENOUS | Status: DC | PRN
  Administered 2019-05-25: 09:00:00 500 mL

## 2019-05-25 MED ORDER — CEFAZOLIN SODIUM-DEXTROSE 2-4 GM/100ML-% IV SOLN
2.0000 g | Freq: Three times a day (TID) | INTRAVENOUS | Status: AC
  Administered 2019-05-25 (×2): 2 g via INTRAVENOUS
  Filled 2019-05-25 (×2): qty 100

## 2019-05-25 MED ORDER — SUGAMMADEX SODIUM 200 MG/2ML IV SOLN
INTRAVENOUS | Status: DC | PRN
  Administered 2019-05-25: 400 mg via INTRAVENOUS

## 2019-05-25 MED ORDER — PROTAMINE SULFATE 10 MG/ML IV SOLN
INTRAVENOUS | Status: DC | PRN
  Administered 2019-05-25 (×2): 10 mg via INTRAVENOUS
  Administered 2019-05-25: 30 mg via INTRAVENOUS

## 2019-05-25 MED ORDER — HEPARIN SODIUM (PORCINE) 1000 UNIT/ML IJ SOLN
INTRAMUSCULAR | Status: DC | PRN
  Administered 2019-05-25: 11000 [IU] via INTRAVENOUS

## 2019-05-25 MED ORDER — FENTANYL CITRATE (PF) 250 MCG/5ML IJ SOLN
INTRAMUSCULAR | Status: AC
  Filled 2019-05-25: qty 5

## 2019-05-25 MED ORDER — SODIUM CHLORIDE 0.9 % IV SOLN: 500.0000 mL | Freq: Once | INTRAVENOUS | Status: DC | PRN

## 2019-05-25 MED ORDER — HYDROMORPHONE HCL 1 MG/ML IJ SOLN
0.2500 mg | INTRAMUSCULAR | Status: DC | PRN
  Administered 2019-05-25 (×2): 0.5 mg via INTRAVENOUS

## 2019-05-25 MED ORDER — CEFAZOLIN SODIUM-DEXTROSE 2-4 GM/100ML-% IV SOLN
2.0000 g | INTRAVENOUS | Status: AC
  Administered 2019-05-25: 09:00:00 2 g via INTRAVENOUS
  Filled 2019-05-25: qty 100

## 2019-05-25 MED ORDER — SODIUM CHLORIDE 0.9 % IV SOLN
INTRAVENOUS | Status: DC | PRN
  Administered 2019-05-25: 50 ug/min via INTRAVENOUS
  Administered 2019-05-25: 25 ug/min via INTRAVENOUS

## 2019-05-25 MED ORDER — METOPROLOL TARTRATE 5 MG/5ML IV SOLN
2.0000 mg | INTRAVENOUS | Status: AC | PRN
  Administered 2019-05-25 – 2019-05-27 (×2): 5 mg via INTRAVENOUS
  Filled 2019-05-25 (×2): qty 5

## 2019-05-25 MED ORDER — ACETAMINOPHEN 325 MG RE SUPP: 325.0000 mg | RECTAL | Status: DC | PRN

## 2019-05-25 MED ORDER — 0.9 % SODIUM CHLORIDE (POUR BTL) OPTIME
TOPICAL | Status: DC | PRN
  Administered 2019-05-25: 1000 mL
  Administered 2019-05-25: 09:00:00 2000 mL

## 2019-05-25 MED ORDER — MEPERIDINE HCL 25 MG/ML IJ SOLN: 6.2500 mg | INTRAMUSCULAR | Status: DC | PRN

## 2019-05-25 MED ORDER — SODIUM CHLORIDE 0.9 % IV SOLN
INTRAVENOUS | Status: AC
  Filled 2019-05-25: qty 1.2

## 2019-05-25 MED ORDER — ONDANSETRON HCL 4 MG/2ML IJ SOLN
INTRAMUSCULAR | Status: DC | PRN
  Administered 2019-05-25: 4 mg via INTRAVENOUS

## 2019-05-25 MED ORDER — ACETAMINOPHEN 325 MG PO TABS: 325.0000 mg | ORAL_TABLET | ORAL | Status: DC | PRN

## 2019-05-25 MED ORDER — ROCURONIUM BROMIDE 10 MG/ML (PF) SYRINGE
PREFILLED_SYRINGE | INTRAVENOUS | Status: AC
  Filled 2019-05-25: qty 10

## 2019-05-25 MED ORDER — PROMETHAZINE HCL 25 MG/ML IJ SOLN: 6.2500 mg | INTRAMUSCULAR | Status: DC | PRN

## 2019-05-25 MED ORDER — FENTANYL CITRATE (PF) 100 MCG/2ML IJ SOLN
INTRAMUSCULAR | Status: DC | PRN
  Administered 2019-05-25: 100 ug via INTRAVENOUS
  Administered 2019-05-25 (×4): 50 ug via INTRAVENOUS
  Administered 2019-05-25: 250 ug via INTRAVENOUS
  Administered 2019-05-25: 100 ug via INTRAVENOUS
  Administered 2019-05-25 (×2): 50 ug via INTRAVENOUS

## 2019-05-25 MED ORDER — MORPHINE SULFATE (PF) 2 MG/ML IV SOLN
2.0000 mg | INTRAVENOUS | Status: DC | PRN
  Administered 2019-05-25 (×3): 5 mg via INTRAVENOUS
  Administered 2019-05-25 (×2): 4 mg via INTRAVENOUS
  Filled 2019-05-25 (×2): qty 2
  Filled 2019-05-25 (×4): qty 3

## 2019-05-25 MED ORDER — LIDOCAINE 2% (20 MG/ML) 5 ML SYRINGE
INTRAMUSCULAR | Status: DC | PRN
  Administered 2019-05-25: 60 mg via INTRAVENOUS

## 2019-05-25 MED ORDER — LABETALOL HCL 5 MG/ML IV SOLN
10.0000 mg | INTRAVENOUS | Status: AC | PRN
  Administered 2019-05-25 (×4): 10 mg via INTRAVENOUS
  Filled 2019-05-25 (×4): qty 4

## 2019-05-25 MED ORDER — DOCUSATE SODIUM 100 MG PO CAPS
100.0000 mg | ORAL_CAPSULE | Freq: Every day | ORAL | Status: DC
  Administered 2019-05-28: 100 mg via ORAL
  Filled 2019-05-25: qty 1

## 2019-05-25 SURGICAL SUPPLY — 55 items
ADH SKN CLS LQ APL DERMABOND (GAUZE/BANDAGES/DRESSINGS) ×1
CANISTER SUCT 3000ML PPV (MISCELLANEOUS) ×3 IMPLANT
CANNULA VESSEL 3MM 2 BLNT TIP (CANNULA) ×3 IMPLANT
CLIP LIGATING EXTRA MED SLVR (CLIP) ×3 IMPLANT
CLIP LIGATING EXTRA SM BLUE (MISCELLANEOUS) ×3 IMPLANT
COVER MAYO STAND STRL (DRAPES) ×2 IMPLANT
COVER WAND RF STERILE (DRAPES) ×1 IMPLANT
DERMABOND ADHESIVE PROPEN (GAUZE/BANDAGES/DRESSINGS) ×2
DERMABOND ADVANCED .7 DNX6 (GAUZE/BANDAGES/DRESSINGS) IMPLANT
ELECT BLADE 4.0 EZ CLEAN MEGAD (MISCELLANEOUS)
ELECT REM PT RETURN 9FT ADLT (ELECTROSURGICAL) ×3
ELECTRODE BLDE 4.0 EZ CLN MEGD (MISCELLANEOUS) IMPLANT
ELECTRODE REM PT RTRN 9FT ADLT (ELECTROSURGICAL) ×1 IMPLANT
FELT TEFLON 1X6 (MISCELLANEOUS) ×3 IMPLANT
GLOVE BIO SURGEON STRL SZ7 (GLOVE) ×2 IMPLANT
GLOVE BIO SURGEON STRL SZ7.5 (GLOVE) ×2 IMPLANT
GLOVE BIOGEL PI IND STRL 7.0 (GLOVE) IMPLANT
GLOVE BIOGEL PI IND STRL 7.5 (GLOVE) IMPLANT
GLOVE BIOGEL PI IND STRL 8 (GLOVE) IMPLANT
GLOVE BIOGEL PI INDICATOR 7.0 (GLOVE) ×6
GLOVE BIOGEL PI INDICATOR 7.5 (GLOVE) ×2
GLOVE BIOGEL PI INDICATOR 8 (GLOVE) ×2
GLOVE SS BIOGEL STRL SZ 7.5 (GLOVE) ×1 IMPLANT
GLOVE SUPERSENSE BIOGEL SZ 7.5 (GLOVE) ×2
GLOVE SURG SS PI 6.5 STRL IVOR (GLOVE) ×2 IMPLANT
GOWN STRL REUS W/ TWL LRG LVL3 (GOWN DISPOSABLE) ×3 IMPLANT
GOWN STRL REUS W/ TWL XL LVL3 (GOWN DISPOSABLE) IMPLANT
GOWN STRL REUS W/TWL LRG LVL3 (GOWN DISPOSABLE) ×12
GOWN STRL REUS W/TWL XL LVL3 (GOWN DISPOSABLE) ×3
GRAFT HEMASHIELD 16X8MM (Vascular Products) ×2 IMPLANT
INSERT FOGARTY 61MM (MISCELLANEOUS) ×6 IMPLANT
INSERT FOGARTY SM (MISCELLANEOUS) ×12 IMPLANT
KIT BASIN OR (CUSTOM PROCEDURE TRAY) ×3 IMPLANT
KIT TURNOVER KIT B (KITS) ×3 IMPLANT
NS IRRIG 1000ML POUR BTL (IV SOLUTION) ×8 IMPLANT
PACK AORTA (CUSTOM PROCEDURE TRAY) ×3 IMPLANT
PAD ARMBOARD 7.5X6 YLW CONV (MISCELLANEOUS) ×6 IMPLANT
SPONGE LAP 18X18 RF (DISPOSABLE) IMPLANT
STAPLER VISISTAT 35W (STAPLE) IMPLANT
SUT ETHIBOND 5 LR DA (SUTURE) IMPLANT
SUT PDS AB 1 TP1 54 (SUTURE) ×6 IMPLANT
SUT PROLENE 3 0 SH1 36 (SUTURE) ×6 IMPLANT
SUT PROLENE 5 0 C 1 24 (SUTURE) ×4 IMPLANT
SUT PROLENE 5 0 C 1 36 (SUTURE) ×4 IMPLANT
SUT PROLENE 6 0 CC (SUTURE) ×4 IMPLANT
SUT SILK 2 0 SH CR/8 (SUTURE) ×3 IMPLANT
SUT VIC AB 2-0 CT1 36 (SUTURE) ×6 IMPLANT
SUT VIC AB 3-0 SH 27 (SUTURE) ×6
SUT VIC AB 3-0 SH 27X BRD (SUTURE) ×2 IMPLANT
TOWEL GREEN STERILE (TOWEL DISPOSABLE) ×3 IMPLANT
TOWEL SURG RFD BLUE STRL DISP (DISPOSABLE) ×6 IMPLANT
TRAY FOLEY MTR SLVR 16FR STAT (SET/KITS/TRAYS/PACK) ×3 IMPLANT
TUBE CONNECTING 20'X1/4 (TUBING) ×1
TUBE CONNECTING 20X1/4 (TUBING) ×1 IMPLANT
WATER STERILE IRR 1000ML POUR (IV SOLUTION) ×4 IMPLANT

## 2019-05-25 NOTE — Anesthesia Procedure Notes (Signed)
Procedure Name: Intubation Date/Time: 05/25/2019 7:47 AM Performed by: Lavell Luster, CRNA Pre-anesthesia Checklist: Patient identified, Emergency Drugs available, Suction available, Patient being monitored and Timeout performed Patient Re-evaluated:Patient Re-evaluated prior to induction Oxygen Delivery Method: Circle system utilized Preoxygenation: Pre-oxygenation with 100% oxygen Induction Type: IV induction Ventilation: Mask ventilation without difficulty Laryngoscope Size: Mac and 4 Grade View: Grade I Tube type: Oral Tube size: 7.5 mm Number of attempts: 1 Airway Equipment and Method: Stylet Placement Confirmation: ETT inserted through vocal cords under direct vision,  positive ETCO2 and breath sounds checked- equal and bilateral Secured at: 22 cm Tube secured with: Tape Dental Injury: Teeth and Oropharynx as per pre-operative assessment

## 2019-05-25 NOTE — Anesthesia Procedure Notes (Signed)
Arterial Line Insertion Start/End7/22/2020 7:00 AM, 05/25/2019 7:10 AM Performed by: Lavell Luster, CRNA, CRNA  Preanesthetic checklist: patient identified, IV checked, site marked, risks and benefits discussed, surgical consent, monitors and equipment checked, pre-op evaluation and timeout performed Lidocaine 1% used for infiltration Left, radial was placed Catheter size: 20 G Hand hygiene performed , maximum sterile barriers used  and Seldinger technique used Allen's test indicative of satisfactory collateral circulation Attempts: 1 Procedure performed without using ultrasound guided technique. Following insertion, dressing applied and Biopatch. Post procedure assessment: normal  Patient tolerated the procedure well with no immediate complications.

## 2019-05-25 NOTE — H&P (Signed)
Office Visit  04/19/2019 [facility]   [name], [name], MD Vascular Surgery  AAA (abdominal aortic aneurysm) without rupture [facility](HCC) Dx  Follow-up ; Referred by Quitman LivingsHassan, [name], MD Reason for Visit  Additional Documentation  Vitals:   BP 133/95 (BP Location: Left Arm, Patient Position: Sitting, Cuff Size: Large)  Pulse 65  Temp 97.4 F (36.3 C)   Resp 20  Ht 6' (1.829 m)  Wt 110.2 kg  SpO2 95%  BMI 32.96 kg/m  BSA 2.37 m  Flowsheets:   Clinical Intake,  Healthcare Directives,  Vital Signs,  MEWS Score,  Anthropometrics,  Method of Visit    Encounter Info:   Billing Info,  History,  Allergies,  Detailed Report    All Notes  Progress Notes by Larina EarthlyEarly, Calahan Pak F, MD at 04/19/2019 9:00 AM Author: Larina EarthlyEarly, Shady Padron F, MD Author Type: Physician Filed: 04/19/2019 4:18 PM  Note Status: Signed Cosign: Cosign Not Required Encounter Date: 04/19/2019  Editor: Larina EarthlyEarly, Cutter Passey F, MD (Physician)                                          [facility]  Patient name: Tyler Burns            MRN: [id number]        DOB: [birth year]          Sex: male  REASON FOR VISIT: Continued follow-up of abdominal aortic aneurysm  HPI: Tyler Burns is a 63 y.o. male here today for continued follow-up.  He has known history of non-aortic aneurysm and underwent recent CT scan for continued follow-up.  He has no new medical difficulties.  He has not had any cardiac difficulties.  He does have irregular heart rate.  He is not on any anticoagulation.      Past Medical History:  Diagnosis Date  . Asthma   . Dysrhythmia   . Glaucoma   . History of cardiovascular stress test    Myoview 7/16:  EF 54%, small area of moderate inferoseptal ischemia, apical thinning, Low Risk  . Hypertension          Family History  Problem Relation Age of Onset  . Congestive Heart Failure Mother   . Heart attack Mother 5855  . Heart attack Brother 45  .  Leukemia Brother   . Heart failure Sister 3970  . Stroke Neg Hx     SOCIAL HISTORY: Social History        Tobacco Use  . Smoking status: Former Smoker    Quit date: 10/27/1992    Years since quitting: 26.4  . Smokeless tobacco: Never Used  Substance Use Topics  . Alcohol use: No         Allergies  Allergen Reactions  . Brinzolamide-Brimonidine Shortness Of Breath    Feels he has difficulty breathing when on medication          Current Outpatient Medications  Medication Sig Dispense Refill  . aspirin EC 81 MG tablet Take 1 tablet (81 mg total) by mouth daily. 90 tablet 3  . bimatoprost (LUMIGAN) 0.01 % SOLN Place 1 drop into both eyes 2 (two) times daily.     . cetirizine (ZYRTEC) 10 MG tablet Take 1 tablet (10 mg total) by mouth daily. One tab daily for allergies 30 tablet 1  . cyanocobalamin ([employer] VITAMIN B12) 2000  MCG tablet Take 2,000 mcg by mouth daily.     . furosemide (LASIX) 40 MG tablet Take 1 tablet (40 mg total) by mouth daily. 90 tablet 1  . hydrALAZINE (APRESOLINE) 50 MG tablet Take 50 mg by mouth 2 (two) times daily.    Marland Kitchen lisinopril (PRINIVIL,ZESTRIL) 20 MG tablet Take 20 mg by mouth daily.   0  . lovastatin (MEVACOR) 40 MG tablet   10  . metoprolol succinate (TOPROL XL) 25 MG 24 hr tablet Take 1 tablet (25 mg total) by mouth daily. 30 tablet 11  . Omega-3 Fatty Acids (FISH OIL PO) Take 1 tablet by mouth daily.    . vitamin E 400 UNIT capsule Take 400 Units by mouth daily.      No current facility-administered medications for this visit.     REVIEW OF SYSTEMS:  [X]  denotes positive finding, [ ]  denotes negative finding Cardiac  Comments:  Chest pain or chest pressure:    Shortness of breath upon exertion:    Short of breath when lying flat:    Irregular heart rhythm:        Vascular    Pain in calf, thigh, or hip brought on by ambulation:    Pain in feet at night that wakes you up from your sleep:     Blood  clot in your veins:    Leg swelling:           PHYSICAL EXAM:    Vitals:   04/19/19 0858  BP: (!) 133/95  Pulse: 65  Resp: 20  Temp: (!) 97.4 F (36.3 C)  SpO2: 95%  Weight: 110.2 kg  Height: 6' (1.829 m)    GENERAL: The patient is a well-nourished male, in no acute distress. The vital signs are documented above. CARDIOVASCULAR: Carotid arteries without bruits bilaterally.  2+ radial and 2+ popliteal pulses.  Abdomen moderately obese with no aneurysm palpable PULMONARY: There is good air exchange  MUSCULOSKELETAL: There are no major deformities or cyanosis. NEUROLOGIC: No focal weakness or paresthesias are detected. SKIN: There are no ulcers or rashes noted. PSYCHIATRIC: The patient has a normal affect.  DATA:  CT scan shows maximal diameter of 5.5 cm with a significant growth.  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  MEDICAL ISSUES: I had a long discussion with the patient.  I have recommended elective repair both due to the size of his aneurysm and also a more rapid growth and would be expected.  I discussed open aneurysm repair.  He does have mural thrombus extending up to the renal arteries making his proximal attachment somewhat precarious.  He does not want to entertain a stent graft.  He wants to proceed with open aneurysm repair and I feel that this is appropriate.  I discussed the magnitude of procedure including 1-2 nights in the intensive care and hospitalization of 5 to 7 days.  Also explained return of bowel function and requirements for discharge to home.  He wishes to proceed as soon as possible.  We will check with cardiology.  He has had low risk studies in the past.    [name], MD [facility] [phone] Pager [phone]     Addendum:  The patient has been re-examined and re-evaluated.  The patient's history and physical has been reviewed and is  unchanged.    Tyler Burns is a 63 y.o. male is being admitted  with ABDOMINAL AORTIC ANEURYSM. All the risks, benefits and other treatment options have been discussed with the patient. The patient has consented to proceed with Procedure(s): ANEURYSM ABDOMINAL AORTIC REPAIR as a surgical intervention.  Emberlie Gotcher 05/25/2019 7:18 AM Vascular and Vein Surgery

## 2019-05-25 NOTE — Progress Notes (Signed)
  Day of Surgery Note    Subjective:  No complaints in pacu   Vitals:   05/25/19 1241 05/25/19 1256  BP: (!) 138/97 (!) 143/82  Pulse: 74 91  Resp: 18 17  Temp:    SpO2: 95% 93%    Incisions:   Laparotomy incision is clean and dry Extremities:  Easily palpable DP pulses bilaterally Cardiac:  regular Lungs:  Non labored Abdomen:  Soft, obese, NT to palpation   Assessment/Plan:  This is a 63 y.o. male who is s/p  aortobiliac bypass grafting  -pt doing well in recovery room & hemodynamically stable -pt with easily palpable DP pulses bilaterally  -continue npo -pt to 2 CVICU shortly     Leontine Locket, PA-C 05/25/2019 1:07 PM [phone]

## 2019-05-25 NOTE — Progress Notes (Signed)
MD notified that pt refused mg Morphine PRN due to ineffectiveness. MD gave verbal order for 0.5 to 1 mg hydromorphone every 1-2 hours as needed.

## 2019-05-25 NOTE — Anesthesia Postprocedure Evaluation (Signed)
Anesthesia Post Note  Patient: Tyler Burns  Procedure(s) Performed: ANEURYSM ABDOMINAL AORTIC REPAIR using a HEMASHIELD GOLD VASCULAR GRAFT (N/A Abdomen)     Patient location during evaluation: PACU Anesthesia Type: General Level of consciousness: oriented, patient cooperative and sedated Pain management: pain level controlled (pain much improved) Vital Signs Assessment: post-procedure vital signs reviewed and stable Respiratory status: spontaneous breathing, nonlabored ventilation, respiratory function stable and patient connected to nasal cannula oxygen Cardiovascular status: blood pressure returned to baseline and stable Postop Assessment: no apparent nausea or vomiting Anesthetic complications: no    Last Vitals:  Vitals:   05/25/19 1311 05/25/19 1324  BP: (!) 145/88 (!) 142/103  Pulse: 75 80  Resp: 13 15  Temp:  36.7 C  SpO2: 96% 97%    Last Pain:  Vitals:   05/25/19 1325  TempSrc:   PainSc: Asleep                 Misty Rago,E. Melisia Leming

## 2019-05-25 NOTE — Discharge Instructions (Signed)
 [facility] [place]  Discharge Instructions   Open Aortic Surgery  Please refer to the following instructions for your post-procedure care. Your surgeon or Physician Assistant will discuss any changes with you.  Activity  Avoid lifting more than eight pounds (a gallon of milk) until after your first post-operative visit. You are encouraged to walk as much as you can. You can slowly return to normal activities but must avoid strenuous activity and heavy lifting until your doctor tells you it's okay. Heavy lifting can hurt the incision and cause a hernia. Avoid activities such as vacuuming or swinging a golf club. It is normal to feel tired for several weeks after your surgery. Do not drive until your doctor gives the okay and you are no longer taking prescription pain medications. It is also normal to have difficulty with sleep habits, eating and bowl movements after surgery. These will go away with time.  Bathing/Showering  Shower daily after you go home. Do not soak in a bathtub, hot tub, or swim until the incision heals.  Incision Care  Shower every day. Clean your incision with mild soap and water. Pat the area dry with a clean towel. You do not need a bandage unless otherwise instructed. Do not apply any ointments or creams to your incision. You may have skin glue on your incision. Do not peel it off. It will come off on its own in about one week. If you have staples or sutures along your incision, they will be removed at your post op appointment.  If you have groin incisions, wash the groin wounds with soap and water daily and pat dry. (No tub bath-only shower)  Then put a dry gauze or washcloth in the groin to keep this area dry to help prevent wound infection.  Do this daily and as needed.  Do not use Vaseline or neosporin on your incisions.  Only use soap and water on your incisions and then protect and keep dry.  Diet  Resume your normal diet. There are no  special food restriction following this procedure. A low fat/low cholesterol diet is recommended for all patients with vascular disease. After your aortic surgery, it's normal to feel full faster than usual and to not feel as hungry as you normally would. You will probably lose weight initially following your surgery. It's best to eat small, frequent meals over the course of the day. Call the office if you find that you are unable to eat even small meals.   In order to heal from your surgery, it is CRITICAL to get adequate nutrition. Your body requires vitamins, minerals, and protein. Vegetables are the best source of vitamins and minerals. If you have pain, you may take over-the-counter pain reliever such as acetaminophen (Tylenol). If you were prescribed a stronger pain medication, please be aware these medication can cause nausea and constipation. Prevent nausea by taking the medication with a snack or meal. Avoid constipation by drinking plenty of fluids and eating foods with a high amount of fiber, such as fruits, vegetables and grains. Take 100mg of the over-the-counter stool softener Colace twice a day as needed to help with constipation. A laxative, such as Milk of Magnesia, may be recommended for you at this time. Do not take a laxative unless your surgeon or P.A. tells you it's OK.  Do not take Tylenol if you are taking stronger pain medications (such as Percocet).  Follow Up  Our office will schedule a follow up   appointment 2-3 weeks after discharge.  Please call us immediately for any of the following conditions    .     Severe or worsening pain in your legs or feet or in your abdomen back or chest. Increased pain, redness drainage (pus) from your incision site. Increased abdominal pain, bloating, nausea, vomiting, or persistent diarrhea. Fever of 101 degrees or higher. Swelling in your leg (s).  Reduce your risk of vascular disease  Stop smoking. If you would like help, call  [facility] at 1-800-QUIT-NOW (1-800-784-8669) or [place] at [phone]. Manage your cholesterol Maintain a desired weight Control your diabetes Keep your blood pressure down  If you have any questions please call the office at [phone].   

## 2019-05-25 NOTE — Anesthesia Procedure Notes (Signed)
Central Venous Catheter Insertion Performed by: Annye Asa, MD, anesthesiologist Start/End7/29/2020 6:52 AM, 05/25/2019 7:07 AM Patient location: Pre-op. Preanesthetic checklist: patient identified, IV checked, risks and benefits discussed, surgical consent, monitors and equipment checked, pre-op evaluation, timeout performed and anesthesia consent Position: Trendelenburg Lidocaine 1% used for infiltration and patient sedated Hand hygiene performed , maximum sterile barriers used  and Seldinger technique used Catheter size: 8.5 Fr PA cath was placed.Sheath introducer Swan type:thermodilution Procedure performed using ultrasound guided technique. Ultrasound Notes:anatomy identified, needle tip was noted to be adjacent to the nerve/plexus identified, no ultrasound evidence of intravascular and/or intraneural injection and image(s) printed for medical record Attempts: 1 Following insertion, line sutured, dressing applied and Biopatch. Post procedure assessment: blood return through all ports, free fluid flow and no air  Patient tolerated the procedure well with no immediate complications. Additional procedure comments: PA catheter:  Routine monitors. Timeout, sterile prep, drape, FBP R neck.  Trendelenburg position.  1% Lido local, finder and trocar RIJ 1st pass with US guidance.  Cordis placed over J wire. PA catheter in easily.  Sterile dressing applied.  Patient tolerated well, VSS.  Jenita Seashore, MD.

## 2019-05-25 NOTE — Op Note (Signed)
OPERATIVE REPORT  DATE OF SURGERY: 05/25/2019  PATIENT: Tyler Burns, 63 y.o. male MRN: [id number]  DOB: 08-02-1956  PRE-OPERATIVE DIAGNOSIS: Juxtarenal aortic aneurysm  POST-OPERATIVE DIAGNOSIS:  Same  PROCEDURE: Resection grafting with non-aortic aneurysm repair with 16 x 8 Hemashield aorto to bilateral common iliac artery bypass  SURGEON:  Curt Jews, M.D.  PHYSICIAN ASSISTANT: Dr. Monica Martinez, Liana Crocker, PA-C  ANESTHESIA: General  EBL: per anesthesia record  Total I/O In: 6834 [I.V.:2000; Blood:330; IV Piggyback:1000] Out: 1230 [Urine:530; Blood:700]  BLOOD ADMINISTERED: none  DRAINS: none  SPECIMEN: none  COUNTS CORRECT:  YES  PATIENT DISPOSITION:  PACU - hemodynamically stable  PROCEDURE DETAILS: Patient was taken operating placed supine position where the area of the abdomen was prepped and draped in usual sterile fashion.  Incision was made from the level of the xiphoid to the pubis and carried down through the midline fascia with electrocautery.  The midline fascia was opened in line with the skin incision.  The peritoneum was entered and the abdomen was explored.  The Balfour retractor was positioned and then the Omni-Tract retractor was used for exposure.  The transverse colon and omentum were reflected superiorly and the small bowel was reflected to the right.  The duodenum was mobilized off the aorta.  Dissection was continued up to the level of the left renal vein.  Dissection of the aorta above the renal arteries was accomplished and the right and left renal arteries were encircled with blue vessel loops.  The patient had thrombus extending up to the level of the renal arteries.  Dissection was then continued down over the aortic aneurysm to the aortic bifurcation.  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  The artery was ligated.  Dissection was continued over the  aortic bifurcation extending down to the junction of the internal and external iliac arteries bilaterally.  Patient did have ectasia in the more proximal common iliac arteries bilaterally.  The patient was given 25 g of mannitol and 11,000 units of intravenous heparin.  After adequate circulation time the renal arteries were occluded with Serafin clamps and the suprarenal aorta was clamped with Zanger clamp.  The distal common iliac arteries were occluded bilaterally with Henley clamps.  The aorta was opened with an 11 blade and sent longitudinally with Potts scissors.  Mural thrombus was removed.  There were no lumbar back bleeders.  The aorta was transected below the level of the renal arteries and the marrow thrombus and debris was removed from the lumen.  The right and left renal arteries were tested for backbleeding and this was adequate.  A 16 x 8 Hemashield graft was brought onto the field.  A felt strip was used for reinforcement and the aortic graft cut to the appropriate length.  The graft was sewn into into the aorta below the level of the renal arteries with a running 3-0 Prolene suture using a felt strip for reinforcement.  The anastomosis was tested and found to be adequate.  The renal arteries were flushed and the graft was occluded and the proximal clamp was removed.  Renal ischemia time was 25 minutes.  The right and left limbs of the graft were then brought down to the appropriate common iliac artery bilaterally.  Common iliac arteries were partially transected and the limbs of the graft cut the appropriate length.  The right and left limb of bifurcated graft were  sewn to the distal common iliac arteries bilaterally with running 5-0 Prolene sutures.  Prior to completion of each anastomosis the anastomosis was completed and flow was restored.  The patient was given 50 mg protamine to reverse the heparin.  There was excellent flow characteristics in the renal arteries bilaterally.  Wounds irrigated  with saline.  Hemostasis to electrocautery.  Wounds were closed with 2-0 Vicryl in a running fashion to close the aortic wall over the aortic graft.  The retroperitoneum was closed with a running 2-0 Vicryl suture.  The retractors were removed.  The small bowel was run in its entirety and found to be without injury was placed back in the pelvis.  The transverse colon omentum were placed over this.  Patient did have an umbilical hernia and the hernia sac was resected and the fascia was mobilized for adequate closure.  The midline fascia was closed with a #1 PDS suture beginning proximally distally and tying in the middle.  Wound again was irrigated with saline.  The wound skin was closed with running 3-0 Vicryl suture.  Sterile dressing was applied.  The patient was extubated and transferred to the recovery room in stable condition   Larina Earthlyodd F. [place], M.D., [facility] Hospital[facility] 05/25/2019 1:31 PM

## 2019-05-25 NOTE — Progress Notes (Signed)
Patient ID: Tyler Burns, male   DOB: [birth year], 63 y.o.   MRN: [id number] Looks great in the ICU.  Comfortable.  Extubated.  Minimal discomfort. Abdomen soft 2-3+ dorsalis pedis pulses bilaterally

## 2019-05-25 NOTE — Transfer of Care (Signed)
Immediate Anesthesia Transfer of Care Note  Patient: Tyler Burns  Procedure(s) Performed: ANEURYSM ABDOMINAL AORTIC REPAIR using a HEMASHIELD GOLD VASCULAR GRAFT (N/A Abdomen)  Patient Location: PACU  Anesthesia Type:General  Level of Consciousness: awake, alert  and oriented  Airway & Oxygen Therapy: Patient connected to nasal cannula oxygen  Post-op Assessment: Post -op Vital signs reviewed and stable  Post vital signs: stable  Last Vitals:  Vitals Value Taken Time  BP 134/103 05/25/19 1226  Temp    Pulse 63 05/25/19 1231  Resp 16 05/25/19 1231  SpO2 93 % 05/25/19 1231  Vitals shown include unvalidated device data.  Last Pain:  Vitals:   05/25/19 0645  TempSrc:   PainSc: 0-No pain         Complications: No apparent anesthesia complications

## 2019-05-26 ENCOUNTER — Encounter (HOSPITAL_COMMUNITY): Payer: Self-pay | Admitting: Vascular Surgery

## 2019-05-26 ENCOUNTER — Inpatient Hospital Stay (HOSPITAL_COMMUNITY): Payer: Medicare Other

## 2019-05-26 DIAGNOSIS — E785 Hyperlipidemia, unspecified: Secondary | ICD-10-CM

## 2019-05-26 DIAGNOSIS — I482 Chronic atrial fibrillation, unspecified: Secondary | ICD-10-CM

## 2019-05-26 LAB — CBC
HCT: 42.6 % (ref 39.0–52.0)
Hemoglobin: 14.3 g/dL (ref 13.0–17.0)
MCH: 31.8 pg (ref 26.0–34.0)
MCHC: 33.6 g/dL (ref 30.0–36.0)
MCV: 94.9 fL (ref 80.0–100.0)
Platelets: 182 10*3/uL (ref 150–400)
RBC: 4.49 MIL/uL (ref 4.22–5.81)
RDW: 13.5 % (ref 11.5–15.5)
WBC: 15.2 10*3/uL — ABNORMAL HIGH (ref 4.0–10.5)
nRBC: 0 % (ref 0.0–0.2)

## 2019-05-26 LAB — COMPREHENSIVE METABOLIC PANEL
ALT: 19 U/L (ref 0–44)
AST: 24 U/L (ref 15–41)
Albumin: 3.8 g/dL (ref 3.5–5.0)
Alkaline Phosphatase: 43 U/L (ref 38–126)
Anion gap: 7 (ref 5–15)
BUN: 17 mg/dL (ref 8–23)
CO2: 23 mmol/L (ref 22–32)
Calcium: 8.4 mg/dL — ABNORMAL LOW (ref 8.9–10.3)
Chloride: 104 mmol/L (ref 98–111)
Creatinine, Ser: 1.11 mg/dL (ref 0.61–1.24)
GFR calc Af Amer: >60 mL/min (ref >60)
GFR calc non Af Amer: >60 mL/min (ref >60)
Glucose, Bld: 171 mg/dL — ABNORMAL HIGH (ref 70–99)
Potassium: 4.2 mmol/L (ref 3.5–5.1)
Sodium: 134 mmol/L — ABNORMAL LOW (ref 135–145)
Total Bilirubin: 1.2 mg/dL (ref 0.3–1.2)
Total Protein: 6.1 g/dL — ABNORMAL LOW (ref 6.5–8.1)

## 2019-05-26 LAB — GLUCOSE, CAPILLARY
Glucose-Capillary: 142 mg/dL — ABNORMAL HIGH (ref 70–99)
Glucose-Capillary: 135 mg/dL — ABNORMAL HIGH (ref 70–99)

## 2019-05-26 LAB — AMYLASE: Amylase: 28 U/L (ref 28–100)

## 2019-05-26 LAB — MAGNESIUM: Magnesium: 2.3 mg/dL (ref 1.7–2.4)

## 2019-05-26 IMAGING — DX PORTABLE CHEST - 1 VIEW
1 series · 1 of 1 positions shown · non-contrast
Comparison: Yesterday

CLINICAL DATA: Abdominal aortic aneurysm repair.

EXAM:
PORTABLE CHEST 1 VIEW

[chest ap]
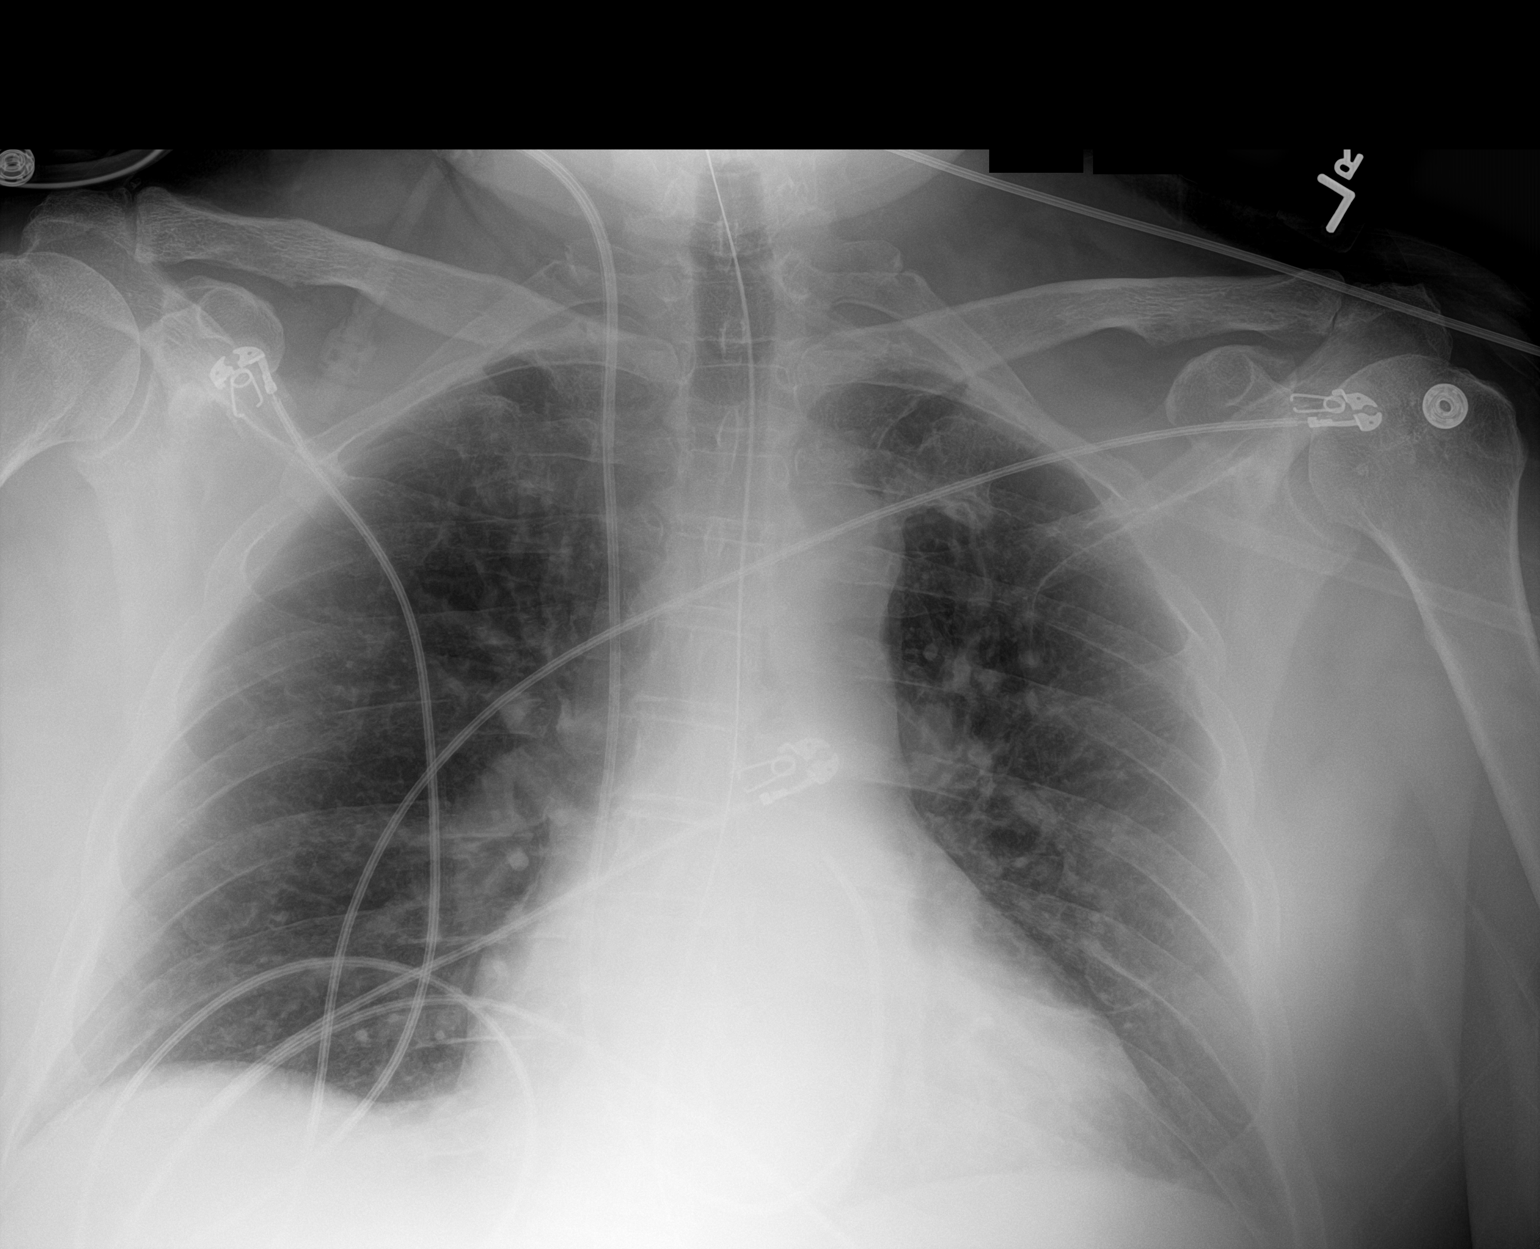

[1 of 1 positions shown; findings below may reference images not displayed]

FINDINGS: Swan-Ganz catheter with tip in the region of the pulmonary outflow
tract. Enteric tube at least crosses the diaphragm. There is no
edema, consolidation, effusion, or pneumothorax. Normal heart size
and mediastinal contours.
IMPRESSION: Improved vascular congestion. No evidence of acute cardiopulmonary
disease.

## 2019-05-26 MED ORDER — CHLORHEXIDINE GLUCONATE CLOTH 2 % EX PADS
6.0000 | MEDICATED_PAD | Freq: Every day | CUTANEOUS | Status: DC
  Administered 2019-05-26 – 2019-05-29 (×4): 6 via TOPICAL

## 2019-05-26 NOTE — Evaluation (Signed)
Occupational Therapy Evaluation Patient Details Name: Tyler Burns MRN: [id number] DOB: [birth year] Today's Date: 05/26/2019    History of Present Illness Pt is a 63 y/o male s/p Aneurysm Abdominal Aortic Repair. Pt has a PMH including Asthma, Dysrhythmia, Glaucoma, HTN.   Clinical Impression   PTA Pt independent in ADL and mobility. Pt is currently max A for LB ADL. Min A for grooming, min guard assist +2 for line management for transfers. Pt will benefit from skilled OT in the acute setting, and anticipate that he will not need OT follow up - has 24 hour assist from live-in girlfriend who is a CNA. Next session focus on transfers as well as AE education for LB ADL and compensatory strategies for abdominal pain.    Follow Up Recommendations  Home health OT;Supervision/Assistance - 24 hour    Equipment Recommendations  None recommended by OT(PT has appropriate DME; potentially AE)    Recommendations for Other Services       Precautions / Restrictions Precautions Precaution Comments: NG Tube Restrictions Weight Bearing Restrictions: No      Mobility Bed Mobility Overal bed mobility: Needs Assistance Bed Mobility: Supine to Sit     Supine to sit: Min assist;HOB elevated     General bed mobility comments: Pt educated on log roll technique, but performed supine to sit due to proximity to edge with assist for trunk elevation from therapist  Transfers Overall transfer level: Needs assistance Equipment used: Rolling walker (2 wheeled) Transfers: Sit to/from UGI CorporationStand;Stand Pivot Transfers Sit to Stand: Min guard;+2 safety/equipment Stand pivot transfers: Min guard;+2 safety/equipment       General transfer comment: +2 for line management, vc for safe hand placement    Balance Overall balance assessment: Mild deficits observed, not formally tested                                         ADL either performed or assessed with clinical judgement   ADL  Overall ADL's : Needs assistance/impaired Eating/Feeding: NPO   Grooming: Minimal assistance;Sitting   Upper Body Bathing: Minimal assistance;Sitting   Lower Body Bathing: Moderate assistance;Sitting/lateral leans   Upper Body Dressing : Minimal assistance;Sitting   Lower Body Dressing: Maximal assistance;Bed level Lower Body Dressing Details (indicate cue type and reason): to don socks Toilet Transfer: Min guard;Minimal assistance;+2 for safety/equipment;BSC Toilet Transfer Details (indicate cue type and reason): simulated through recliner transfer Toileting- Clothing Manipulation and Hygiene: Moderate assistance;Sit to/from stand       Functional mobility during ADLs: Min guard;Rolling walker General ADL Comments: limited access to LB for ADL due to abdominal incision     Vision Baseline Vision/History: Wears glasses Wears Glasses: Distance only(mostly for driving) Patient Visual Report: No change from baseline Vision Assessment?: No apparent visual deficits     Perception     Praxis      Pertinent Vitals/Pain Pain Assessment: 0-10 Pain Score: 9  Pain Location: abdomen Pain Descriptors / Indicators: Constant;Discomfort;Sore Pain Intervention(s): Limited activity within patient's tolerance;Monitored during session;Repositioned     Hand Dominance Right   Extremity/Trunk Assessment Upper Extremity Assessment Upper Extremity Assessment: Overall WFL for tasks assessed   Lower Extremity Assessment Lower Extremity Assessment: Defer to PT evaluation   Cervical / Trunk Assessment Cervical / Trunk Assessment: Other exceptions Cervical / Trunk Exceptions: s/p abdominal incision   Communication Communication Communication: No difficulties   Cognition Arousal/Alertness: Awake/alert Behavior During  Therapy: WFL for tasks assessed/performed Overall Cognitive Status: Within Functional Limits for tasks assessed                                     General  Comments  VSS throughout session    Exercises     Shoulder Instructions      Home Living Family/patient expects to be discharged to:: Private residence Living Arrangements: Spouse/significant other(live in girlfriend is a CNA) Available Help at Discharge: Friend(s);Available 24 hours/day Type of Home: House Home Access: Ramped entrance     Home Layout: One level     Bathroom Shower/Tub: Producer, television/film/videoWalk-in shower   Bathroom Toilet: Standard Bathroom Accessibility: Yes How Accessible: Accessible via walker Home Equipment: Walker - 2 wheels;Bedside commode;Shower seat;Hand held shower head;Hospital bed          Prior Functioning/Environment Level of Independence: Independent                 OT Problem List: Decreased range of motion;Decreased activity tolerance;Impaired balance (sitting and/or standing);Decreased knowledge of use of DME or AE;Decreased knowledge of precautions;Cardiopulmonary status limiting activity;Obesity;Pain      OT Treatment/Interventions: Self-care/ADL training;Energy conservation;DME and/or AE instruction;Therapeutic activities;Patient/family education;Balance training    OT Goals(Current goals can be found in the care plan section) Acute Rehab OT Goals Patient Stated Goal: "Get all these tubes out" and get back to independent OT Goal Formulation: With patient Time For Goal Achievement: 06/09/19 Potential to Achieve Goals: Good ADL Goals Pt Will Perform Grooming: with supervision;standing Pt Will Perform Lower Body Bathing: with min guard assist;with caregiver independent in assisting;with adaptive equipment;sit to/from stand Pt Will Perform Lower Body Dressing: with min assist;with caregiver independent in assisting;with adaptive equipment;sit to/from stand Pt Will Transfer to Toilet: with modified independence;ambulating Pt Will Perform Toileting - Clothing Manipulation and hygiene: with modified independence;sit to/from stand Additional ADL Goal #1:  Pt will perform bed mobility at mod I level prior to engaging in ADL  OT Frequency: Min 2X/week   Barriers to D/C:            Co-evaluation PT/OT/SLP Co-Evaluation/Treatment: Yes Reason for Co-Treatment: For patient/therapist safety;To address functional/ADL transfers PT goals addressed during session: Mobility/safety with mobility;Balance;Proper use of DME OT goals addressed during session: ADL's and self-care;Proper use of Adaptive equipment and DME      AM-PAC OT "6 Clicks" Daily Activity     Outcome Measure Help from another person eating meals?: Total(NPO) Help from another person taking care of personal grooming?: A Little Help from another person toileting, which includes using toliet, bedpan, or urinal?: A Little Help from another person bathing (including washing, rinsing, drying)?: A Little Help from another person to put on and taking off regular upper body clothing?: A Little Help from another person to put on and taking off regular lower body clothing?: A Lot 6 Click Score: 15   End of Session Equipment Utilized During Treatment: Rolling walker Nurse Communication: Mobility status  Activity Tolerance: Patient tolerated treatment well Patient left: in chair;with call bell/phone within reach;Other (comment)  OT Visit Diagnosis: Other abnormalities of gait and mobility (R26.89);Muscle weakness (generalized) (M62.81)                Time: [id number] OT Time Calculation (min): 23 min Charges:  OT General Charges $OT Visit: 1 Visit OT Evaluation $OT Eval Moderate Complexity: 1 Mod  Sherryl MangesLaura Kwynn Schlotter OTR/L Acute Rehabilitation Services Pager: [phone] Office:  [place] 05/26/2019, 11:09 AM

## 2019-05-26 NOTE — Consult Note (Addendum)
Cardiology Consultation:   Patient ID: [name] MRN: [id number]; DOB: Jan 04, 1956  Admit date: 05/25/2019 Date of Consult: 05/26/2019  Primary Care Provider: Antonietta Jewel, MD Primary Cardiologist: Larae Grooms, MD  Primary Electrophysiologist:  None    Patient Profile:   Tyler Burns is a 63 y.o. male with a history of permanent atrial fibrillation on Aspirin but no anticoagulation, mild LV dysfunction with EF of 45-50%, AAA followed by Dr. Donnetta Hutching (Vascular Surgery), hypertension, hyperlipidemia, renal insufficiency, chronic back pain, and glaucoma, who is being seen today for the evaluation of atrial fibrillation at the request of Dr. Donnetta Hutching.  History of Present Illness:   [name] is a 63 year old male with the above history who is followed by Dr. Irish Lack. Patient was initially diagnosed with atrial fibrillation in 2014. Echocardiogram at that time showed LVEF of 45-50% with diffuse hypokinesis. Given his young age, cardioversion was discussed but patient did not want to pursue that option at that time. He was started on Toprol and Aspirin 325mg . He had a nuclear stress test in 2016 as part of pre-operative evaluation for a hernia repair. Study showed a small area of moderate inferoseptal ischemia but was considered low risk. Patient denied any angina at that time so no further ischemic evaluation was performed. Patient last seen by Dr. Irish Lack in 09/2018 at which time he was doing well from a cardiac standpoint. Aspirin was reduced from 325mg  to 81mg  daily. Repeat nuclear stress test was ordered as part of pre-operative evaluation for anticipated repair of AAA. Study was low risk with no evidence of ischemia noted. EF was 50%. Routine CTA on 04/12/2019 showed an enlargement of infrarenal abdominal aortic aneurysm from 4.9cm in 01/2018 to 5.5cm. He was seen by Dr. Donnetta Hutching on 04/19/2019 and elective repair was recommended due to the size of his aneurysm and more rapid growth.   Patient  presented to [facility] on 05/25/2019 for scheduled open repair of AAA. Surgery went well with no early complications. Cardiology was consulted today for assistance in management of atrial fibrillation.   Patient has been doing well from a cardiac standpoint. He denies any cardiac symptoms including chest pain, shortness of breath, palpitations, lightheadedness, dizziness, syncope, orthopnea, PND, or edema. He has chronic back pain from a work injury over 10 years ago that limits his activity but has otherwise been doing well.  At the time of this evaluation, he reports some abdominal soreness but states otherwise he feels fine.  He has a remote history of tobacco use but quit about 40 years ago. He reports occasional alcohol use but denies any recreational drug use. He goes to a pain clinic for his back pain. He does have a family history of heart disease with his mother having a heart attack. Both of his sisters also have heart disease but he is unsure exactly what they have.  Heart Pathway Score:     Past Medical History:  Diagnosis Date   Asthma    Dysrhythmia    Afib   Glaucoma    High cholesterol    History of cardiovascular stress test    Myoview 12/19:  EF 50%, No ischemia, Low Risk   Hypertension     Past Surgical History:  Procedure Laterality Date   ABDOMINAL AORTIC ANEURYSM REPAIR N/A 05/25/2019   Procedure: ANEURYSM ABDOMINAL AORTIC REPAIR using a HEMASHIELD GOLD VASCULAR GRAFT;  Surgeon: [name], MD;  Location: [place];  Service: Vascular;  Laterality: N/A;   APPENDECTOMY  EYE SURGERY Right    cataract   INGUINAL HERNIA REPAIR Right 06/15/2015   Procedure: RIGHT INGUINAL HERNIA REPAIR WITH MESH;  Surgeon: Avel Peace, MD;  Location: [facility] OR;  Service: General;  Laterality: Right;   INSERTION OF MESH Right 06/15/2015   Procedure: INSERTION OF MESH;  Surgeon: Avel Peace, MD;  Location: [facility] OR;  Service: General;  Laterality: Right;     Home  Medications:  Prior to Admission medications   Medication Sig Start Date End Date Taking? Authorizing Provider  aspirin EC 81 MG tablet Take 1 tablet (81 mg total) by mouth daily. 09/30/18  Yes Corky Crafts, MD  bimatoprost (LUMIGAN) 0.01 % SOLN Place 1 drop into the left eye at bedtime.    Yes [provider]  brimonidine (ALPHAGAN P) 0.1 % SOLN Place 1 drop into both eyes 2 (two) times a day.   Yes [provider]  brinzolamide (AZOPT) 1 % ophthalmic suspension Place 1 drop into both eyes 2 (two) times a day.   Yes [provider]  hydrALAZINE (APRESOLINE) 50 MG tablet Take 50 mg by mouth 2 (two) times daily.   Yes [provider]  HYDROcodone-acetaminophen (NORCO/VICODIN) 5-325 MG tablet Take 1 tablet by mouth 3 (three) times daily.   Yes [provider]  lisinopril (PRINIVIL,ZESTRIL) 20 MG tablet Take 20 mg by mouth daily.  04/12/15  Yes [provider]  lovastatin (MEVACOR) 40 MG tablet Take 60 mg by mouth daily.  09/28/18  Yes [provider]  metoprolol succinate (TOPROL-XL) 50 MG 24 hr tablet Take 50 mg by mouth daily. Take with or immediately following a meal.   Yes [provider]  Omega-3 Fatty Acids (FISH OIL) 600 MG CAPS Take 600 mg by mouth daily.    Yes [provider]  Turmeric Curcumin 500 MG CAPS Take 500 mg by mouth daily.   Yes [provider]  vitamin E 400 UNIT capsule Take 400 Units by mouth daily.    Yes [provider]  cetirizine (ZYRTEC) 10 MG tablet Take 1 tablet (10 mg total) by mouth daily. One tab daily for allergies Patient not taking: Reported on 05/12/2019 08/07/12   Linna Hoff, MD  furosemide (LASIX) 40 MG tablet Take 1 tablet (40 mg total) by mouth daily. Patient not taking: Reported on 05/12/2019 10/17/13   Corky Crafts, MD  metoprolol succinate (TOPROL XL) 25 MG 24 hr tablet Take 1 tablet (25 mg total) by mouth daily. Patient not taking: Reported  on 05/12/2019 09/20/13   Corky Crafts, MD    Inpatient Medications: Scheduled Meds:  brimonidine  1 drop Both Eyes BID   brinzolamide  1 drop Both Eyes BID   Chlorhexidine Gluconate Cloth  6 each Topical Daily   [START ON 05/28/2019] docusate sodium  100 mg Oral Daily   latanoprost  1 drop Left Eye QHS   metoprolol tartrate  2.5 mg Intravenous Q6H   pantoprazole (PROTONIX) IV  40 mg Intravenous QHS   Continuous Infusions:  sodium chloride     dextrose 5 % and 0.45% NaCl 125 mL/hr at 05/26/19 1127   magnesium sulfate bolus IVPB     PRN Meds: sodium chloride, acetaminophen **OR** acetaminophen, bisacodyl, HYDROmorphone (DILAUDID) injection, magnesium sulfate bolus IVPB, metoprolol tartrate, morphine injection, ondansetron, phenol  Allergies:    Allergies  Allergen Reactions   Brinzolamide-Brimonidine Shortness Of Breath    Feels he has difficulty breathing when on medication Eye drop medication  Social History:   Social History   Socioeconomic History   Marital status: Divorced    Spouse name: Not on file   Number of children: Not on file   Years of education: Not on file   Highest education level: Not on file  Occupational History   Not on file  Social Needs   Financial resource strain: Not on file   Food insecurity    Worry: Not on file    Inability: Not on file   Transportation needs    Medical: Not on file    Non-medical: Not on file  Tobacco Use   Smoking status: Former Smoker    Quit date: 1970    Years since quitting: 50.6   Smokeless tobacco: Never Used  Substance and Sexual Activity   Alcohol use: No   Drug use: No   Sexual activity: Not on file  Lifestyle   Physical activity    Days per week: Not on file    Minutes per session: Not on file   Stress: Not on file  Relationships   Social connections    Talks on phone: Not on file    Gets together: Not on file    Attends religious service: Not on file    Active  member of club or organization: Not on file    Attends meetings of clubs or organizations: Not on file    Relationship status: Not on file   Intimate partner violence    Fear of current or ex partner: Not on file    Emotionally abused: Not on file    Physically abused: Not on file    Forced sexual activity: Not on file  Other Topics Concern   Not on file  Social History Narrative   Not on file    Family History:    Family History  Problem Relation Age of Onset   Congestive Heart Failure Mother    Heart attack Mother [id number]   Heart attack Brother 4845   Leukemia Brother    Heart failure Sister [phone]   Stroke Neg Hx      ROS:  Please see the history of present illness.  Review of Systems  Constitutional: Negative for chills and fever.  HENT: Negative for congestion.   Respiratory: Negative for cough, hemoptysis and shortness of breath.   Cardiovascular: Negative for chest pain, palpitations, orthopnea, leg swelling and PND.  Gastrointestinal: Positive for abdominal pain. Negative for blood in stool.  Genitourinary: Negative for hematuria.  Musculoskeletal: Positive for back pain. Negative for myalgias.  Neurological: Negative for dizziness and loss of consciousness.  Endo/Heme/Allergies: Does not bruise/bleed easily.  Psychiatric/Behavioral: Negative for substance abuse.    Physical Exam/Data:   Vitals:   05/26/19 0900 05/26/19 1000 05/26/19 1100 05/26/19 1108  BP: 128/71 124/87 129/90   Pulse: 85 91 89   Resp: (!) 21 (!) 25 18   Temp: 98.2 F (36.8 C)   98.3 F (36.8 C)  TempSrc:    Oral  SpO2: 96% 96% 94%   Weight:        Intake/Output Summary (Last 24 hours) at 05/26/2019 1215 Last data filed at 05/26/2019 1127 Gross per 24 hour  Intake 2254.78 ml  Output 2450 ml  Net -195.22 ml   Last 3 Weights 05/26/2019 05/18/2019 04/19/2019  Weight (lbs) 245 lb 6 oz 244 lb 9.6 oz 243 lb  Weight (kg) 111.3 kg 110.95 kg 110.224 kg     Body mass index is 33.28 kg/m.  General:  Obese Caucasian male resting comfortably in no acute distress. HEENT: Normal. Neck: Supple.  Vascular: Radial pulses 2+ and equal bilaterally. Cardiac: Irregularly irregular rhythm with normal rate. No murmurs, gallops, or rubs. Lungs: No increased work of breathing. Clear to ausculation bilaterally. No wheezes, rhonchi, or rales.  Abd: Soft, non-distended, and appropriately tender following open AAA repair. Absent bowel sounds. Incision clear, dry, and non-erythematous. NG tube in place. Ext: No lower extremity edema. Musculoskeletal:  No deformities. BUE and BLE strength normal and equal. Skin: Warm and dry. Neuro:  Alert and oriented. No focal deficits. Psych:  Normal affect. Responds appropriately.  EKG:  The EKG was personally reviewed and demonstrates: Atrial fibrillation, rate 87 bpm, with no acute ST/T changes. No significant changes compared to prior tracings.   Telemetry:  Telemetry was personally reviewed and demonstrates:  Atrial fibrillation with rates in the 80's to 90's and some PVCs.  Relevant CV Studies:  Lexiscan Myoview 10/13/2018:  Nuclear stress EF: 50%. No wall motion abnormality  There was no ST segment deviation noted during stress. No perfusion defects.  This is a low risk study. No ischemia identified. _______________  Echocardiogram 10/10/2013: Study Conclusions - Left ventricle: The cavity size was normal. There was mild  focal basal hypertrophy of the septum. Systolic function  was mildly reduced. The estimated ejection fraction was  50%, in the range of 45% to 50%. Diffuse hypokinesis.  - Aortic valve: Trivial regurgitation.  - Mitral valve: Mild regurgitation.  - Left atrium: The atrium was mildly dilated.  - Right ventricle: The cavity size was mildly dilated. Wall  thickness was normal.  - Right atrium: The atrium was mildly dilated.   Laboratory Data:  High Sensitivity Troponin:  No results for input(s): TROPONINIHS in  the last 720 hours.   Cardiac EnzymesNo results for input(s): TROPONINI in the last 168 hours. No results for input(s): TROPIPOC in the last 168 hours.  Chemistry Recent Labs  Lab 05/25/19 1154 05/25/19 1300 05/26/19 0447  NA 135 137 134*  K 5.7* 4.6 4.2  CL  --  105 104  CO2  --  20* 23  GLUCOSE  --  195* 171*  BUN  --  21 17  CREATININE  --  1.41* 1.11  CALCIUM  --  8.4* 8.4*  GFRNONAA  --  52* >60  GFRAA  --  >60 >60  ANIONGAP  --  12 7    Recent Labs  Lab 05/26/19 0447  PROT 6.1*  ALBUMIN 3.8  AST 24  ALT 19  ALKPHOS 43  BILITOT 1.2   Hematology Recent Labs  Lab 05/25/19 1154 05/25/19 1300 05/26/19 0447  WBC  --  16.6* 15.2*  RBC  --  4.71 4.49  HGB 15.0 15.2 14.3  HCT 44.0 45.6 42.6  MCV  --  96.8 94.9  MCH  --  32.3 31.8  MCHC  --  33.3 33.6  RDW  --  13.3 13.5  PLT  --  195 182   BNPNo results for input(s): BNP, PROBNP in the last 168 hours.  DDimer No results for input(s): DDIMER in the last 168 hours.   Radiology/Studies:  Dg Chest Port 1 View  Result Date: 05/26/2019 CLINICAL DATA:  Abdominal aortic aneurysm repair. EXAM: PORTABLE CHEST 1 VIEW COMPARISON:  Yesterday FINDINGS: Swan-Ganz catheter with tip in the region of the pulmonary outflow tract. Enteric tube at least crosses the diaphragm. There is no edema, consolidation, effusion, or pneumothorax. Normal heart size and  mediastinal contours. IMPRESSION: Improved vascular congestion. No evidence of acute cardiopulmonary disease. Electronically Signed   By: Marnee Spring M.D.   On: 05/26/2019 08:16   Dg Chest Port 1 View  Result Date: 05/25/2019 CLINICAL DATA:  S/p aaa repair EXAM: PORTABLE CHEST 1 VIEW COMPARISON:  None. FINDINGS: Right-sided pulmonary arterial catheter seen with the tip in the left main pulmonary artery. NG tube is seen coursing below the diaphragm. There is mild prominence to the pulmonary vasculature throughout. Mild cardiomegaly. No pneumothorax. IMPRESSION: Pulmonary  vascular congestion Mild cardiomegaly Lines and tubes in satisfactory position Electronically Signed   By: Jonna Clark M.D.   On: 05/25/2019 15:13   Dg Abd Portable 1v  Result Date: 05/25/2019 CLINICAL DATA:  Status post abdominal aortic aneurysm repair EXAM: PORTABLE ABDOMEN - 1 VIEW COMPARISON:  April 12, 2019 CT FINDINGS: The bowel gas pattern is normal. Rounded calcification seen in the right lower pelvis. No acute osseous abnormality. Tip of the NG tube is seen projecting into the stomach. IMPRESSION: Nonobstructive bowel gas pattern. Electronically Signed   By: Jonna Clark M.D.   On: 05/25/2019 15:14    Assessment and Plan:   Permanent Atrial Fibrillation - Patient has permanent atrial fibrillation initially diagnosed in 2014. There was discussion of DCCV in the past but patient declined. - Rates controlled in the 80's to 90's.  - Patient on Toprol 50mg  daily at home. Currently, on Lopressor 2.5mg  every 6 hours while NPO. Restart home meds when able. - CHA2DS2-VASC = at least 2 (HTN and age; history LV dysfunction and mural thrombus). Patient only on Aspirin 81mg  daily at home. However, per last office visit note with Dr. Eldridge Dace "once he turns 38, would have to reconsider whether he needs anticoagulation for atrial fibrillation." Patient has since turned 63. Can follow-up with Dr. Eldridge Dace as outpatient to discuss this.  AAA - Patient s/p open repair (resection grafting with aorto to bilateral common iliac artery bypass) on 05/25/2019. - Being managed by Vascular Surgery.  History of Mild LV Dysfunction - Echo in 2014 showed mildly reduced EF of 45-50% and diffuse hypokinesis after being diagnosed with atrial fibrillation. EF on recent nuclear stress test in 09/2018 was 50%.  - Patient euvolemic. No heart failure symptoms. - Continue to monitor volume status closely.  Hypertension - BP currently well controlled.  - Continue beta-blocker as above.  Hyperlipidemia - Restart home  Lovastatin when able to take PO.  For questions or updates, please contact [facility] Please consult [email or web address] for contact info under     Signed, Corrin Parker, PA-C  05/26/2019 12:15 PM  I have personally seen and examined this patient with Marjie Skiff PA-C. I agree with the assessment and plan as outlined above. He is followed in our office by Dr. Eldridge Dace for chronic atrial fibrillation. CHADS VASC score 1 so on ASA only. Admitted to [facility] following aorto bi-iliac bypass in treatment of AAA. He is now in rate controlled atrial fib with a beta blocker.  He has no complaints today.  Cardiology consulted by the surgical service for assistance with managing atrial fibrillation  Labs reviewed by me.  EKG from yesterday personally reviewed by me. Atrial fib with rate of 87 bpm   My exam:  General: Well developed, well nourished, NAD  HEENT: OP clear, mucus membranes moist  SKIN: warm, dry. No rashes.  Neuro: No focal deficits  Musculoskeletal: Muscle strength 5/5 all ext  Psychiatric: Mood and affect normal  Neck: No JVD,  no carotid bruits, no thyromegaly, no lymphadenopathy.  Lungs:Clear bilaterally, no wheezes, rhonci, crackles  Cardiovascular: Irreg irreg. No murmurs, gallops or rubs.  Abdomen:Soft. Bowel sounds present.  Extremities: No lower extremity edema. Pulses are 2 + in the bilateral DP/PT.   Plan: Atrial fibrillation, chronic: Rate controlled. He has not been on long term anti-coagulation per Dr. [name]. Continue ASA and beta blocker. Change to oral beta blocker when taking po.  We will follow with you.   Verne CarrowChristopher Jordon Kristiansen  05/26/2019 1:43 PM

## 2019-05-26 NOTE — Evaluation (Signed)
Physical Therapy Evaluation Patient Details Name: Tyler Burns MRN: [id number] DOB: December 24, 1955 Today's Date: 05/26/2019   History of Present Illness  Pt is a 63 y/o male s/p Aneurysm Abdominal Aortic Repair. Pt has a PMH including Asthma, Dysrhythmia, Glaucoma, HTN.  Clinical Impression  Pt admitted with above diagnosis. Pt currently with functional limitations due to the deficits listed below (see PT Problem List). Pt was able to transfer to chair with min guard assist. Decided not to ambulate as pt c/o dizziness when sitting.  Will follow acutely.   Pt will benefit from skilled PT to increase their independence and safety with mobility to allow discharge to the venue listed below.    Follow Up Recommendations No PT follow up;Supervision - Intermittent    Equipment Recommendations  Rolling walker with 5" wheels(possibly needs new RW)    Recommendations for Other Services       Precautions / Restrictions Precautions Precaution Comments: NG Tube Restrictions Weight Bearing Restrictions: No      Mobility  Bed Mobility Overal bed mobility: Needs Assistance Bed Mobility: Supine to Sit     Supine to sit: Min assist;HOB elevated     General bed mobility comments: Pt educated on log roll technique, but performed supine to sit due to proximity to edge with assist for trunk elevation from therapist  Transfers Overall transfer level: Needs assistance Equipment used: Rolling walker (2 wheeled) Transfers: Sit to/from [facility] Sit to Stand: Min guard;+2 safety/equipment Stand pivot transfers: Min guard;+2 safety/equipment       General transfer comment: +2 for line management, vc for safe hand placement.  Able to take pivotal steps to recliner from bed. c/o dizziness wtih standing.  BP stable.   Ambulation/Gait                Stairs            Wheelchair Mobility    Modified Rankin (Stroke Patients Only)       Balance Overall balance  assessment: Mild deficits observed, not formally tested                                           Pertinent Vitals/Pain Pain Assessment: 0-10 Pain Score: 9  Pain Location: abdomen Pain Descriptors / Indicators: Constant;Discomfort;Sore Pain Intervention(s): Limited activity within patient's tolerance;Monitored during session;Repositioned    Home Living Family/patient expects to be discharged to:: Private residence Living Arrangements: Spouse/significant other(live in girlfriend is a Quarry manager) Available Help at Discharge: Friend(s);Available 24 hours/day Type of Home: House Home Access: Ramped entrance     Home Layout: One level Home Equipment: Walker - 2 wheels;Bedside commode;Shower seat;Hand held shower head;Hospital bed      Prior Function Level of Independence: Independent               Hand Dominance   Dominant Hand: Right    Extremity/Trunk Assessment   Upper Extremity Assessment Upper Extremity Assessment: Defer to OT evaluation    Lower Extremity Assessment Lower Extremity Assessment: Generalized weakness    Cervical / Trunk Assessment Cervical / Trunk Assessment: Other exceptions Cervical / Trunk Exceptions: s/p abdominal incision  Communication   Communication: No difficulties  Cognition Arousal/Alertness: Awake/alert Behavior During Therapy: WFL for tasks assessed/performed Overall Cognitive Status: Within Functional Limits for tasks assessed  General Comments General comments (skin integrity, edema, etc.): VSS    Exercises     Assessment/Plan    PT Assessment Patient needs continued PT services  PT Problem List Decreased activity tolerance;Decreased mobility;Decreased balance;Decreased knowledge of use of DME;Decreased safety awareness;Decreased knowledge of precautions;Cardiopulmonary status limiting activity;Pain       PT Treatment Interventions DME instruction;Gait  training;Functional mobility training;Therapeutic activities;Therapeutic exercise;Balance training;Patient/family education    PT Goals (Current goals can be found in the Care Plan section)  Acute Rehab PT Goals Patient Stated Goal: "Get all these tubes out" and get back to independent PT Goal Formulation: With patient Time For Goal Achievement: 06/09/19 Potential to Achieve Goals: Good    Frequency Min 3X/week   Barriers to discharge        Co-evaluation PT/OT/SLP Co-Evaluation/Treatment: Yes Reason for Co-Treatment: For patient/therapist safety PT goals addressed during session: Mobility/safety with mobility OT goals addressed during session: ADL's and self-care;Proper use of Adaptive equipment and DME       AM-PAC PT "6 Clicks" Mobility  Outcome Measure Help needed turning from your back to your side while in a flat bed without using bedrails?: A Little Help needed moving from lying on your back to sitting on the side of a flat bed without using bedrails?: A Little Help needed moving to and from a bed to a chair (including a wheelchair)?: A Little Help needed standing up from a chair using your arms (e.g., wheelchair or bedside chair)?: A Little Help needed to walk in hospital room?: A Little Help needed climbing 3-5 steps with a railing? : A Little 6 Click Score: 18    End of Session Equipment Utilized During Treatment: Gait belt Activity Tolerance: Patient limited by fatigue(c/o dizziness with standing. ) Patient left: in chair;with call bell/phone within reach Nurse Communication: Mobility status PT Visit Diagnosis: Muscle weakness (generalized) (M62.81)    Time: [id number] PT Time Calculation (min) (ACUTE ONLY): 13 min   Charges:   PT Evaluation $PT Eval Moderate Complexity: 1 Mod          Tyler Burns,PT Acute Rehabilitation Services Pager:  [phone]  Office:  [phone]    Berline LopesDawn F Reesa Gotschall 05/26/2019, 1:40 PM

## 2019-05-26 NOTE — Progress Notes (Addendum)
  AAA Progress Note    05/26/2019 11:16 AM 1 Day Post-Op  Subjective:  Sitting in chair; looks good; denies passing gas but burping a lot  Afebrile HR 80's-90's afib 716'R-678'L systolic 38% [id number]  Gtts:  none  Vitals:   05/26/19 0700 05/26/19 1108  BP: 125/90   Pulse: 89   Resp: 12   Temp: 98.1 F (36.7 C) 98.3 F (36.8 C)  SpO2: 96%     Physical Exam: Cardiac:  irregular Lungs:  Non labored on 3LO2NC Abdomen:  Soft, obese; -flatus Incisions:  Laparotomy incision is clean and dry Extremities:  Easily palpable DP pulses Neuro:  Intact  CBC    Component Value Date/Time   WBC 15.2 (H) 05/26/2019 0447   RBC 4.49 05/26/2019 0447   HGB 14.3 05/26/2019 0447   HCT 42.6 05/26/2019 0447   PLT 182 05/26/2019 0447   MCV 94.9 05/26/2019 0447   MCH 31.8 05/26/2019 0447   MCHC 33.6 05/26/2019 0447   RDW 13.5 05/26/2019 0447   LYMPHSABS 2.0 06/06/2015 1020   MONOABS 0.3 06/06/2015 1020   EOSABS 0.2 06/06/2015 1020   BASOSABS 0.0 06/06/2015 1020    BMET    Component Value Date/Time   NA 134 (L) 05/26/2019 0447   NA 140 07/30/2017 1212   K 4.2 05/26/2019 0447   CL 104 05/26/2019 0447   CO2 23 05/26/2019 0447   GLUCOSE 171 (H) 05/26/2019 0447   BUN 17 05/26/2019 0447   BUN 18 07/30/2017 1212   CREATININE 1.11 05/26/2019 0447   CALCIUM 8.4 (L) 05/26/2019 0447   GFRNONAA >60 05/26/2019 0447   GFRAA >60 05/26/2019 0447    INR    Component Value Date/Time   INR 1.2 05/25/2019 1438     Intake/Output Summary (Last 24 hours) at 05/26/2019 1116 Last data filed at 05/26/2019 0700 Gross per 24 hour  Intake 3034.78 ml  Output 2090 ml  Net 944.78 ml     Assessment/Plan:  63 y.o. male is s/p  Resection grafting with non-aortic aneurysm repair with 16 x 8 Hemashield aorto to bilateral common iliac artery bypass 1 Day Post-Op  -Cardiac:  Rate control afib.  Pt with hx of afib followed by Dr. Irish Lack.  Will consult cards to assist.  Pt on Lopressor 2.5mg  IV q6h  scheduled.  Will hold on heparin until at least tomorrow due to extensive dissection during surgery.  -Pulmonary:  Non labored on 3LO2NC.  Continue to work on IS and mobilize -Neuro:  In tact -Vascular;  Easily palpable DP pulses bilaterally -GI:  Abdomen is soft, NT.  -flatus & -BM.  NGT with moderate output.  Will leave NGT today and remove tomorrow.  Continue npo for now until bowel function returns.  Continue IVF until taking po's.  IV protonix. -Heme/ID:  Hgb stable - no blood transfusion thus far.  Pt remains afebrile and incision looks good. -Renal:  Renal function normal post op.  Pt with good UOP.  Will dc foley today. -General:  oob to chair and looks good.  Will transfer to [place] with PT/OT.   Leontine Locket, PA-C [facility] [phone] 05/26/2019 11:16 AM   I have examined the patient, reviewed and agree with above.  Very comfortable.  Up in chair.  Will transfer to floor East.  Moderate NG output.  Will plan on DC in a.m.  Curt Jews, MD 05/26/2019 1:24 PM

## 2019-05-27 LAB — TYPE AND SCREEN
ABO/RH(D): AB POS
Antibody Screen: NEGATIVE
Unit division: 0
Unit division: 0

## 2019-05-27 LAB — BPAM RBC
Blood Product Expiration Date: 202008182359
Blood Product Expiration Date: 202008212359
ISSUE DATE / TIME: 202007290729
ISSUE DATE / TIME: 202007290729
Unit Type and Rh: 8400
Unit Type and Rh: 8400

## 2019-05-27 MED ORDER — BISACODYL 10 MG RE SUPP
10.0000 mg | Freq: Once | RECTAL | Status: AC
  Administered 2019-05-27: 10 mg via RECTAL
  Filled 2019-05-27: qty 1

## 2019-05-27 MED FILL — Sodium Chloride Irrigation Soln 0.9%: Qty: 3000 | Status: AC

## 2019-05-27 MED FILL — Sodium Chloride IV Soln 0.9%: INTRAVENOUS | Qty: 1000 | Status: AC

## 2019-05-27 MED FILL — Heparin Sodium (Porcine) Inj 1000 Unit/ML: INTRAMUSCULAR | Qty: 30 | Status: AC

## 2019-05-27 NOTE — Progress Notes (Addendum)
Occupational Therapy Treatment Patient Details Name: Tyler Burns MRN: [id number] DOB: September 27, 1956 Today's Date: 05/27/2019    History of present illness Pt is a 63 y/o male s/p Aneurysm Abdominal Aortic Repair. Pt has a PMH including Asthma, Dysrhythmia, Glaucoma, HTN.   OT comments  Pt progressing towards OT goals this session. Recent mobility with PT, short in room mobility for sink level grooming, and focused session on education with AE/compensatory strategies for LB ADL - reviewed kit and provided demonstration. Pt returned supine at the end of session with min A for assist for BLE utilizing log roll technique for pain management. Pt eager to get NG tube out.    Follow Up Recommendations  Supervision/Assistance - 24 hour;No OT follow up    Equipment Recommendations  None recommended by OT    Recommendations for Other Services      Precautions / Restrictions Precautions Precaution Comments: NG Tube Restrictions Weight Bearing Restrictions: No       Mobility Bed Mobility Overal bed mobility: Needs Assistance Bed Mobility: Sit to Supine     Supine to sit: Min assist;HOB elevated Sit to supine: Min assist   General bed mobility comments: Min A for BLE back into bed  Transfers Overall transfer level: Needs assistance Equipment used: Rolling walker (2 wheeled) Transfers: Sit to/from Stand Sit to Stand: Min guard Stand pivot transfers: Min guard;+2 safety/equipment       General transfer comment: min guard for safety    Balance Overall balance assessment: Mild deficits observed, not formally tested                                         ADL either performed or assessed with clinical judgement   ADL Overall ADL's : Needs assistance/impaired     Grooming: Min guard;Standing;Wash/dry hands Grooming Details (indicate cue type and reason): sink level     Lower Body Bathing: Minimal assistance;Sitting/lateral leans;With adaptive  equipment Lower Body Bathing Details (indicate cue type and reason): educated in AE for LB bathing     Lower Body Dressing: Moderate assistance;With adaptive equipment;Sit to/from stand Lower Body Dressing Details (indicate cue type and reason): educated in AE options for LB dressing (Pt states that he is planning on getting assist from his girlfriend who is a Chief Executive Officer: Ambulation;Min guard           Functional mobility during ADLs: Therapist, art      Cognition Arousal/Alertness: Awake/alert Behavior During Therapy: WFL for tasks assessed/performed Overall Cognitive Status: Within Functional Limits for tasks assessed                                          Exercises     Shoulder Instructions       General Comments      Pertinent Vitals/ Pain       Pain Assessment: 0-10 Pain Score: 9  Pain Location: abdomen Pain Descriptors / Indicators: Constant;Discomfort;Sore Pain Intervention(s): Monitored during session;Repositioned  Home Living  Prior Functioning/Environment              Frequency  Min 2X/week        Progress Toward Goals  OT Goals(current goals can now be found in the care plan section)  Progress towards OT goals: Progressing toward goals  Acute Rehab OT Goals Patient Stated Goal: "get this tube out of my nose" OT Goal Formulation: With patient Time For Goal Achievement: 06/09/19 Potential to Achieve Goals: Good  Plan Discharge plan remains appropriate;Frequency remains appropriate    Co-evaluation                 AM-PAC OT "6 Clicks" Daily Activity     Outcome Measure   Help from another person eating meals?: Total(NPO) Help from another person taking care of personal grooming?: A Little Help from another person toileting, which includes using toliet, bedpan, or urinal?: A Little Help  from another person bathing (including washing, rinsing, drying)?: A Little Help from another person to put on and taking off regular upper body clothing?: A Little Help from another person to put on and taking off regular lower body clothing?: A Lot 6 Click Score: 15    End of Session Equipment Utilized During Treatment: Rolling walker  OT Visit Diagnosis: Other abnormalities of gait and mobility (R26.89);Muscle weakness (generalized) (M62.81)   Activity Tolerance Patient tolerated treatment well   Patient Left in bed;with call bell/phone within reach;with nursing/sitter in room   Nurse Communication Mobility status        Time: 1450-1505 OT Time Calculation (min): 15 min  Charges: OT General Charges $OT Visit: 1 Visit OT Treatments $Self Care/Home Management : 8-22 mins  Hulda Humphrey OTR/L Acute Rehabilitation Services Pager: [phone] Office: [place] 05/27/2019, 5:05 PM

## 2019-05-27 NOTE — Progress Notes (Signed)
Progress Note  Patient Name: JD MCCASTER Date of Encounter: 05/27/2019  Primary Cardiologist: Larae Grooms, MD   Subjective   Pt having abdominal pain this am. HR in the 120s with severe pain. No chest pain or dyspnea.   Inpatient Medications    Scheduled Meds: . brimonidine  1 drop Both Eyes BID  . brinzolamide  1 drop Both Eyes BID  . Chlorhexidine Gluconate Cloth  6 each Topical Daily  . [START ON 05/28/2019] docusate sodium  100 mg Oral Daily  . latanoprost  1 drop Left Eye QHS  . metoprolol tartrate  2.5 mg Intravenous Q6H  . pantoprazole (PROTONIX) IV  40 mg Intravenous QHS   Continuous Infusions: . sodium chloride    . dextrose 5 % and 0.45% NaCl 125 mL/hr at 05/27/19 0000  . magnesium sulfate bolus IVPB     PRN Meds: sodium chloride, acetaminophen **OR** acetaminophen, bisacodyl, HYDROmorphone (DILAUDID) injection, magnesium sulfate bolus IVPB, metoprolol tartrate, morphine injection, ondansetron, phenol   Vital Signs    Vitals:   05/27/19 0000 05/27/19 0345 05/27/19 0600 05/27/19 0717  BP: 112/78  131/88   Pulse: (!) 109  (!) 101   Resp:      Temp: 97.8 F (36.6 C) 98.5 F (36.9 C) 98.5 F (36.9 C) 98 F (36.7 C)  TempSrc: Core Oral Oral Oral  SpO2: 94%  93%   Weight:      Height:        Intake/Output Summary (Last 24 hours) at 05/27/2019 0817 Last data filed at 05/27/2019 0717 Gross per 24 hour  Intake 1699.35 ml  Output 1035 ml  Net 664.35 ml   Last 3 Weights 05/26/2019 05/26/2019 05/18/2019  Weight (lbs) 245 lb 6 oz 245 lb 6 oz 244 lb 9.6 oz  Weight (kg) 111.3 kg 111.3 kg 110.95 kg      Telemetry    Atrial fib, rate 120s - Personally Reviewed  ECG    No AM EKG   Physical Exam   General: Well developed, well nourished, NAD HEENT: OP clear, mucus membranes moist  SKIN: warm, dry. No rashes. Neuro: No focal deficits  Musculoskeletal: Muscle strength 5/5 all ext  Psychiatric: Mood and affect normal  Neck: No JVD, no carotid  bruits, no thyromegaly, no lymphadenopathy.  Lungs:Clear bilaterally, no wheezes, rhonci, crackles Cardiovascular: Irreg irreg. No murmurs, gallops or rubs. Abdomen: TTP Extremities: No lower extremity edema.   Labs    High Sensitivity Troponin:  No results for input(s): TROPONINIHS in the last 720 hours.    Cardiac EnzymesNo results for input(s): TROPONINI in the last 168 hours. No results for input(s): TROPIPOC in the last 168 hours.   Chemistry Recent Labs  Lab 05/25/19 1154 05/25/19 1300 05/26/19 0447  NA 135 137 134*  K 5.7* 4.6 4.2  CL  --  105 104  CO2  --  20* 23  GLUCOSE  --  195* 171*  BUN  --  21 17  CREATININE  --  1.41* 1.11  CALCIUM  --  8.4* 8.4*  PROT  --   --  6.1*  ALBUMIN  --   --  3.8  AST  --   --  24  ALT  --   --  19  ALKPHOS  --   --  43  BILITOT  --   --  1.2  GFRNONAA  --  52* >60  GFRAA  --  >60 >60  ANIONGAP  --  12 7  Hematology Recent Labs  Lab 05/25/19 1154 05/25/19 1300 05/26/19 0447  WBC  --  16.6* 15.2*  RBC  --  4.71 4.49  HGB 15.0 15.2 14.3  HCT 44.0 45.6 42.6  MCV  --  96.8 94.9  MCH  --  32.3 31.8  MCHC  --  33.3 33.6  RDW  --  13.3 13.5  PLT  --  195 182    BNPNo results for input(s): BNP, PROBNP in the last 168 hours.   DDimer No results for input(s): DDIMER in the last 168 hours.   Radiology    Dg Chest Port 1 View  Result Date: 05/26/2019 CLINICAL DATA:  Abdominal aortic aneurysm repair. EXAM: PORTABLE CHEST 1 VIEW COMPARISON:  Yesterday FINDINGS: Swan-Ganz catheter with tip in the region of the pulmonary outflow tract. Enteric tube at least crosses the diaphragm. There is no edema, consolidation, effusion, or pneumothorax. Normal heart size and mediastinal contours. IMPRESSION: Improved vascular congestion. No evidence of acute cardiopulmonary disease. Electronically Signed   By: Marnee SpringJonathon  Watts M.D.   On: 05/26/2019 08:16   Dg Chest Port 1 View  Result Date: 05/25/2019 CLINICAL DATA:  S/p aaa repair EXAM:  PORTABLE CHEST 1 VIEW COMPARISON:  None. FINDINGS: Right-sided pulmonary arterial catheter seen with the tip in the left main pulmonary artery. NG tube is seen coursing below the diaphragm. There is mild prominence to the pulmonary vasculature throughout. Mild cardiomegaly. No pneumothorax. IMPRESSION: Pulmonary vascular congestion Mild cardiomegaly Lines and tubes in satisfactory position Electronically Signed   By: Jonna ClarkBindu  Avutu M.D.   On: 05/25/2019 15:13   Dg Abd Portable 1v  Result Date: 05/25/2019 CLINICAL DATA:  Status post abdominal aortic aneurysm repair EXAM: PORTABLE ABDOMEN - 1 VIEW COMPARISON:  April 12, 2019 CT FINDINGS: The bowel gas pattern is normal. Rounded calcification seen in the right lower pelvis. No acute osseous abnormality. Tip of the NG tube is seen projecting into the stomach. IMPRESSION: Nonobstructive bowel gas pattern. Electronically Signed   By: Jonna ClarkBindu  Avutu M.D.   On: 05/25/2019 15:14    Cardiac Studies     Patient Profile     63 y.o. male with chronic persistent atrial fib on no long term anti-coagulation admitted post aorto-biiliac bypass. Cardiology consulted to assist in managing atrial fibirllation  Assessment & Plan    1. Persistent atrial fibrillation: Heart rate elevated this am given his severe pain. Will continue metoprolol 2.5 mg IV Q6 hours. He is still NPO. I suspect his rate will come down as his pain is controlled today. Would continue current dose of metoprolol and use the additional metoprolol as needed. When he is taking po, can resume his home dose of Toprol 75 mg daily. He has not been anti-coagulated as an outpatient. This can be reviewed again at his follow up appt with Dr. [name].   For questions or updates, please contact [facility] Please consult [email or web address] for contact info under        Signed, Verne Carrowhristopher , MD  05/27/2019, 8:17 AM

## 2019-05-27 NOTE — Progress Notes (Signed)
Patient ID: DAMONTE FRIESON, male   DOB: 08-Sep-1956, 63 y.o.   MRN: [id number]  Progress Note    05/27/2019 1:36 PM 2 Days Post-Op  Subjective: Doing well.  No nausea.  Reports that he is hungry.  Less NG output.  No flatus   Vitals:   05/27/19 1249 05/27/19 1307  BP: 116/73   Pulse: (!) 121   Resp: 18 (!) 27  Temp: 97.7 F (36.5 C)   SpO2: 100%    Physical Exam: Abdomen soft.  Midline incision healing.  2+ dorsalis pedis pulses bilaterally  CBC    Component Value Date/Time   WBC 15.2 (H) 05/26/2019 0447   RBC 4.49 05/26/2019 0447   HGB 14.3 05/26/2019 0447   HCT 42.6 05/26/2019 0447   PLT 182 05/26/2019 0447   MCV 94.9 05/26/2019 0447   MCH 31.8 05/26/2019 0447   MCHC 33.6 05/26/2019 0447   RDW 13.5 05/26/2019 0447   LYMPHSABS 2.0 06/06/2015 1020   MONOABS 0.3 06/06/2015 1020   EOSABS 0.2 06/06/2015 1020   BASOSABS 0.0 06/06/2015 1020    BMET    Component Value Date/Time   NA 134 (L) 05/26/2019 0447   NA 140 07/30/2017 1212   K 4.2 05/26/2019 0447   CL 104 05/26/2019 0447   CO2 23 05/26/2019 0447   GLUCOSE 171 (H) 05/26/2019 0447   BUN 17 05/26/2019 0447   BUN 18 07/30/2017 1212   CREATININE 1.11 05/26/2019 0447   CALCIUM 8.4 (L) 05/26/2019 0447   GFRNONAA >60 05/26/2019 0447   GFRAA >60 05/26/2019 0447    INR    Component Value Date/Time   INR 1.2 05/25/2019 1438     Intake/Output Summary (Last 24 hours) at 05/27/2019 1336 Last data filed at 05/27/2019 1112 Gross per 24 hour  Intake 1699.35 ml  Output 800 ml  Net 899.35 ml     Assessment/Plan:  63 y.o. male stable postop day 2.  Will DC NG tube.  Continue to mobilize.     [name], MD FACS [facility] [phone] 05/27/2019 1:36 PM

## 2019-05-27 NOTE — Progress Notes (Signed)
Physical Therapy Treatment Patient Details Name: Tyler Burns MRN: [id number] DOB: 02/26/1956 Today's Date: 05/27/2019    History of Present Illness Pt is a 63 y/o male s/p Aneurysm Abdominal Aortic Repair. Pt has a PMH including Asthma, Dysrhythmia, Glaucoma, HTN.    PT Comments    Patient doing well with therapy, increasing ambulation distance from last PT visit, now supervision level with RW. HR 100-12040max this visit. Will cont to follow and progress. Encouraged pt to advocate for walks with floor staff 3x a day in between our sessions.      Follow Up Recommendations  No PT follow up;Supervision - Intermittent     Equipment Recommendations  Rolling walker with 5" wheels(possibly needs new RW)    Recommendations for Other Services       Precautions / Restrictions Precautions Precaution Comments: NG Tube Restrictions Weight Bearing Restrictions: No    Mobility  Bed Mobility Overal bed mobility: Needs Assistance Bed Mobility: Supine to Sit     Supine to sit: Min assist;HOB elevated     General bed mobility comments: Pt educated on log roll technique, but performed supine to sit due to proximity to edge with assist for trunk elevation from therapist  Transfers Overall transfer level: Needs assistance Equipment used: Rolling walker (2 wheeled) Transfers: Sit to/from [employer]GI CorporationStand;Stand Pivot Transfers Sit to Stand: Min guard;+2 safety/equipment Stand pivot transfers: Min guard;+2 safety/equipment       General transfer comment: +2 for line management, vc for safe hand placement.  Able to take pivotal steps to recliner from bed. c/o dizziness wtih standing.  BP stable.   Ambulation/Gait Ambulation/Gait assistance: Supervision Gait Distance (Feet): 150 Feet Assistive device: Rolling walker (2 wheeled) Gait Pattern/deviations: Step-to pattern;Step-through pattern Gait velocity: decreased   General Gait Details: slow painful gait but able to ambulate hallway  withoutexternal support. supervision for safety. HR 110-140 max   Stairs             Wheelchair Mobility    Modified Rankin (Stroke Patients Only)       Balance Overall balance assessment: Mild deficits observed, not formally tested                                          Cognition Arousal/Alertness: Awake/alert Behavior During Therapy: WFL for tasks assessed/performed Overall Cognitive Status: Within Functional Limits for tasks assessed                                        Exercises      General Comments        Pertinent Vitals/Pain Pain Location: abdomen Pain Descriptors / Indicators: Constant;Discomfort;Sore    Home Living                      Prior Function            PT Goals (current goals can now be found in the care plan section) Acute Rehab PT Goals Patient Stated Goal: "Get all these tubes out" and get back to independent PT Goal Formulation: With patient Time For Goal Achievement: 06/09/19 Potential to Achieve Goals: Good Progress towards PT goals: Progressing toward goals    Frequency    Min 3X/week      PT Plan      Co-evaluation PT/OT/SLP  Co-Evaluation/Treatment: Yes            AM-PAC PT "6 Clicks" Mobility   Outcome Measure  Help needed turning from your back to your side while in a flat bed without using bedrails?: A Little Help needed moving from lying on your back to sitting on the side of a flat bed without using bedrails?: A Little Help needed moving to and from a bed to a chair (including a wheelchair)?: A Little Help needed standing up from a chair using your arms (e.g., wheelchair or bedside chair)?: A Little Help needed to walk in hospital room?: A Little Help needed climbing 3-5 steps with a railing? : A Little 6 Click Score: 18    End of Session Equipment Utilized During Treatment: Gait belt Activity Tolerance: Patient limited by fatigue(c/o dizziness with  standing. ) Patient left: in chair;with call bell/phone within reach Nurse Communication: Mobility status PT Visit Diagnosis: Muscle weakness (generalized) (M62.81)     Time: 1400-1430 PT Time Calculation (min) (ACUTE ONLY): 30 min  Charges:  $Gait Training: 8-22 mins                     Reinaldo Berber, PT, DPT Acute Rehabilitation Services Pager: [phone] Office: [phone]     Reinaldo Berber 05/27/2019, 2:58 PM

## 2019-05-28 MED ORDER — OXYCODONE-ACETAMINOPHEN 5-325 MG PO TABS
1.0000 | ORAL_TABLET | ORAL | Status: DC | PRN
  Administered 2019-05-28: 19:00:00 2 via ORAL
  Administered 2019-05-29: 1 via ORAL
  Filled 2019-05-28 (×2): qty 2

## 2019-05-28 NOTE — Progress Notes (Addendum)
  AAA Progress Note    05/28/2019 9:17 AM 3 Days Post-Op  Subjective:  C/o soreness in abdomen but more so in testes  Afebrile HR  80's-150's afib 160'F-093'A systolic 35% RA  Vitals:   05/28/19 0700 05/28/19 0836  BP:  131/68  Pulse:  (!) 101  Resp: (!) 22 (!) 29  Temp:  98.6 F (37 C)  SpO2:  95%    Physical Exam: Cardiac:  irregular Lungs:  Non labored Abdomen:  Soft, non tender, non distended; +BS; +flatus; +BM Incisions:  Laparotomy incision is clean and dry Extremities:  Bounding DP pulses bilaterally  CBC    Component Value Date/Time   WBC 15.2 (H) 05/26/2019 0447   RBC 4.49 05/26/2019 0447   HGB 14.3 05/26/2019 0447   HCT 42.6 05/26/2019 0447   PLT 182 05/26/2019 0447   MCV 94.9 05/26/2019 0447   MCH 31.8 05/26/2019 0447   MCHC 33.6 05/26/2019 0447   RDW 13.5 05/26/2019 0447   LYMPHSABS 2.0 06/06/2015 1020   MONOABS 0.3 06/06/2015 1020   EOSABS 0.2 06/06/2015 1020   BASOSABS 0.0 06/06/2015 1020    BMET    Component Value Date/Time   NA 134 (L) 05/26/2019 0447   NA 140 07/30/2017 1212   K 4.2 05/26/2019 0447   CL 104 05/26/2019 0447   CO2 23 05/26/2019 0447   GLUCOSE 171 (H) 05/26/2019 0447   BUN 17 05/26/2019 0447   BUN 18 07/30/2017 1212   CREATININE 1.11 05/26/2019 0447   CALCIUM 8.4 (L) 05/26/2019 0447   GFRNONAA >60 05/26/2019 0447   GFRAA >60 05/26/2019 0447    INR    Component Value Date/Time   INR 1.2 05/25/2019 1438     Intake/Output Summary (Last 24 hours) at 05/28/2019 0917 Last data filed at 05/28/2019 0841 Gross per 24 hour  Intake 0 ml  Output 2100 ml  Net -2100 ml     Assessment/Plan:  63 y.o. male is s/p  Resection grafting with non-aortic aneurysm repair with 16 x 8 Hemashield aorto to bilateral common iliac artery bypass 3 Days Post-Op  -pt doing well this morning-bounding DP pulses bilaterally  -pt passing flatus and has had a BM x 2-will advance diet to clears.  Discussed with pt to take it slow.  If he  tolerates without problems, will advance diet at supper time.  Decrease IVF to 50cc/hr -continue mobilization -discussed with pt it is common to have soreness in testes after surgery and will improve -possibly home tomorrow or Monday if he continues to do well and tolerate diet.    Leontine Locket, PA-C [facility] [phone] 05/28/2019 9:17 AM   I have examined the patient, reviewed and agree with above.  Curt Jews, MD 05/28/2019 9:35 AM

## 2019-05-29 MED ORDER — METOPROLOL SUCCINATE ER 25 MG PO TB24
25.0000 mg | ORAL_TABLET | Freq: Every day | ORAL | Status: DC
  Administered 2019-05-29: 25 mg via ORAL
  Filled 2019-05-29: qty 1

## 2019-05-29 MED ORDER — HEPARIN SODIUM (PORCINE) 5000 UNIT/ML IJ SOLN
5000.0000 [IU] | Freq: Three times a day (TID) | INTRAMUSCULAR | Status: DC
  Administered 2019-05-29: 5000 [IU] via SUBCUTANEOUS
  Filled 2019-05-29: qty 1

## 2019-05-29 MED ORDER — HYDROCODONE-ACETAMINOPHEN 5-325 MG PO TABS
1.0000 | ORAL_TABLET | Freq: Three times a day (TID) | ORAL | Status: DC
  Administered 2019-05-29: 1 via ORAL
  Filled 2019-05-29: qty 1

## 2019-05-29 NOTE — Progress Notes (Signed)
Saw pt this afternoon.  He has tolerated solid foods without difficulty.  Discussed blood pressure medications and asked him to f/u with Dr. Irish Lack in the next week for evaluation.  He expressed understanding and agreed.    Discharge home this afternoon.   Leontine Locket, [facility] 05/29/2019 1:50 PM

## 2019-05-29 NOTE — Progress Notes (Signed)
Patient discharged today and picked up by his girlfriend.  Patient's IV was removed and patient was taken off telemetry.  Patient left with all of his personal belongings.  AVS documentation was reviewed with patient and all questions were answered.

## 2019-05-29 NOTE — Care Management (Signed)
Patient states he has a RW and does not need a new one.

## 2019-05-29 NOTE — Progress Notes (Addendum)
  Progress Note    05/29/2019 7:27 AM 4 Days Post-Op  Subjective:  Ready to go home.  No N/V with clears.  Still passing gas  Afebrile HR 80's-110's afib 030'S-923'R systolic 00% RA  Vitals:   05/29/19 0012 05/29/19 0423  BP: (!) 148/93 113/70  Pulse: 100 95  Resp: (!) 25 18  Temp: 97.7 F (36.5 C) 97.8 F (36.6 C)  SpO2: 98% 96%    Physical Exam: Cardiac:  irregular Lungs:  Non labored Incisions:  Healing nicely Extremities:  Bounding DP pulses bilaterally Abdomen:  Soft, NT/ND; +flatus; -N/V  CBC    Component Value Date/Time   WBC 15.2 (H) 05/26/2019 0447   RBC 4.49 05/26/2019 0447   HGB 14.3 05/26/2019 0447   HCT 42.6 05/26/2019 0447   PLT 182 05/26/2019 0447   MCV 94.9 05/26/2019 0447   MCH 31.8 05/26/2019 0447   MCHC 33.6 05/26/2019 0447   RDW 13.5 05/26/2019 0447   LYMPHSABS 2.0 06/06/2015 1020   MONOABS 0.3 06/06/2015 1020   EOSABS 0.2 06/06/2015 1020   BASOSABS 0.0 06/06/2015 1020    BMET    Component Value Date/Time   NA 134 (L) 05/26/2019 0447   NA 140 07/30/2017 1212   K 4.2 05/26/2019 0447   CL 104 05/26/2019 0447   CO2 23 05/26/2019 0447   GLUCOSE 171 (H) 05/26/2019 0447   BUN 17 05/26/2019 0447   BUN 18 07/30/2017 1212   CREATININE 1.11 05/26/2019 0447   CALCIUM 8.4 (L) 05/26/2019 0447   GFRNONAA >60 05/26/2019 0447   GFRAA >60 05/26/2019 0447    INR    Component Value Date/Time   INR 1.2 05/25/2019 1438     Intake/Output Summary (Last 24 hours) at 05/29/2019 0727 Last data filed at 05/29/2019 0013 Gross per 24 hour  Intake 640 ml  Output 2700 ml  Net -2060 ml     Assessment:  63 y.o. male is s/p:  Resection grafting with non-aortic aneurysm repair with 16 x 8 Hemashield aorto to bilateral common iliac artery bypass  4 Days Post-Op  Plan: -pt doing well and continues to have bounding DP pulses bilaterally -tolerated clear liq diet.  Continues to pass flatus. Will advance to heart healthy this am-if he tolerates this  morning, may be able to discharge him later today.  Will dc IVF -continue mobilization -DVT prophylaxis:  SCD's.  If he does not discharge later today, will start sq heparin.  Pt IV metoprolol to be discontinued today and he will be restarted on Home Toprol XL 50mg  daily.  Will also resume lasix but discontinue lisinopril and hydralazine until f/u with Dr. Irish Lack to evaluate meds.   Leontine Locket, PA-C [facility] [phone] 05/29/2019 7:27 AM  I have examined the patient, reviewed and agree with above.  Curt Jews, MD 05/29/2019 8:51 AM

## 2019-05-31 NOTE — Discharge Summary (Signed)
AAA Discharge Summary    Tyler Burns [birth year] 63 y.o. male  [id number]  Admission Date: 05/25/2019  Discharge Date: 05/29/2019  Physician: No att. providers found  Admission Diagnosis: ABDOMINAL AORTIC ANEURYSM   HPI:   This is a 63 y.o. male here today for continued follow-up. He has known history of non-aortic aneurysm and underwent recent CT scan for continued follow-up. He has no new medical difficulties. He has not had any cardiac difficulties. He does have irregular heart rate. He is not on any anticoagulation.  Hospital Course:  The patient was admitted to the hospital and taken to the operating room on 05/25/2019 and underwent: Resection grafting with non-aortic aneurysm repair with 16 x 8 Hemashield aorto to bilateral common iliac artery bypass    The pt tolerated the procedure well and was transported to the PACU in good condition.   By POD 1, he was sitting in chair.  No flatus.  Pt with hx of afib and pt of Dr. Eldridge Burns and they were consulted.  He is placed on lopressor 2.5mg  IV q6h.  No heparin at this time due to extensive dissection during surgery.  NGT left in place for moderate output.  He is transferred to 4 east.  Per cardiology, pt with chronic afib and is rate controlled.  He has not been on long term AC.  Continue asa and beta blocker.   POD 2, NGT was removed.  Continue to mobilize.    POD 3, he has bounding DP pulses bilaterally.  He is passing flatus and had BM.  His diet was advanced to clear liquids.  IVF decreased.  He did have some soreness in his testes and discussed with him that this is common.    POD 3, pt was tolerating diet and it was advanced to regular diet, which he tolerated.  His IV BB was changed to his home dose.  His lasix continued and ACEI stopped until he follows up with Dr. Eldridge Burns.    The remainder of the hospital course consisted of increasing mobilization and increasing intake of solids without difficulty.  CBC     Component Value Date/Time   WBC 15.2 (H) 05/26/2019 0447   RBC 4.49 05/26/2019 0447   HGB 14.3 05/26/2019 0447   HCT 42.6 05/26/2019 0447   PLT 182 05/26/2019 0447   MCV 94.9 05/26/2019 0447   MCH 31.8 05/26/2019 0447   MCHC 33.6 05/26/2019 0447   RDW 13.5 05/26/2019 0447   LYMPHSABS 2.0 06/06/2015 1020   MONOABS 0.3 06/06/2015 1020   EOSABS 0.2 06/06/2015 1020   BASOSABS 0.0 06/06/2015 1020    BMET    Component Value Date/Time   NA 134 (L) 05/26/2019 0447   NA 140 07/30/2017 1212   K 4.2 05/26/2019 0447   CL 104 05/26/2019 0447   CO2 23 05/26/2019 0447   GLUCOSE 171 (H) 05/26/2019 0447   BUN 17 05/26/2019 0447   BUN 18 07/30/2017 1212   CREATININE 1.11 05/26/2019 0447   CALCIUM 8.4 (L) 05/26/2019 0447   GFRNONAA >60 05/26/2019 0447   GFRAA >60 05/26/2019 0447     Discharge Instructions    Discharge patient   Complete by: As directed    Discharge disposition: 01-Home or Self Care   Discharge patient date: 05/29/2019      Discharge Diagnosis:  ABDOMINAL AORTIC ANEURYSM  Secondary Diagnosis: Patient Active Problem List   Diagnosis Date Noted  . AAA (abdominal aortic aneurysm) (HCC) 05/25/2019  . CKD (chronic kidney  disease) stage 3, GFR 30-59 ml/min (HCC) 07/09/2015  . Atrial fibrillation (HCC) 09/20/2013   Past Medical History:  Diagnosis Date  . Asthma   . Dysrhythmia    Afib  . Glaucoma   . High cholesterol   . History of cardiovascular stress test    Myoview 12/19:  EF 50%, No ischemia, Low Risk  . Hypertension      Allergies as of 05/29/2019      Reactions   Brinzolamide-brimonidine Shortness Of Breath   Feels he has difficulty breathing when on medication Eye drop medication      Medication List    STOP taking these medications   hydrALAZINE 50 MG tablet Commonly known as: APRESOLINE   lisinopril 20 MG tablet Commonly known as: ZESTRIL     TAKE these medications   aspirin EC 81 MG tablet Take 1 tablet (81 mg total) by mouth daily.    bimatoprost 0.01 % Soln Commonly known as: LUMIGAN Place 1 drop into the left eye at bedtime.   brimonidine 0.1 % Soln Commonly known as: ALPHAGAN P Place 1 drop into both eyes 2 (two) times a day.   brinzolamide 1 % ophthalmic suspension Commonly known as: AZOPT Place 1 drop into both eyes 2 (two) times a day.   cetirizine 10 MG tablet Commonly known as: ZYRTEC Take 1 tablet (10 mg total) by mouth daily. One tab daily for allergies   Fish Oil 600 MG Caps Take 600 mg by mouth daily.   furosemide 40 MG tablet Commonly known as: LASIX Take 1 tablet (40 mg total) by mouth daily.   HYDROcodone-acetaminophen 5-325 MG tablet Commonly known as: NORCO/VICODIN Take 1 tablet by mouth 3 (three) times daily.   lovastatin 40 MG tablet Commonly known as: MEVACOR Take 60 mg by mouth daily.   metoprolol succinate 50 MG 24 hr tablet Commonly known as: TOPROL-XL Take 50 mg by mouth daily. Take with or immediately following a meal. What changed: Another medication with the same name was removed. Continue taking this medication, and follow the directions you see here.   Turmeric Curcumin 500 MG Caps Take 500 mg by mouth daily.   vitamin E 400 UNIT capsule Take 400 Units by mouth daily.       Instructions:  [facility] Dis[facility] Inst[facility]   Open Aortic Surgery  Please refer to the following instructions for your post-procedure care. Your surgeon or Physician Assistant will discuss any changes with you.  Activity  Avoid lifting more than eight pounds (a gallon of milk) until after your first post-operative visit. You are encouraged to walk as much as you can. You can slowly return to normal activities but must avoid strenuous activity and heavy lifting until your doctor tells you it's okay. Heavy lifting can hurt the incision and cause a hernia. Avoid activities such as vacuuming or swinging a golf club. It is normal to feel tired for several  weeks after your surgery. Do not drive until your doctor gives the okay and you are no longer taking prescription pain medications. It is also normal to have difficulty with sleep habits, eating and bowl movements after surgery. These will go away with time.  Bathing/Showering  Shower daily after you go home. Do not soak in a bathtub, hot tub, or swim until the incision heals.  Incision Care  Shower every day. Clean your incision with mild soap and water. Pat the area dry with a clean towel. You do not need  a bandage unless otherwise instructed. Do not apply any ointments or creams to your incision. You may have skin glue on your incision. Do not peel it off. It will come off on its own in about one week. If you have staples or sutures along your incision, they will be removed at your post op appointment.  If you have groin incisions, wash the groin wounds with soap and water daily and pat dry. (No tub bath-only shower)  Then put a dry gauze or washcloth in the groin to keep this area dry to help prevent wound infection.  Do this daily and as needed.  Do not use Vaseline or neosporin on your incisions.  Only use soap and water on your incisions and then protect and keep dry.  Diet  Resume your normal diet. There are no special food restriction following this procedure. A low fat/low cholesterol diet is recommended for all patients with vascular disease. After your aortic surgery, it's normal to feel full faster than usual and to not feel as hungry as you normally would. You will probably lose weight initially following your surgery. It's best to eat small, frequent meals over the course of the day. Call the office if you find that you are unable to eat even small meals.   In order to heal from your surgery, it is CRITICAL to get adequate nutrition. Your body requires vitamins, minerals, and protein. Vegetables are the best source of vitamins and minerals.  If you have pain, you may take  over-the-counter pain reliever such as acetaminophen (Tylenol). If you were prescribed a stronger pain medication, please be aware these medication can cause nausea and constipation. Prevent nausea by taking the medication with a snack or meal. Avoid constipation by drinking plenty of fluids and eating foods with a high amount of fiber, such as fruits, vegetables and grains. Take 100mg  of the over-the-counter stool softener Colace twice a day as needed to help with constipation. A laxative, such as Milk of Magnesia, may be recommended for you at this time. Do not take a laxative unless your surgeon or P.A. tells you it's OK.  Do not take Tylenol if you are taking stronger pain medications (such as Percocet).  Follow Up  Our office will schedule a follow up appointment 2-3 weeks after discharge.  Please call us immediately for any of the following conditions    .     Severe or worsening pain in your legs or feet or in your abdomen back or chest. . Increased pain, redness drainage (pus) from your incision site. . Increased abdominal pain, bloating, nausea, vomiting, or persistent diarrhea. . Fever of 101 degrees or higher. . Swelling in your leg (s). .  Reduce your risk of vascular disease  . Stop smoking. If you would like help, call [facility] at 1-800-QUIT-NOW (915-574-38501-731-745-7562) or [facility] at 902-374-7133(229)503-1118. . Manage your cholesterol . Maintain a desired weight . Control your diabetes . Keep your blood pressure down .  If you have any questions please call the office at [phone].   Prescriptions given: No pain meds given due to receiving Vicodin #80 on 7/21.   Disposition: home  Patient's condition: is Good  Follow up: 1. Dr. Arbie CookeyEarly in 2 weeks   Doreatha MassedSamantha Tosha Belgarde, PA-C [facility] [phone] 05/31/2019  3:28 PM   - For VQI Registry use -  Post-op:  Time to Extubation: [x]  In OR, [ ]  < 12 hrs, [ ]  12-24 hrs, [ ]  >=24 hrs  Vasopressors Req. Post-op: No  ICU Stay: 1 day in ICU Transfusion: No   If yes, n/a units given MI: No, [ ]  Troponin only, [ ]  EKG or Clinical New Arrhythmia: No  Complications: CHF: No Resp failure: No, [ ]  Pneumonia, [ ]  Ventilator Chg in renal function: No, [ ]  Inc. Cr > 0.5, [ ]  Temp. Dialysis, [ ]  Permanent dialysis Leg ischemia: No, no Surgery needed, [ ]  Yes, Surgery needed, [ ]  Amputation Bowel ischemia: No, [ ]  Medical Rx, [ ]  Surgical Rx Wound complication: No, [ ]  Superficial separation/infection, [ ]  Return to OR Return to OR: No  Return to OR for bleeding: No Stroke: No, [ ]  Minor, [ ]  Major  Discharge medications: Statin use:  Yes  ASA use:  Yes   Plavix use:  No  Beta blocker use:  Yes  ACEI use:  No ARB use:  No CCB use:  No Coumadin use:  No

## 2019-06-16 ENCOUNTER — Telehealth: Payer: Self-pay | Admitting: Vascular Surgery

## 2019-06-16 NOTE — Telephone Encounter (Signed)
[facility] sent over a request signed by patient to send surgical notes.  Notes where faxed over to [place]

## 2019-06-20 ENCOUNTER — Telehealth (HOSPITAL_COMMUNITY): Payer: Self-pay | Admitting: Rehabilitation

## 2019-06-20 NOTE — Telephone Encounter (Signed)

## 2019-06-21 ENCOUNTER — Other Ambulatory Visit: Payer: Self-pay

## 2019-06-21 ENCOUNTER — Ambulatory Visit (INDEPENDENT_AMBULATORY_CARE_PROVIDER_SITE_OTHER): Payer: Self-pay | Admitting: Vascular Surgery

## 2019-06-21 ENCOUNTER — Encounter: Payer: Self-pay | Admitting: Vascular Surgery

## 2019-06-21 VITALS — BP 120/87 | HR 94 | Temp 97.4°F | Resp 20 | Ht 72.0 in | Wt 232.9 lb

## 2019-06-21 DIAGNOSIS — I714 Abdominal aortic aneurysm, without rupture, unspecified: Secondary | ICD-10-CM

## 2019-06-21 NOTE — Progress Notes (Signed)
   Patient name: Tyler Burns MRN: [id number] DOB: 1956/04/04 Sex: male  REASON FOR VISIT: Follow-up open aneurysm repair  HPI: [name] is a 63 y.o. male here today for follow-up.  Did extremely well following open aneurysm repair.  Reports he is returned to his usual appetite.  Does report some unusual bowel function which sounds like probable constipation related to his surgery.  Will begin taking MiraLAX for this.  Also reports urinary frequency and some mild burning with urination.  No fevers.  Current Outpatient Medications  Medication Sig Dispense Refill  . aspirin EC 81 MG tablet Take 1 tablet (81 mg total) by mouth daily. 90 tablet 3  . bimatoprost (LUMIGAN) 0.01 % SOLN Place 1 drop into the left eye at bedtime.     . brimonidine (ALPHAGAN P) 0.1 % SOLN Place 1 drop into both eyes 2 (two) times a day.    . brinzolamide (AZOPT) 1 % ophthalmic suspension Place 1 drop into both eyes 2 (two) times a day.    Marland Kitchen HYDROcodone-acetaminophen (NORCO/VICODIN) 5-325 MG tablet Take 1 tablet by mouth 3 (three) times daily.    Marland Kitchen lovastatin (MEVACOR) 40 MG tablet Take 60 mg by mouth daily.   10  . metoprolol succinate (TOPROL-XL) 50 MG 24 hr tablet Take 50 mg by mouth daily. Take with or immediately following a meal.    . Omega-3 Fatty Acids (FISH OIL) 600 MG CAPS Take 600 mg by mouth daily.     . Turmeric Curcumin 500 MG CAPS Take 500 mg by mouth daily.    . vitamin E 400 UNIT capsule Take 400 Units by mouth daily.     . cetirizine (ZYRTEC) 10 MG tablet Take 1 tablet (10 mg total) by mouth daily. One tab daily for allergies (Patient not taking: Reported on 05/12/2019) 30 tablet 1  . furosemide (LASIX) 40 MG tablet Take 1 tablet (40 mg total) by mouth daily. (Patient not taking: Reported on 05/12/2019) 90 tablet 1   No current facility-administered medications for this visit.      PHYSICAL EXAM: Vitals:   06/21/19 1528  BP: 120/87  Pulse: 94  Resp: 20   Temp: (!) 97.4 F (36.3 C)  SpO2: 95%  Weight: 105.6 kg  Height: 6' (1.829 m)    GENERAL: The patient is a well-nourished male, in no acute distress. The vital signs are documented above. Abdomen incision healing very nicely with no evidence of hernia  MEDICAL ISSUES: Doing well postop from open aneurysm repair.  Will check urinalysis to rule out urinary tract infection and treat appropriately if this is the case  He does report an episode of having pain with IV flushing on the dorsum of his hand and is convinced that he has a IV broken off in his dorsum of his right hand.  I explained that it is more likely that he had suffered superficial thrombus thrombophlebitis in the vein that had the IV nevertheless will obtain plain films to rule out any foreign body.  We will follow-up with him after this as well.  We will see him back in 3 months for continued follow-up   [name], MD [facility] Tel [phone] Pager [phone]

## 2019-06-22 ENCOUNTER — Other Ambulatory Visit: Payer: Self-pay | Admitting: *Deleted

## 2019-06-22 DIAGNOSIS — R350 Frequency of micturition: Secondary | ICD-10-CM

## 2019-06-22 DIAGNOSIS — M79641 Pain in right hand: Secondary | ICD-10-CM

## 2019-06-22 NOTE — Progress Notes (Signed)
Per Dr. Luther Parody office note of 8-25, I have ordered urinalysis C&S and xray of right hand.

## 2019-06-23 ENCOUNTER — Other Ambulatory Visit: Payer: Self-pay

## 2019-06-23 ENCOUNTER — Ambulatory Visit
Admission: RE | Admit: 2019-06-23 | Discharge: 2019-06-23 | Disposition: A | Discharge status: Home / Self Care | Payer: Medicare Other | Source: Ambulatory Visit | Attending: Vascular Surgery | Admitting: Vascular Surgery

## 2019-06-23 DIAGNOSIS — M79641 Pain in right hand: Secondary | ICD-10-CM

## 2019-06-23 IMAGING — CR RIGHT HAND - 2 VIEW
2 series · 2 of 2 positions shown · non-contrast
Comparison: None.

CLINICAL DATA: Possible foreign body in the right hand, IV removed
few weeks ago

EXAM:
RIGHT HAND - 2 VIEW

[x hand pa right]
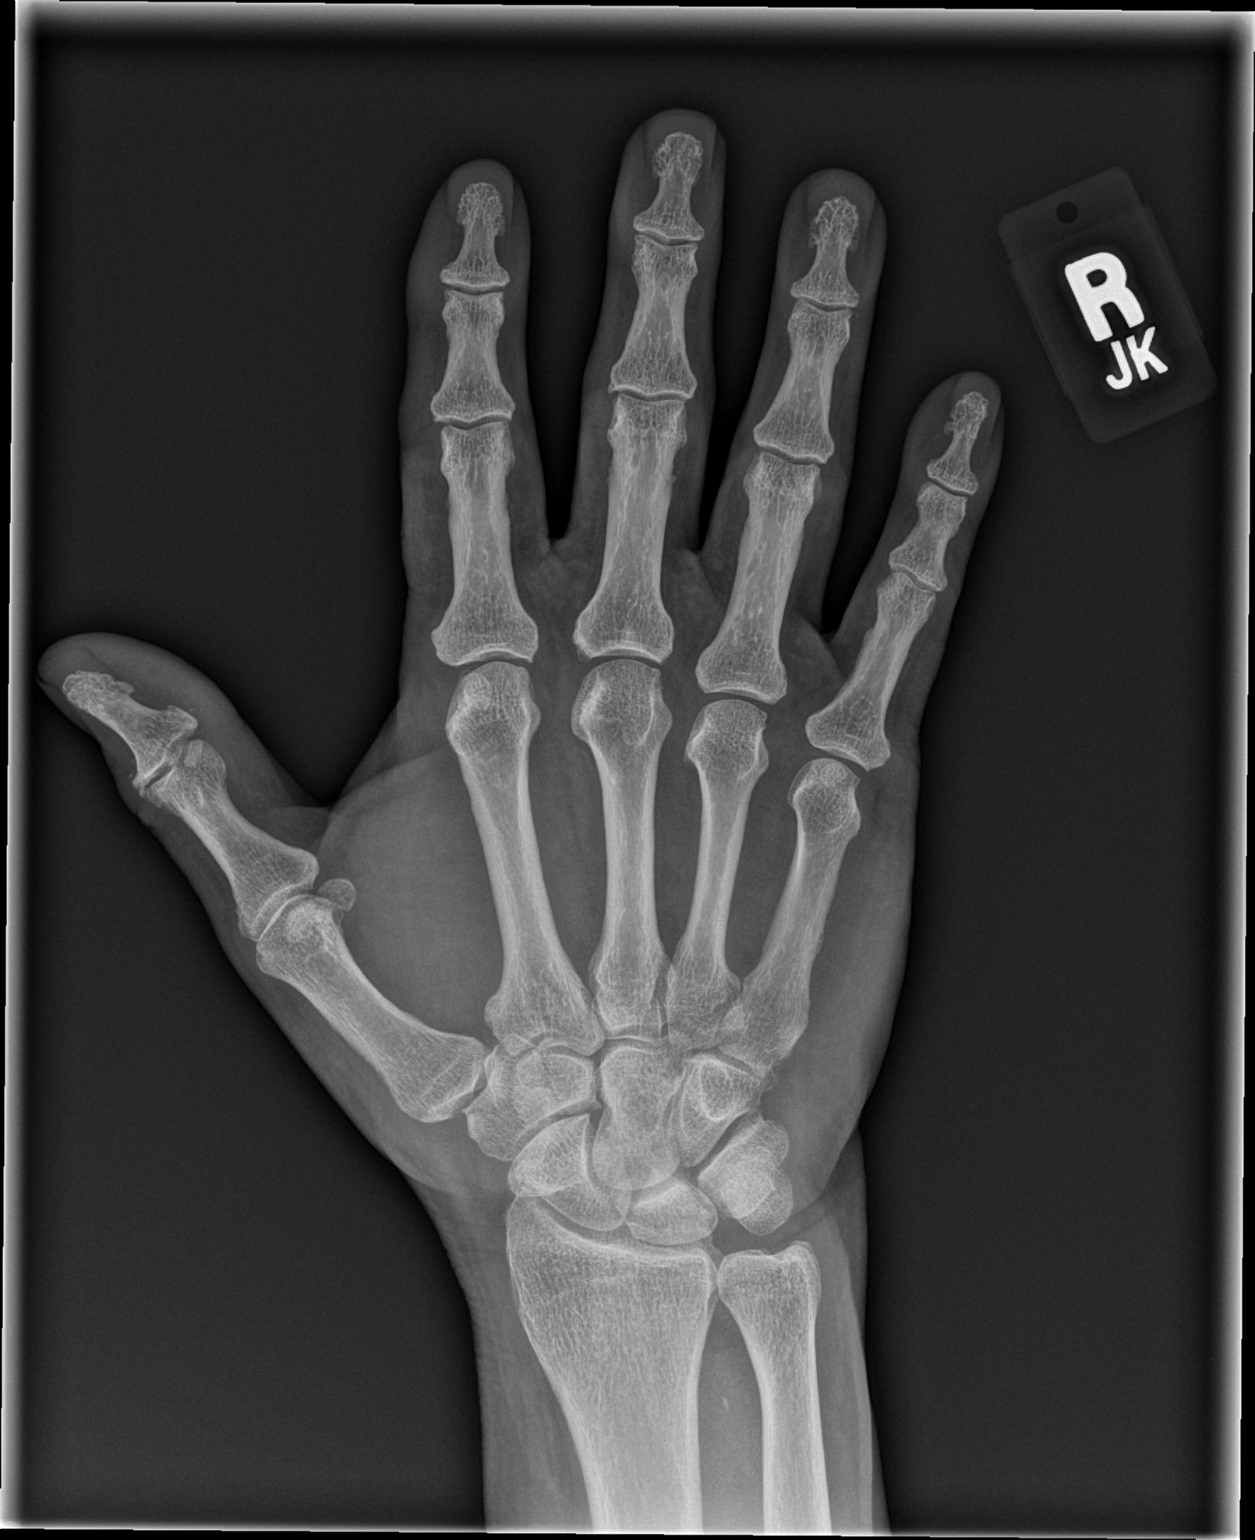

[x hand lat right]
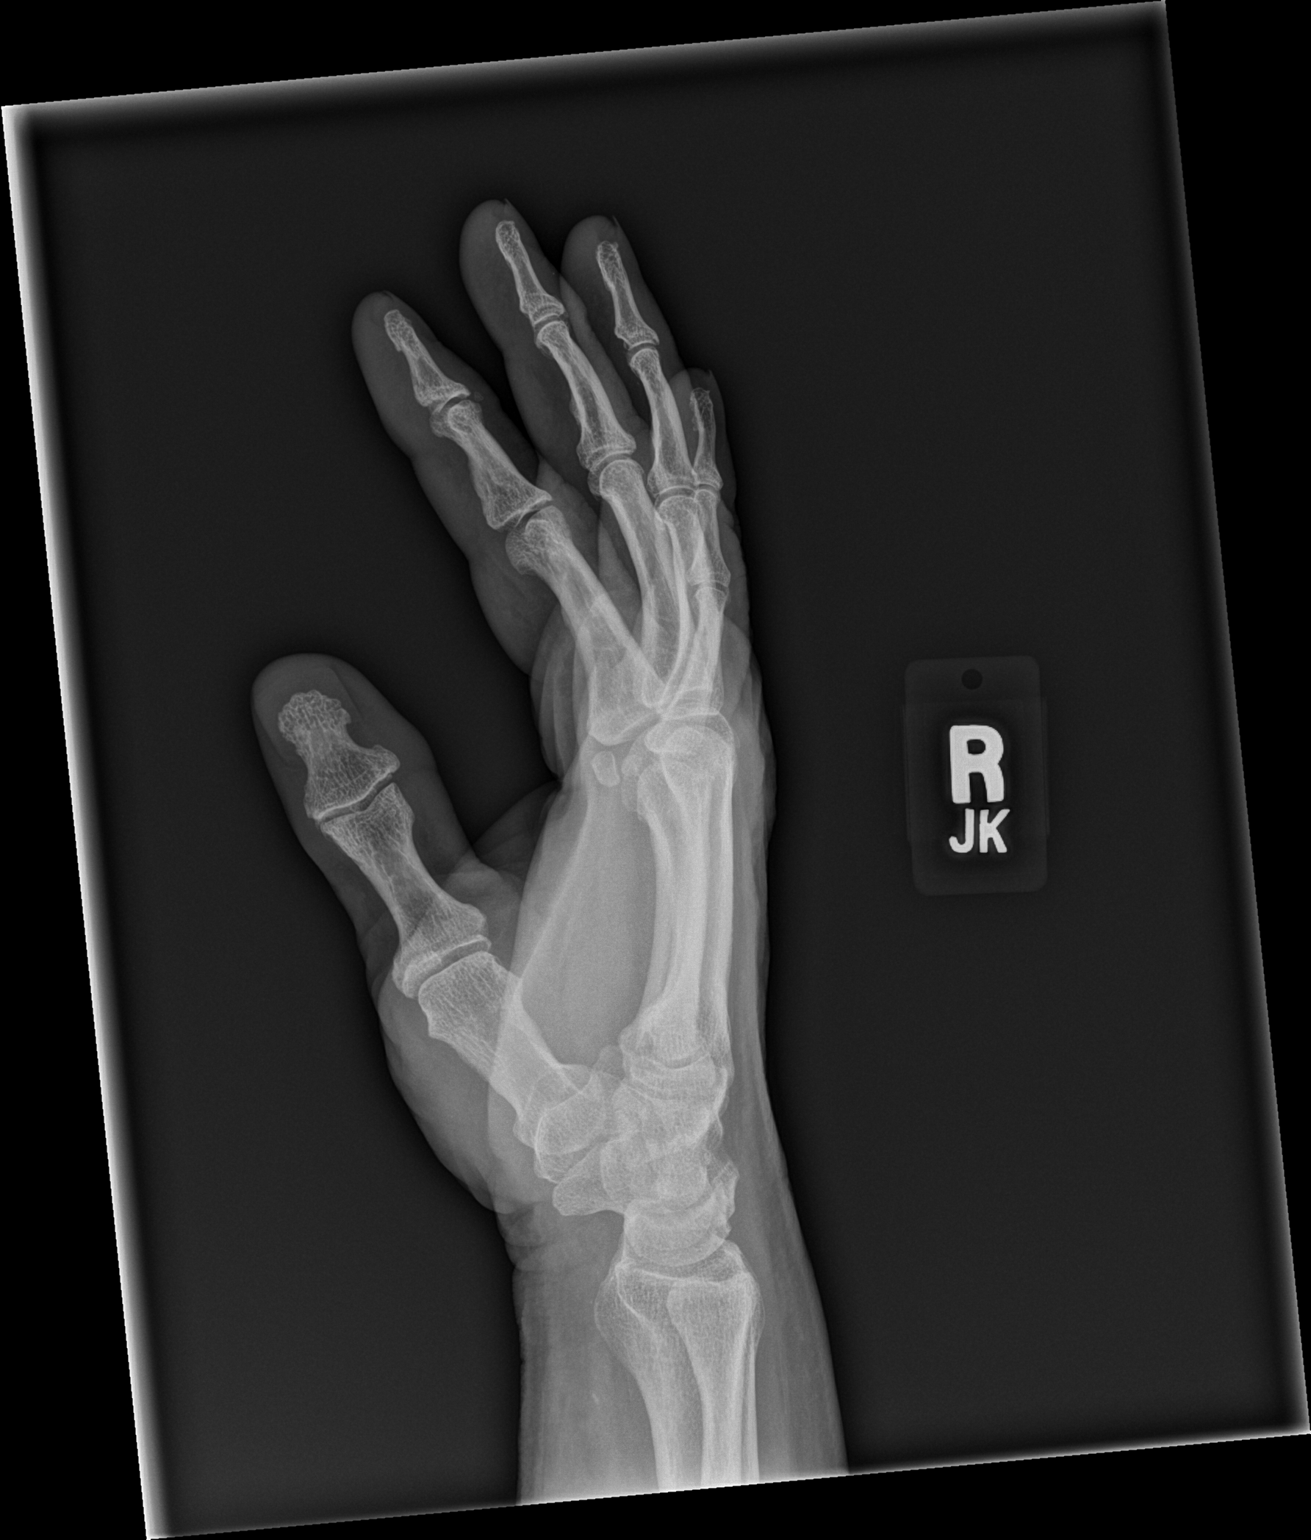

[2 of 2 positions shown; findings below may reference images not displayed]

FINDINGS: No fracture or dislocation of the right hand. Joint spaces are well
preserved. No radiopaque foreign bodies appreciated. There is an
incidental soft tissue or vascular calcification in the ventral
wrist soft tissues.
IMPRESSION: No fracture or dislocation of the right hand. Joint spaces are well
preserved. No radiopaque foreign bodies appreciated. There is an
incidental soft tissue or vascular calcification in the ventral
wrist soft tissues.

## 2019-06-24 ENCOUNTER — Telehealth: Payer: Self-pay | Admitting: *Deleted

## 2019-06-24 LAB — NO CULTURE INDICATED

## 2019-06-24 LAB — URINALYSIS W MICROSCOPIC + REFLEX CULTURE
Bacteria, UA: NONE SEEN /HPF
Bilirubin Urine: NEGATIVE
Glucose, UA: NEGATIVE
Hgb urine dipstick: NEGATIVE
Ketones, ur: NEGATIVE
Leukocyte Esterase: NEGATIVE
Nitrites, Initial: NEGATIVE
Protein, ur: NEGATIVE
Specific Gravity, Urine: 1.023 (ref 1.001–1.03)
WBC, UA: NONE SEEN /HPF (ref 0–5)
pH: <=5 (ref 5.0–8.0)

## 2019-06-24 NOTE — Telephone Encounter (Signed)
I called the patient and LMTCB regarding his UA and hand xray results.  UA was negative except for calcium oxalate crystals. His hand xray was negative for any foreign bodies. We will continue to try and get in touch with him.

## 2019-09-27 ENCOUNTER — Other Ambulatory Visit: Payer: Self-pay

## 2019-09-27 ENCOUNTER — Encounter: Payer: Self-pay | Admitting: Vascular Surgery

## 2019-09-27 ENCOUNTER — Ambulatory Visit (INDEPENDENT_AMBULATORY_CARE_PROVIDER_SITE_OTHER): Payer: Self-pay | Admitting: Vascular Surgery

## 2019-09-27 VITALS — BP 134/90 | HR 83 | Temp 87.0°F | Resp 20 | Ht 72.0 in | Wt 238.6 lb

## 2019-09-27 DIAGNOSIS — I714 Abdominal aortic aneurysm, without rupture, unspecified: Secondary | ICD-10-CM

## 2019-09-27 NOTE — Progress Notes (Signed)
   Patient name: Tyler Burns MRN: [id number] DOB: 1956/04/29 Sex: male  REASON FOR VISIT: Follow-up open repair abdominal aortic aneurysm  HPI: Tyler Burns is a 63 y.o. male here for continued follow-up.  Had aorto by common iliac artery bypass for aneurysm on 05/25/2019.  He reports that he has had complete return of his usual preoperative stamina.  Is regained weight loss around the time of surgery and reports that his bowel function is returned to normal.  He still has some urinary frequency.  He also continues to complain of pain on the dorsum of his right hand that extends into his forearm.  This was at a area of IV and seems to be irritation to a sensory nerve.  He felt that there may be a retained IV but I explained that this would be extremely unlikely.  This is continuing to improve somewhat.  Current Outpatient Medications  Medication Sig Dispense Refill  . aspirin EC 81 MG tablet Take 1 tablet (81 mg total) by mouth daily. 90 tablet 3  . bimatoprost (LUMIGAN) 0.01 % SOLN Place 1 drop into the left eye at bedtime.     . brimonidine (ALPHAGAN P) 0.1 % SOLN Place 1 drop into both eyes 2 (two) times a day.    . brinzolamide (AZOPT) 1 % ophthalmic suspension Place 1 drop into both eyes 2 (two) times a day.    . cetirizine (ZYRTEC) 10 MG tablet Take 1 tablet (10 mg total) by mouth daily. One tab daily for allergies 30 tablet 1  . furosemide (LASIX) 40 MG tablet Take 1 tablet (40 mg total) by mouth daily. 90 tablet 1  . HYDROcodone-acetaminophen (NORCO/VICODIN) 5-325 MG tablet Take 1 tablet by mouth 3 (three) times daily.    Marland Kitchen lovastatin (MEVACOR) 40 MG tablet Take 60 mg by mouth daily.   10  . metoprolol succinate (TOPROL-XL) 50 MG 24 hr tablet Take 50 mg by mouth daily. Take with or immediately following a meal.    . Omega-3 Fatty Acids (FISH OIL) 600 MG CAPS Take 600 mg by mouth daily.     . Turmeric Curcumin 500 MG CAPS Take 500 mg by mouth daily.    .  vitamin E 400 UNIT capsule Take 400 Units by mouth daily.      No current facility-administered medications for this visit.      PHYSICAL EXAM: Vitals:   09/27/19 1007  BP: 134/90  Pulse: 83  Resp: 20  Temp: (!) 87 F (30.6 C)  SpO2: 94%  Weight: 238 lb 9.6 oz (108.2 kg)  Height: 6' (1.829 m)    GENERAL: The patient is a well-nourished male, in no acute distress. The vital signs are documented above. Abdomen soft nontender with no evidence of hernia  MEDICAL ISSUES: Stable overall.  Will resume full activity without limitation.  We will see him again in 9 months with ankle arm indices.  At that time we will discontinue follow-up if he remains stable   [name], MD [facility] Tel [phone] Pager [phone]

## 2019-09-28 NOTE — [place] Notes (Deleted)
Cardiology Office Note   Date:  09/28/2019   ID:  Tyler, Burns 07/16/1956, MRN [id number]  PCP:  Quitman Livings, MD    No chief complaint on file.    Wt Readings from Last 3 Encounters:  09/27/19 238 lb 9.6 oz (108.2 kg)  06/21/19 232 lb 14.4 oz (105.6 kg)  05/26/19 245 lb 6 oz (111.3 kg)       History of Present Illness: Tyler Burns is a 63 y.o. male  with a hx of chronic atrial fibrillation, CKD, mildly depressed LV function. CHADS2-VASc=1 (sCHF). He has been treated with ASA only.   He had a R inguinal hernia. He had surgical open repair with Dr. Avel Peace in 8/16. No cardiac issues with surgery. He had a low risk stress test, that showed a small area of ischemia. This was medically managed. No chest pain.  Back limits his exercise.  He walked a lot during the summer. Prior records show: " He goes about 25 minutes with a break in the middle.  He may do this several times.  He walks at a park.  Less walking in the cold weather."    He has a remote hx of smoking. He is treated for HL. He has a FHx of CAD (brother, mother with prior MI; sister died with CHF).  He had AAA repair in 04/2019.      Past Medical History:  Diagnosis Date  . Asthma   . Dysrhythmia    Afib  . Glaucoma   . High cholesterol   . History of cardiovascular stress test    Myoview 12/19:  EF 50%, No ischemia, Low Risk  . Hypertension     Past Surgical History:  Procedure Laterality Date  . ABDOMINAL AORTIC ANEURYSM REPAIR N/A 05/25/2019   Procedure: ANEURYSM ABDOMINAL AORTIC REPAIR using a HEMASHIELD GOLD VASCULAR GRAFT;  Surgeon: Larina Earthly, MD;  Location: [facility] OR;  Service: Vascular;  Laterality: N/A;  . APPENDECTOMY    . EYE SURGERY Right    cataract  . INGUINAL HERNIA REPAIR Right 06/15/2015   Procedure: RIGHT INGUINAL HERNIA REPAIR WITH MESH;  Surgeon: Avel Peace, MD;  Location: [facility] OR;  Service: General;  Laterality: Right;  . INSERTION OF MESH Right  06/15/2015   Procedure: INSERTION OF MESH;  Surgeon: Avel Peace, MD;  Location: [facility] OR;  Service: General;  Laterality: Right;     Current Outpatient Medications  Medication Sig Dispense Refill  . aspirin EC 81 MG tablet Take 1 tablet (81 mg total) by mouth daily. 90 tablet 3  . bimatoprost (LUMIGAN) 0.01 % SOLN Place 1 drop into the left eye at bedtime.     . brimonidine (ALPHAGAN P) 0.1 % SOLN Place 1 drop into both eyes 2 (two) times a day.    . brinzolamide (AZOPT) 1 % ophthalmic suspension Place 1 drop into both eyes 2 (two) times a day.    . cetirizine (ZYRTEC) 10 MG tablet Take 1 tablet (10 mg total) by mouth daily. One tab daily for allergies 30 tablet 1  . furosemide (LASIX) 40 MG tablet Take 1 tablet (40 mg total) by mouth daily. 90 tablet 1  . HYDROcodone-acetaminophen (NORCO/VICODIN) 5-325 MG tablet Take 1 tablet by mouth 3 (three) times daily.    Marland Kitchen lovastatin (MEVACOR) 40 MG tablet Take 60 mg by mouth daily.   10  . metoprolol succinate (TOPROL-XL) 50 MG 24 hr tablet Take 50 mg by mouth daily. Take with  or immediately following a meal.    . Omega-3 Fatty Acids (FISH OIL) 600 MG CAPS Take 600 mg by mouth daily.     . Turmeric Curcumin 500 MG CAPS Take 500 mg by mouth daily.    . vitamin E 400 UNIT capsule Take 400 Units by mouth daily.      No current facility-administered medications for this visit.     Allergies:   Brinzolamide-brimonidine    Social History:  The patient  reports that he quit smoking about 50 years ago. He has never used smokeless tobacco. He reports that he does not drink alcohol or use drugs.   Family History:  The patient's ***family history includes Congestive Heart Failure in his mother; Heart attack (age of onset: 37) in his brother; Heart attack (age of onset: 70) in his mother; Heart failure (age of onset: 61) in his sister; Leukemia in his brother.    ROS:  Please see the history of present illness.   Otherwise, review of systems are positive  for ***.   All other systems are reviewed and negative.    PHYSICAL EXAM: VS:  There were no vitals taken for this visit. , BMI There is no height or weight on file to calculate BMI. GEN: Well nourished, well developed, in no acute distress  HEENT: normal  Neck: no JVD, carotid bruits, or masses Cardiac: ***RRR; no murmurs, rubs, or gallops,no edema  Respiratory:  clear to auscultation bilaterally, normal work of breathing GI: soft, nontender, nondistended, + BS MS: no deformity or atrophy  Skin: warm and dry, no rash Neuro:  Strength and sensation are intact Psych: euthymic mood, full affect   EKG:   The ekg ordered today demonstrates ***   Recent Labs: 05/26/2019: ALT 19; BUN 17; Creatinine, Ser 1.11; Hemoglobin 14.3; Magnesium 2.3; Platelets 182; Potassium 4.2; Sodium 134   Lipid Panel No results found for: CHOL, TRIG, HDL, CHOLHDL, VLDL, LDLCALC, LDLDIRECT   Other studies Reviewed: Additional studies/ records that were reviewed today with results demonstrating: ***.   ASSESSMENT AND PLAN:  1. AFib:  2. AAA: 3. HTN:   Current medicines are reviewed at length with the patient today.  The patient concerns regarding his medicines were addressed.  The following changes have been made:  No change***  Labs/ tests ordered today include: *** No orders of the defined types were placed in this encounter.   Recommend 150 minutes/week of aerobic exercise Low fat, low carb, high fiber diet recommended  Disposition:   FU in ***   Signed, Larae Grooms, MD  09/28/2019 5:13 PM    [facility] [place], [place], [place]  [address] Phone: [phone]; Fax: [phone]

## 2019-09-30 ENCOUNTER — Ambulatory Visit: Payer: Medicare Other | Admitting: Interventional Cardiology

## 2019-10-18 ENCOUNTER — Other Ambulatory Visit: Payer: Self-pay | Admitting: *Deleted

## 2019-10-18 DIAGNOSIS — I714 Abdominal aortic aneurysm, without rupture, unspecified: Secondary | ICD-10-CM

## 2019-10-31 NOTE — [place] Notes (Signed)
Cardiology Office Note    Date:  11/01/2019   ID:  Tyler Burns, DOB Mar 23, 1956, MRN [id number]  PCP:  Quitman Livings, MD  Cardiologist:  Dr. Eldridge Dace  Chief Complaint: 12 Months follow up  History of Present Illness:   Tyler Burns is a 64 y.o. male  with a history of permanent atrial fibrillation on Aspirin but no anticoagulation, mild LV dysfunction with EF of 45-50%, AAA s/p repair 05/25/19 followed by Dr. Arbie Cookey, hypertension, hyperlipidemia, renal insufficiency, chronic back pain, and glaucoma presents for yearly follow up.  Patient was initially diagnosed with atrial fibrillation in 2014. Echocardiogram at that time showed LVEF of 45-50% with diffuse hypokinesis. Given his young age, cardioversion was discussed but patient did not want to pursue that option at that time. He was started on Toprol and Aspirin 325mg . He had a nuclear stress test in 2016 as part of pre-operative evaluation for a hernia repair. Study showed a small area of moderate inferoseptal ischemia but was considered low risk. Patient denied any angina at that time so no further ischemic evaluation was performed. Most recent stress test 09/2018 was low risk. No perfusion defects.   Most recently underwent open repair of AAA on 05/25/2019 due to enlarging aneurysm. Cardiology seen the patient for afib management. Per prior note of Dr. 05/27/2019, need to consider anticoagulation once he turns 63.   Seen today for follow up.  He has not took his aspirin since AAA repair.  He is not interested in anticoagulation.  Walks occasionally without any limitation.  He denies chest pain, shortness of breath, orthopnea, PND, syncope, lower extremity edema or melena.   Past Medical History:  Diagnosis Date  . Asthma   . Dysrhythmia    Afib  . Glaucoma   . High cholesterol   . History of cardiovascular stress test    Myoview 12/19:  EF 50%, No ischemia, Low Risk  . Hypertension     Past Surgical History:  Procedure Laterality  Date  . ABDOMINAL AORTIC ANEURYSM REPAIR N/A 05/25/2019   Procedure: ANEURYSM ABDOMINAL AORTIC REPAIR using a HEMASHIELD GOLD VASCULAR GRAFT;  Surgeon: 05/27/2019, MD;  Location: [facility] OR;  Service: Vascular;  Laterality: N/A;  . APPENDECTOMY    . EYE SURGERY Right    cataract  . INGUINAL HERNIA REPAIR Right 06/15/2015   Procedure: RIGHT INGUINAL HERNIA REPAIR WITH MESH;  Surgeon: 06/17/2015, MD;  Location: [facility] OR;  Service: General;  Laterality: Right;  . INSERTION OF MESH Right 06/15/2015   Procedure: INSERTION OF MESH;  Surgeon: 06/17/2015, MD;  Location: [facility] OR;  Service: General;  Laterality: Right;    Current Medications: Prior to Admission medications   Medication Sig Start Date End Date Taking? Authorizing Provider  aspirin EC 81 MG tablet Take 1 tablet (81 mg total) by mouth daily. 09/30/18   14/5/19, MD  bimatoprost (LUMIGAN) 0.01 % SOLN Place 1 drop into the left eye at bedtime.     [provider]  brimonidine (ALPHAGAN P) 0.1 % SOLN Place 1 drop into both eyes 2 (two) times a day.    [provider]  brinzolamide (AZOPT) 1 % ophthalmic suspension Place 1 drop into both eyes 2 (two) times a day.    [provider]  cetirizine (ZYRTEC) 10 MG tablet Take 1 tablet (10 mg total) by mouth daily. One tab daily for allergies 08/07/12   10/07/12, MD  furosemide (LASIX) 40 MG tablet Take  1 tablet (40 mg total) by mouth daily. 10/17/13   Jettie Booze, MD  HYDROcodone-acetaminophen (NORCO/VICODIN) 5-325 MG tablet Take 1 tablet by mouth 3 (three) times daily.    [provider]  lovastatin (MEVACOR) 40 MG tablet Take 60 mg by mouth daily.  09/28/18   [provider]  metoprolol succinate (TOPROL-XL) 50 MG 24 hr tablet Take 50 mg by mouth daily. Take with or immediately following a meal.    [provider]  Omega-3 Fatty Acids (FISH OIL) 600 MG CAPS Take 600 mg by mouth daily.     [provider]   Turmeric Curcumin 500 MG CAPS Take 500 mg by mouth daily.    [provider]  vitamin E 400 UNIT capsule Take 400 Units by mouth daily.     [provider]    Allergies:   Brinzolamide-brimonidine   Social History   Socioeconomic History  . Marital status: Divorced    Spouse name: Not on file  . Number of children: Not on file  . Years of education: Not on file  . Highest education level: Not on file  Occupational History  . Not on file  Tobacco Use  . Smoking status: Former Smoker    Quit date: 1970    Years since quitting: 51.0  . Smokeless tobacco: Never Used  Substance and Sexual Activity  . Alcohol use: No  . Drug use: No  . Sexual activity: Not on file  Other Topics Concern  . Not on file  Social History Narrative  . Not on file   Social Determinants of Health   Financial Resource Strain:   . Difficulty of Paying Living Expenses: Not on file  Food Insecurity:   . Worried About Charity fundraiser in the Last Year: Not on file  . Ran Out of Food in the Last Year: Not on file  Transportation Needs:   . Lack of Transportation (Medical): Not on file  . Lack of Transportation (Non-Medical): Not on file  Physical Activity:   . Days of Exercise per Week: Not on file  . Minutes of Exercise per Session: Not on file  Stress:   . Feeling of Stress : Not on file  Social Connections:   . Frequency of Communication with Friends and Family: Not on file  . Frequency of Social Gatherings with Friends and Family: Not on file  . Attends Religious Services: Not on file  . Active Member of Clubs or Organizations: Not on file  . Attends Archivist Meetings: Not on file  . Marital Status: Not on file     Family History:  The patient's family history includes Congestive Heart Failure in his mother; Heart attack (age of onset: 39) in his brother; Heart attack (age of onset: 7) in his mother; Heart failure (age of onset: 61) in his sister; Leukemia in  his brother.   ROS:   Please see the history of present illness.    ROS All other systems reviewed and are negative.   PHYSICAL EXAM:   VS:  BP 124/72   Pulse 72   Ht 6' (1.829 m)   Wt 244 lb (110.7 kg)   SpO2 97%   BMI 33.09 kg/m    GEN: Well nourished, well developed, in no acute distress  HEENT: normal  Neck: no JVD, carotid bruits, or masses Cardiac: Irregular; no murmurs, rubs, or gallops,no edema  Respiratory:  clear to auscultation bilaterally, normal work of breathing GI:  soft, nontender, nondistended, + BS MS: no deformity or atrophy  Skin: warm and dry, no rash Neuro:  Alert and Oriented x 3, Strength and sensation are intact Psych: euthymic mood, full affect  Wt Readings from Last 3 Encounters:  11/01/19 244 lb (110.7 kg)  09/27/19 238 lb 9.6 oz (108.2 kg)  06/21/19 232 lb 14.4 oz (105.6 kg)      Studies/Labs Reviewed:   EKG:  EKG is not ordered today.   Recent Labs: 05/26/2019: ALT 19; BUN 17; Creatinine, Ser 1.11; Hemoglobin 14.3; Magnesium 2.3; Platelets 182; Potassium 4.2; Sodium 134   Lipid Panel No results found for: CHOL, TRIG, HDL, CHOLHDL, VLDL, LDLCALC, LDLDIRECT  Additional studies/ records that were reviewed today include:   Stress test 09/2018  Nuclear stress EF: 50%. No wall motion abnormality  There was no ST segment deviation noted during stress. No perfusion defects.  This is a low risk study. No ischemia identified.  Will see Dr. Eldridge Dace in 1 year  ASSESSMENT & PLAN:   1. Permanent atrial fibrillation Rate control.  CHA2DS2-VASc score of 2 for age and hypertension.  Discussed anticoagulation but declined.  He understands risk of stroke.  He will restart his aspirin 81 mg.  2. AAA s/p repair  Followed by vascular  3.  Hypertension -Blood pressure stable and well-controlled on beta-blocker.  4. HLD - Continue statin   Medication Adjustments/Labs and Tests Ordered: Current medicines are reviewed at length with the patient  today.  Concerns regarding medicines are outlined above.  Medication changes, Labs and Tests ordered today are listed in the Patient Instructions below. Patient Instructions  Medication Instructions:  Your physician recommends that you continue on your current medications as directed. Please refer to the Current Medication list given to you today.  *If you need a refill on your cardiac medications before your next appointment, please call your pharmacy*  Lab Work: None ordered  If you have labs (blood work) drawn today and your tests are completely normal, you will receive your results only by: Marland Kitchen MyChart Message (if you have MyChart) OR . A paper copy in the mail If you have any lab test that is abnormal or we need to change your treatment, we will call you to review the results.  Testing/Procedures: None ordered  Follow-Up: At [facility], you and your health needs are our priority.  As part of our continuing mission to provide you with exceptional heart care, we have created designated Provider Care Teams.  These Care Teams include your primary Cardiologist (physician) and Advanced Practice Providers (APPs -  Physician Assistants and Nurse Practitioners) who all work together to provide you with the care you need, when you need it.  Your next appointment:   12 month(s)  The format for your next appointment:   In Person  Provider:   Everette Rank, MD       Signed, Manson Passey, [place]  11/01/2019 2:00 PM    [facility] [address] [place], [place], [place]  [address] Phone: [phone]; Fax: [phone]

## 2019-11-01 ENCOUNTER — Encounter (INDEPENDENT_AMBULATORY_CARE_PROVIDER_SITE_OTHER): Payer: Self-pay

## 2019-11-01 ENCOUNTER — Encounter: Payer: Self-pay | Admitting: Physician Assistant

## 2019-11-01 ENCOUNTER — Ambulatory Visit: Payer: Medicare Other | Admitting: Physician Assistant

## 2019-11-01 ENCOUNTER — Other Ambulatory Visit: Payer: Self-pay

## 2019-11-01 VITALS — BP 124/72 | HR 72 | Ht 72.0 in | Wt 244.0 lb

## 2019-11-01 DIAGNOSIS — I714 Abdominal aortic aneurysm, without rupture, unspecified: Secondary | ICD-10-CM

## 2019-11-01 DIAGNOSIS — E782 Mixed hyperlipidemia: Secondary | ICD-10-CM

## 2019-11-01 DIAGNOSIS — I4821 Permanent atrial fibrillation: Secondary | ICD-10-CM

## 2019-11-01 DIAGNOSIS — I10 Essential (primary) hypertension: Secondary | ICD-10-CM | POA: Diagnosis not present

## 2019-11-01 NOTE — Patient Instructions (Signed)
Medication Instructions:  Your physician recommends that you continue on your current medications as directed. Please refer to the Current Medication list given to you today.  *If you need a refill on your cardiac medications before your next appointment, please call your pharmacy*  Lab Work: None ordered  If you have labs (blood work) drawn today and your tests are completely normal, you will receive your results only by: Marland Kitchen MyChart Message (if you have MyChart) OR . A paper copy in the mail If you have any lab test that is abnormal or we need to change your treatment, we will call you to review the results.  Testing/Procedures: None ordered  Follow-Up: At [facility], you and your health needs are our priority.  As part of our continuing mission to provide you with exceptional heart care, we have created designated Provider Care Teams.  These Care Teams include your primary Cardiologist (physician) and Advanced Practice Providers (APPs -  Physician Assistants and Nurse Practitioners) who all work together to provide you with the care you need, when you need it.  Your next appointment:   12 month(s)  The format for your next appointment:   In Person  Provider:   Everette Rank, MD

## 2019-11-28 ENCOUNTER — Ambulatory Visit: Payer: Medicare Other | Admitting: Interventional Cardiology

## 2019-12-06 ENCOUNTER — Other Ambulatory Visit: Payer: Self-pay

## 2019-12-06 ENCOUNTER — Ambulatory Visit: Payer: Medicare Other | Attending: Internal Medicine

## 2019-12-06 DIAGNOSIS — Z20822 Contact with and (suspected) exposure to covid-19: Secondary | ICD-10-CM

## 2019-12-07 LAB — NOVEL CORONAVIRUS, NAA
SARS-CoV-2, NAA: NOT DETECTED
SARS-CoV-2, NAA: NOT DETECTED

## 2019-12-29 ENCOUNTER — Ambulatory Visit: Payer: Medicare Other | Attending: Internal Medicine

## 2019-12-29 DIAGNOSIS — Z23 Encounter for immunization: Secondary | ICD-10-CM | POA: Insufficient documentation

## 2019-12-29 NOTE — [place] Notes (Signed)
   Covid-19 Vaccination Clinic  Name:  Tyler Burns    MRN: [id number] DOB: 03/12/1956  12/29/2019  Tyler Burns was observed post Covid-19 immunization for 15 minutes without incident. He was provided with Vaccine Information Sheet and instruction to access the V-Safe system.   Tyler Burns was instructed to call 911 with any severe reactions post vaccine: Marland Kitchen Difficulty breathing  . Swelling of face and throat  . A fast heartbeat  . A bad rash all over body  . Dizziness and weakness

## 2020-01-24 ENCOUNTER — Ambulatory Visit: Payer: Medicare Other | Attending: Internal Medicine

## 2020-01-24 DIAGNOSIS — Z23 Encounter for immunization: Secondary | ICD-10-CM

## 2020-01-24 NOTE — [place] Notes (Signed)
   Covid-19 Vaccination Clinic  Name:  Tyler Burns    MRN: [id number] DOB: [birth year]  01/24/2020  [name] was observed post Covid-19 immunization for 15 minutes without incident. He was provided with Vaccine Information Sheet and instruction to access the V-Safe system.   [name] was instructed to call 911 with any severe reactions post vaccine: Marland Kitchen Difficulty breathing  . Swelling of face and throat  . A fast heartbeat  . A bad rash all over body  . Dizziness and weakness   Immunizations Administered    Name Date Dose VIS Date Route   Pfizer COVID-19 Vaccine 01/24/2020  1:26 PM 0.3 mL 10/07/2019 Intramuscular   Manufacturer: [employer], [employer]   Lot: [id number]   NDC: [address]-7

## 2021-05-27 ENCOUNTER — Observation Stay (HOSPITAL_COMMUNITY): Payer: Medicare Other

## 2021-05-27 ENCOUNTER — Emergency Department (HOSPITAL_COMMUNITY): Payer: Medicare Other

## 2021-05-27 ENCOUNTER — Observation Stay (HOSPITAL_COMMUNITY)
Admission: EM | Admit: 2021-05-27 | Discharge: 2021-05-28 | Disposition: A | Discharge status: Home / Self Care | Payer: Medicare Other | Attending: Student in an Organized Health Care Education/Training Program | Admitting: Student in an Organized Health Care Education/Training Program

## 2021-05-27 ENCOUNTER — Observation Stay (HOSPITAL_BASED_OUTPATIENT_CLINIC_OR_DEPARTMENT_OTHER): Payer: Medicare Other

## 2021-05-27 ENCOUNTER — Encounter (HOSPITAL_COMMUNITY): Payer: Self-pay | Admitting: *Deleted

## 2021-05-27 ENCOUNTER — Other Ambulatory Visit: Payer: Self-pay

## 2021-05-27 DIAGNOSIS — Z20822 Contact with and (suspected) exposure to covid-19: Secondary | ICD-10-CM | POA: Diagnosis not present

## 2021-05-27 DIAGNOSIS — Z7982 Long term (current) use of aspirin: Secondary | ICD-10-CM | POA: Diagnosis not present

## 2021-05-27 DIAGNOSIS — J45909 Unspecified asthma, uncomplicated: Secondary | ICD-10-CM | POA: Diagnosis not present

## 2021-05-27 DIAGNOSIS — R200 Anesthesia of skin: Secondary | ICD-10-CM | POA: Diagnosis present

## 2021-05-27 DIAGNOSIS — Z79899 Other long term (current) drug therapy: Secondary | ICD-10-CM | POA: Diagnosis not present

## 2021-05-27 DIAGNOSIS — I4891 Unspecified atrial fibrillation: Secondary | ICD-10-CM | POA: Diagnosis present

## 2021-05-27 DIAGNOSIS — I129 Hypertensive chronic kidney disease with stage 1 through stage 4 chronic kidney disease, or unspecified chronic kidney disease: Secondary | ICD-10-CM | POA: Insufficient documentation

## 2021-05-27 DIAGNOSIS — I4821 Permanent atrial fibrillation: Secondary | ICD-10-CM | POA: Insufficient documentation

## 2021-05-27 DIAGNOSIS — N183 Chronic kidney disease, stage 3 unspecified: Secondary | ICD-10-CM | POA: Diagnosis not present

## 2021-05-27 DIAGNOSIS — I6389 Other cerebral infarction: Secondary | ICD-10-CM | POA: Diagnosis not present

## 2021-05-27 DIAGNOSIS — Z87891 Personal history of nicotine dependence: Secondary | ICD-10-CM | POA: Diagnosis not present

## 2021-05-27 DIAGNOSIS — R29898 Other symptoms and signs involving the musculoskeletal system: Secondary | ICD-10-CM | POA: Diagnosis not present

## 2021-05-27 DIAGNOSIS — I639 Cerebral infarction, unspecified: Principal | ICD-10-CM | POA: Diagnosis present

## 2021-05-27 DIAGNOSIS — E785 Hyperlipidemia, unspecified: Secondary | ICD-10-CM | POA: Diagnosis present

## 2021-05-27 DIAGNOSIS — I10 Essential (primary) hypertension: Secondary | ICD-10-CM | POA: Diagnosis present

## 2021-05-27 LAB — DIFFERENTIAL
Abs Immature Granulocytes: 0.04 10*3/uL (ref 0.00–0.07)
Basophils Absolute: 0.1 10*3/uL (ref 0.0–0.1)
Basophils Relative: 1 %
Eosinophils Absolute: 0.3 10*3/uL (ref 0.0–0.5)
Eosinophils Relative: 4 %
Immature Granulocytes: 1 %
Lymphocytes Relative: 28 %
Lymphs Abs: 2.1 10*3/uL (ref 0.7–4.0)
Monocytes Absolute: 0.7 10*3/uL (ref 0.1–1.0)
Monocytes Relative: 9 %
Neutro Abs: 4.3 10*3/uL (ref 1.7–7.7)
Neutrophils Relative %: 57 %

## 2021-05-27 LAB — LIPID PANEL
Cholesterol: 227 mg/dL — ABNORMAL HIGH (ref 0–200)
HDL: 33 mg/dL — ABNORMAL LOW (ref >40)
LDL Cholesterol: 138 mg/dL — ABNORMAL HIGH (ref 0–99)
Total CHOL/HDL Ratio: 6.9 RATIO
Triglycerides: 279 mg/dL — ABNORMAL HIGH (ref <150)
VLDL: 56 mg/dL — ABNORMAL HIGH (ref 0–40)

## 2021-05-27 LAB — RESP PANEL BY RT-PCR (FLU A&B, COVID) ARPGX2
Influenza A by PCR: NEGATIVE
Influenza B by PCR: NEGATIVE
SARS Coronavirus 2 by RT PCR: NEGATIVE

## 2021-05-27 LAB — ECHOCARDIOGRAM COMPLETE
AR max vel: 4.18 cm2
AV Area VTI: 4.2 cm2
AV Area mean vel: 3.77 cm2
AV Mean grad: 2 mmHg
AV Peak grad: 3.4 mmHg
Ao pk vel: 0.92 m/s
Area-P 1/2: 4.07 cm2
Height: 72 in
MV VTI: 2.91 cm2
S' Lateral: 3.3 cm
Weight: 3776 oz

## 2021-05-27 LAB — I-STAT CHEM 8, ED
BUN: 18 mg/dL (ref 8–23)
Calcium, Ion: 1.22 mmol/L (ref 1.15–1.40)
Chloride: 104 mmol/L (ref 98–111)
Creatinine, Ser: 1.1 mg/dL (ref 0.61–1.24)
Glucose, Bld: 122 mg/dL — ABNORMAL HIGH (ref 70–99)
HCT: 48 % (ref 39.0–52.0)
Hemoglobin: 16.3 g/dL (ref 13.0–17.0)
Potassium: 4.5 mmol/L (ref 3.5–5.1)
Sodium: 140 mmol/L (ref 135–145)
TCO2: 29 mmol/L (ref 22–32)

## 2021-05-27 LAB — CBC
HCT: 49.4 % (ref 39.0–52.0)
Hemoglobin: 16.5 g/dL (ref 13.0–17.0)
MCH: 31.4 pg (ref 26.0–34.0)
MCHC: 33.4 g/dL (ref 30.0–36.0)
MCV: 94.1 fL (ref 80.0–100.0)
Platelets: 223 K/uL (ref 150–400)
RBC: 5.25 MIL/uL (ref 4.22–5.81)
RDW: 13.6 % (ref 11.5–15.5)
WBC: 7.5 K/uL (ref 4.0–10.5)
nRBC: 0 % (ref 0.0–0.2)

## 2021-05-27 LAB — APTT: aPTT: 31 seconds (ref 24–36)

## 2021-05-27 LAB — COMPREHENSIVE METABOLIC PANEL WITH GFR
ALT: 15 U/L (ref 0–44)
AST: 18 U/L (ref 15–41)
Albumin: 3.5 g/dL (ref 3.5–5.0)
Alkaline Phosphatase: 57 U/L (ref 38–126)
Anion gap: 6 (ref 5–15)
BUN: 16 mg/dL (ref 8–23)
CO2: 29 mmol/L (ref 22–32)
Calcium: 9.1 mg/dL (ref 8.9–10.3)
Chloride: 102 mmol/L (ref 98–111)
Creatinine, Ser: 1.12 mg/dL (ref 0.61–1.24)
GFR, Estimated: >60 mL/min
Glucose, Bld: 127 mg/dL — ABNORMAL HIGH (ref 70–99)
Potassium: 4.8 mmol/L (ref 3.5–5.1)
Sodium: 137 mmol/L (ref 135–145)
Total Bilirubin: 0.5 mg/dL (ref 0.3–1.2)
Total Protein: 6.5 g/dL (ref 6.5–8.1)

## 2021-05-27 LAB — HEMOGLOBIN A1C
Hgb A1c MFr Bld: 6.3 % — ABNORMAL HIGH (ref 4.8–5.6)
Mean Plasma Glucose: 134.11 mg/dL

## 2021-05-27 LAB — PROTIME-INR
INR: 1.1 (ref 0.8–1.2)
Prothrombin Time: 13.7 seconds (ref 11.4–15.2)

## 2021-05-27 LAB — HIV ANTIBODY (ROUTINE TESTING W REFLEX): HIV Screen 4th Generation wRfx: NONREACTIVE

## 2021-05-27 IMAGING — CT CT ANGIO NECK
1 of 8 series · 8 of 33 positions shown · IV contrast (OMNI 350)
Comparison: Head CT same day

CLINICAL DATA: Left arm weakness. Neuro deficit, acute, stroke
suspected.



[Series 6: cta neck thins · axial · 0.47mm/px · z∈[+1053,+1343]mm · 8 of 932 slices shown]
[im 104/932  soft-tissue]
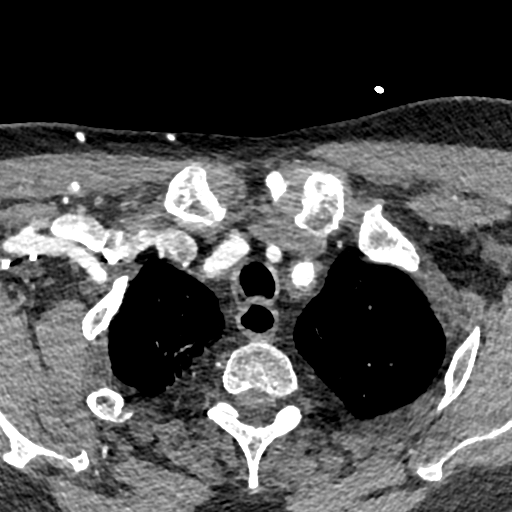
[im 207/932  bone]
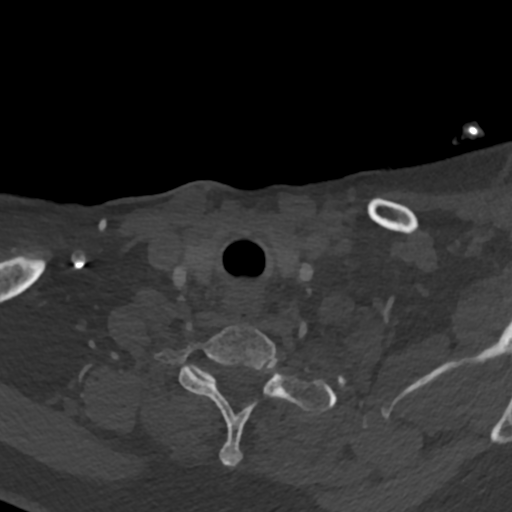
[im 311/932  soft-tissue]
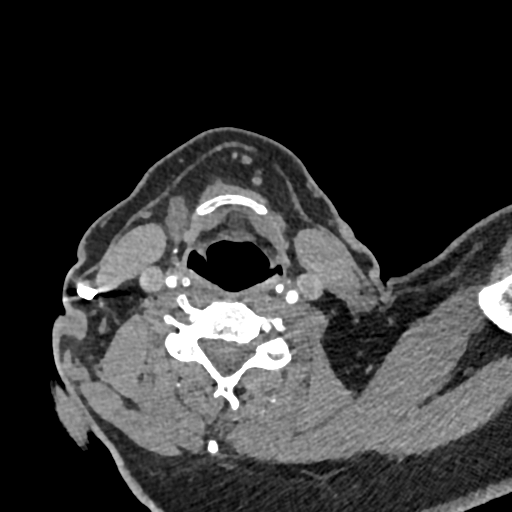
[im 414/932  bone]
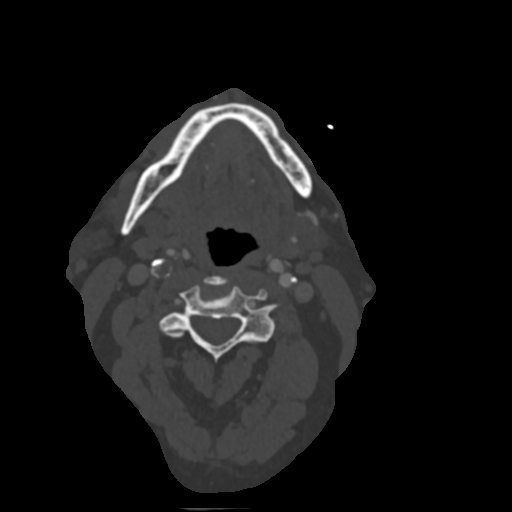
[im 518/932  soft-tissue]
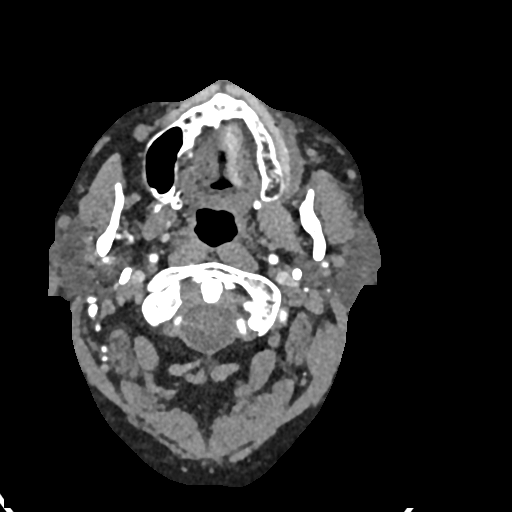
[im 621/932  bone]
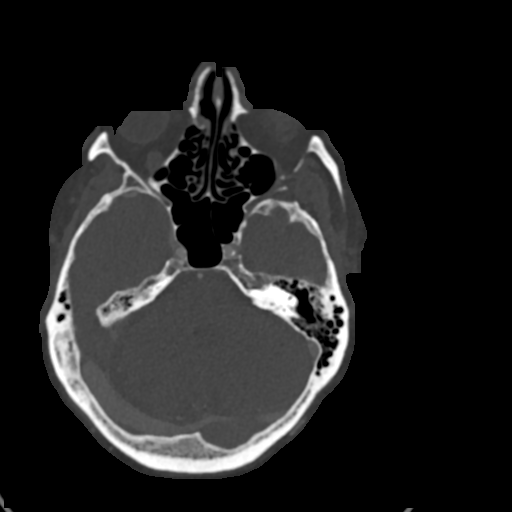
[im 725/932  soft-tissue]
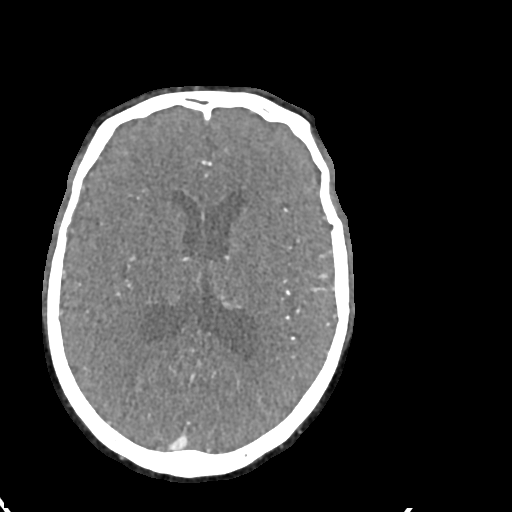
[im 828/932  bone]
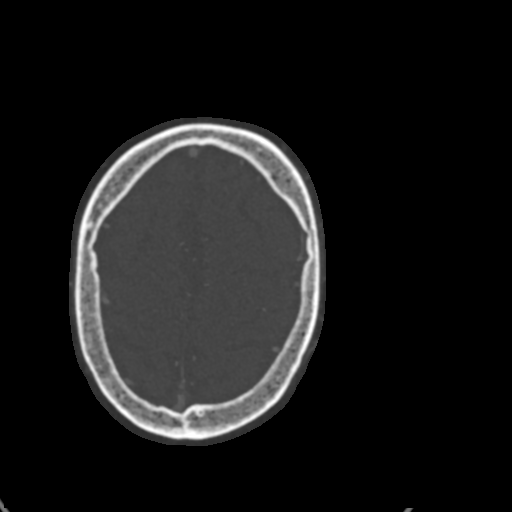

[8 of 33 positions shown; findings below may reference images not displayed]

FINDINGS: CTA NECK FINDINGS

Aortic arch: Aortic atherosclerosis. Branching pattern is normal
without origin stenosis.

Right carotid system: Common carotid artery shows some scattered
plaque but is patent to the bifurcation. No stenosis greater than
20%. At carotid bifurcation, there is soft and calcified plaque.
Very severe stenosis of the internal carotid artery at the distal
bulb, where the diameter measures 1 mm or less. Compared to an
expected distal ICA diameter of 5 mm, this would be a stenosis of
80% or greater. There is some flow reduced diameter of the distal
ICA on today's study.

Left carotid system: Common carotid artery shows some scattered
plaque but is patent to the bifurcation without stenosis greater
than 20%. Soft and calcified plaque at the carotid bifurcation and
ICA bulb. Minimal diameter in the distal bulb is 2.5 mm. Compared to
a more distal cervical ICA diameter of 5 mm, this indicates a 50%
stenosis.

Vertebral arteries: Both vertebral arteries show calcified plaque at
their origins with stenosis estimated at 50% on the right and 30-50%
on the left. Beyond the origins, the vessels are widely patent
through the cervical region to the foramen magnum.

Skeleton: Ordinary minimal spondylosis.

Other neck: No mass or lymphadenopathy.

Upper chest: Pleural and parenchymal scarring at the lung apices. No
active process evident.

Review of the MIP images confirms the above findings

CTA HEAD FINDINGS

Anterior circulation: Both internal carotid arteries are patent
through the skull base and siphon regions. There is siphon
atherosclerotic calcification with stenosis estimated at 30% on both
sides. The anterior and middle cerebral vessels are patent. No large
vessel occlusion. Distal vessels do show some atherosclerotic
irregularity.

Posterior circulation: Both vertebral arteries are patent through
the foramen magnum to the basilar. No basilar stenosis. Posterior
circulation branch vessels show flow. Left PCA takes fetal origin
from the anterior circulation.

Venous sinuses: Patent and normal.

Anatomic variants: None significant.

Review of the MIP images confirms the above findings
IMPRESSION: No intracranial large vessel occlusion. No correctable intracranial
proximal stenosis. Distal vessels show atherosclerotic irregularity.

Aortic Atherosclerosis ([YG]-[YG]).

Atherosclerotic disease at both carotid bifurcations. Extensive
primarily soft plaque at the bifurcation and ICA bulb on the right.
ICA lumen 1 mm or less, indicating greater than 80% stenosis.

Atherosclerotic plaque at the left carotid bifurcation with maximal
stenosis in the distal bulb measuring 50%.

Bilateral vertebral artery origin stenoses, 50% on the right and
30-50% on the left.

## 2021-05-27 IMAGING — MR MR HEAD W/O CM
8 of 11 series · 27 of 48 positions shown · non-contrast
Comparison: Head CT and CTA [DATE]

CLINICAL DATA: Neuro deficit, acute, stroke suspected. Left arm
weakness and numbness.

EXAM:
MRI HEAD WITHOUT CONTRAST
TECHNIQUE: Multiplanar, multiecho pulse sequences of the brain and surrounding
structures were obtained without intravenous contrast.

[Series 2: DWI · axial · 3.0mm · 0.94mm/px · z∈[-84,+74]mm · 7 of 108 slices shown (1 of 2)]
[im 1/108]
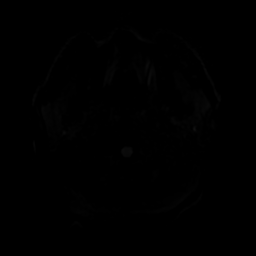
[im 18/108]
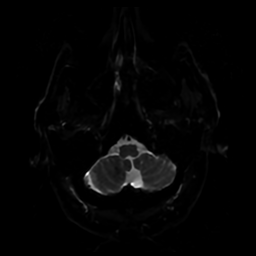
[im 36/108]
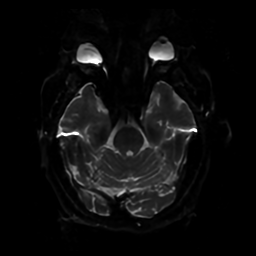
[im 54/108]
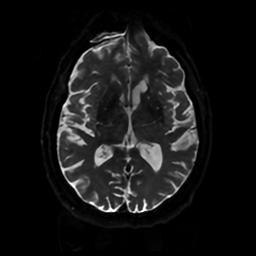
[im 72/108]
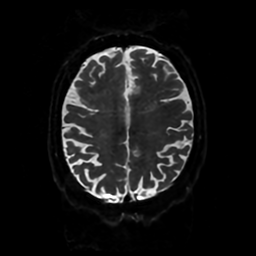
[im 90/108]
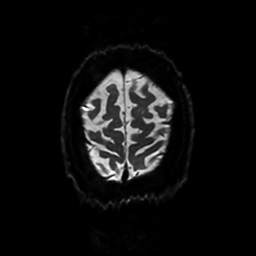
[im 108/108]
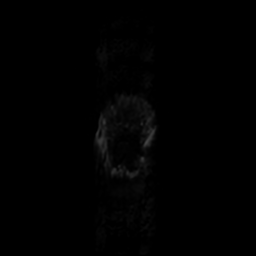

[Series 3: DWI · coronal · 4.0mm · 0.94mm/px · 5 of 73 slices shown (2 of 2)]
[im 1/73]
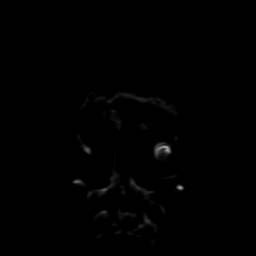
[im 19/73]
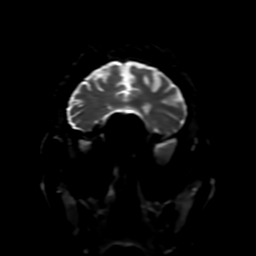
[im 37/73]
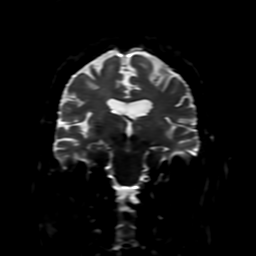
[im 55/73]
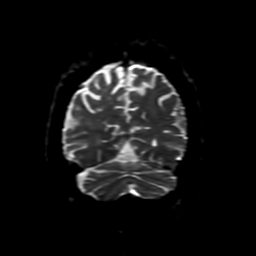
[im 73/73]
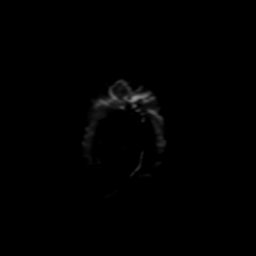

[Series 4: FLAIR · sagittal · 5.0mm · 0.47mm/px · 2 of 25 slices shown (1 of 2)]
[im 1/25]
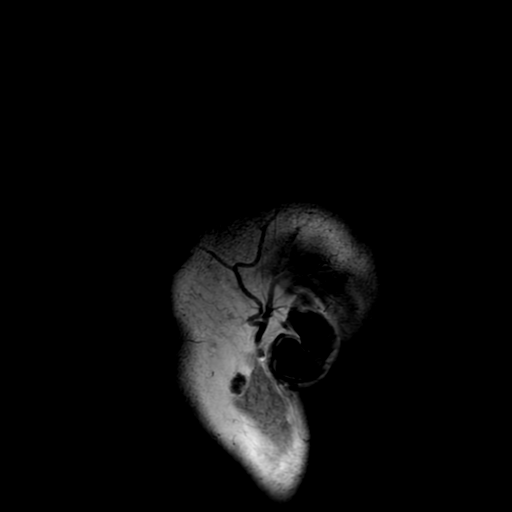
[im 25/25]
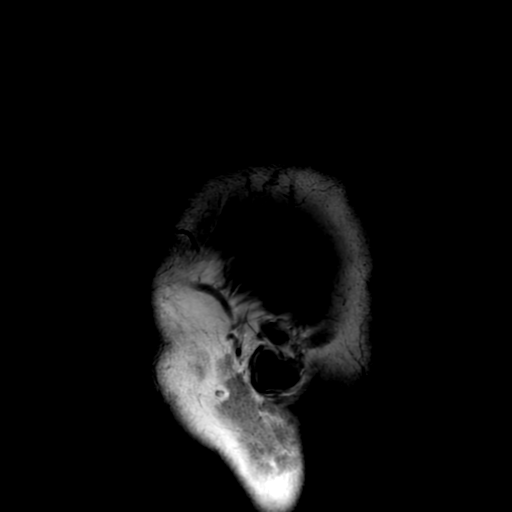

[Series 5: T2 · axial · 5.0mm · 0.23mm/px · z∈[-79,+71]mm · 2 of 26 slices shown (1 of 2)]
[im 1/26]
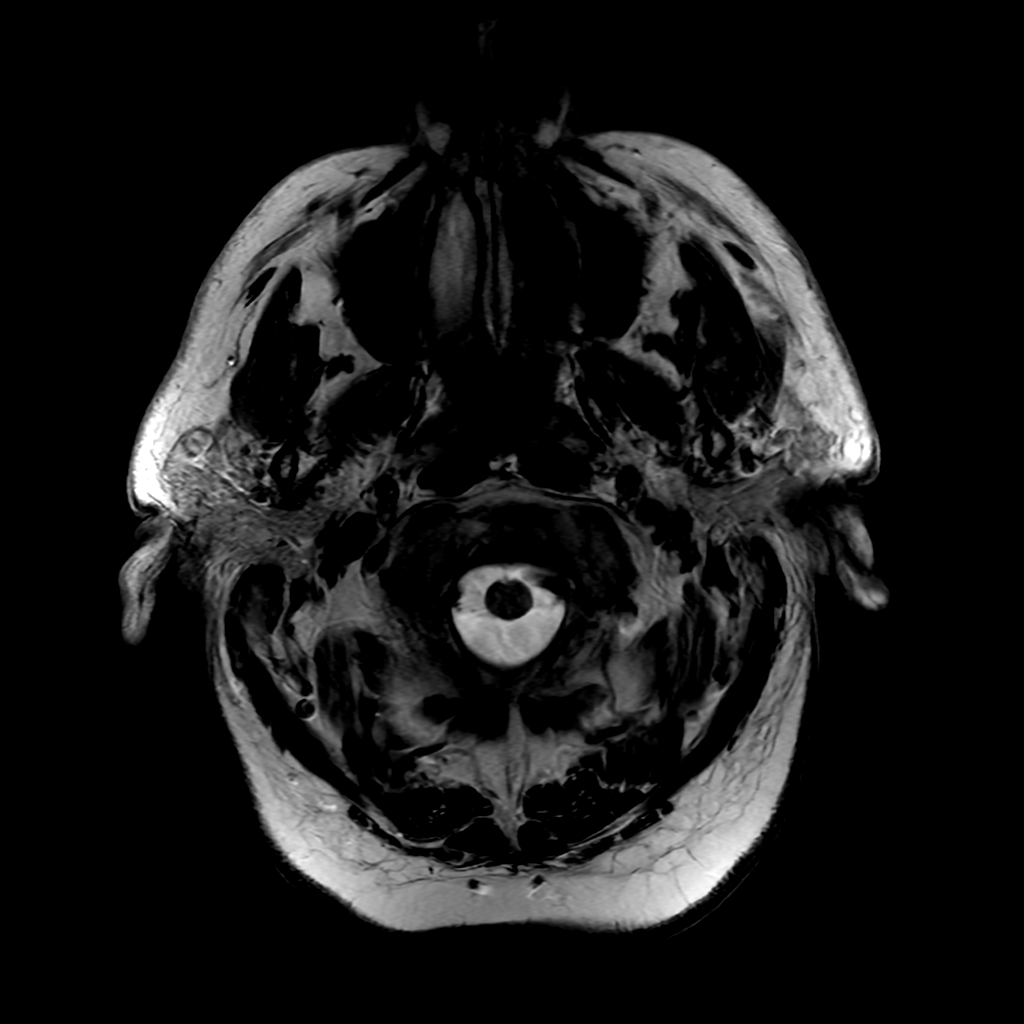
[im 26/26]
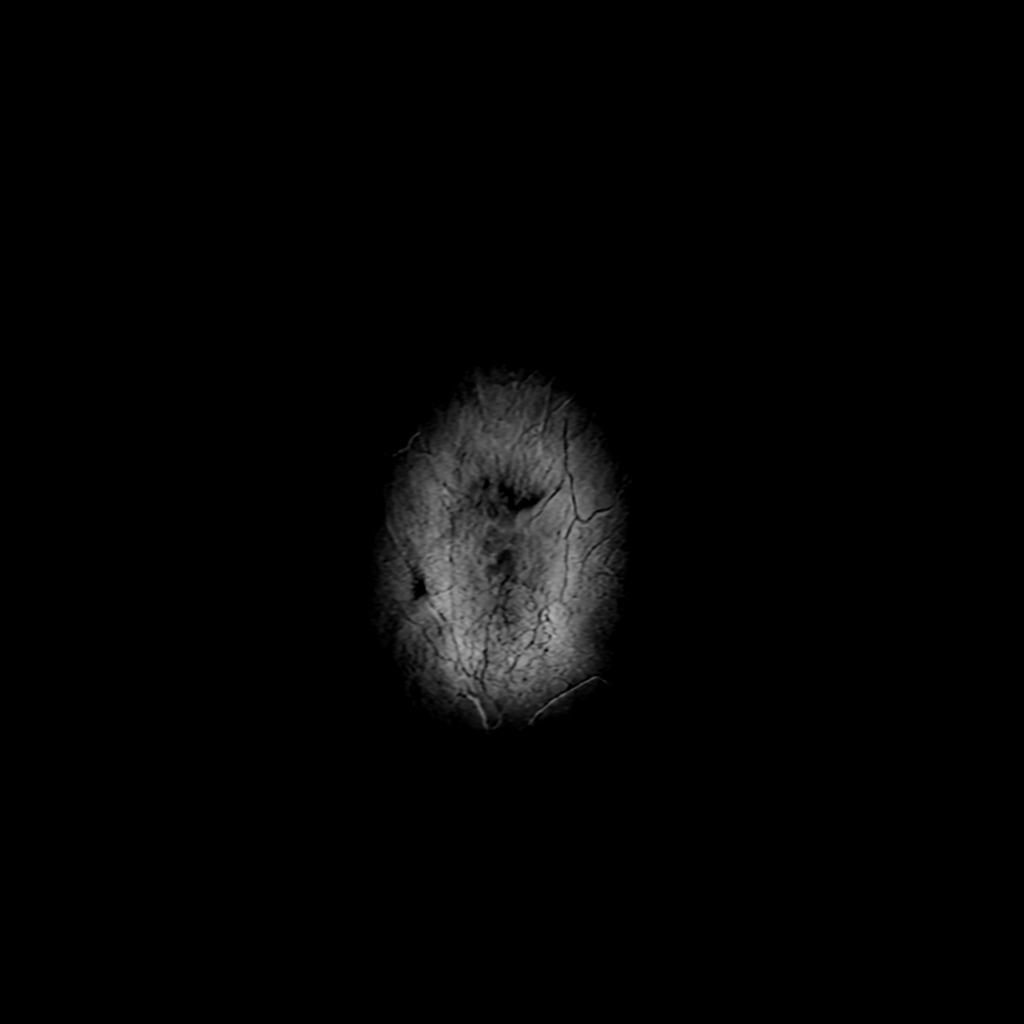

[Series 6: FLAIR · axial · 4.0mm · 0.45mm/px · z∈[-78,+71]mm · 3 of 35 slices shown (2 of 2)]
[im 1/35]
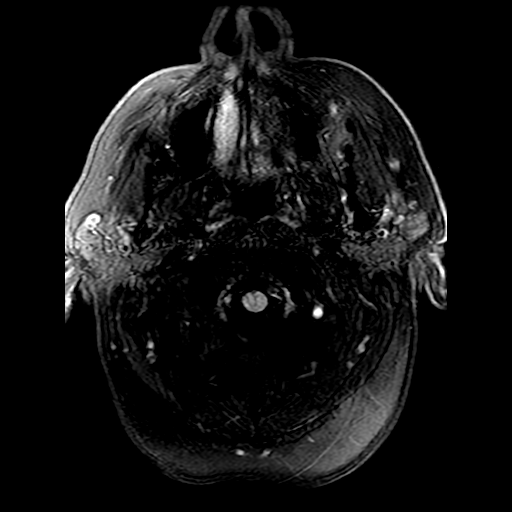
[im 18/35]
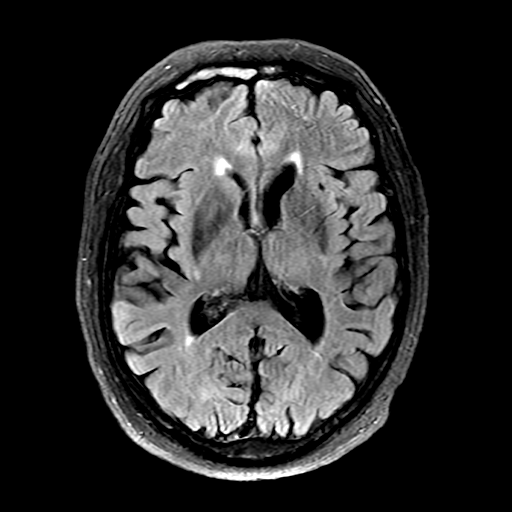
[im 35/35]
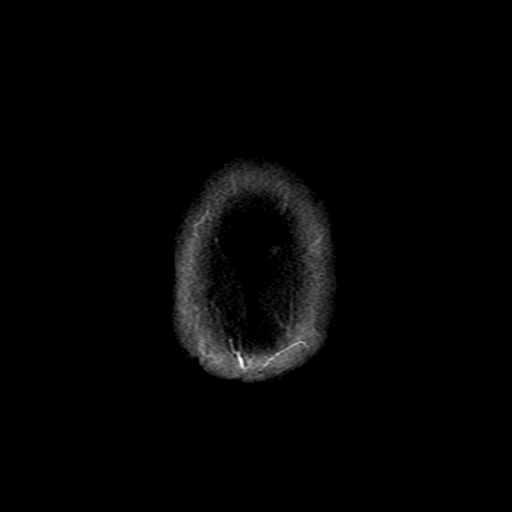

[Series 9: T2 · coronal · 5.0mm · 0.39mm/px · 1 of 31 slices shown (2 of 2)]
[im 1/31]
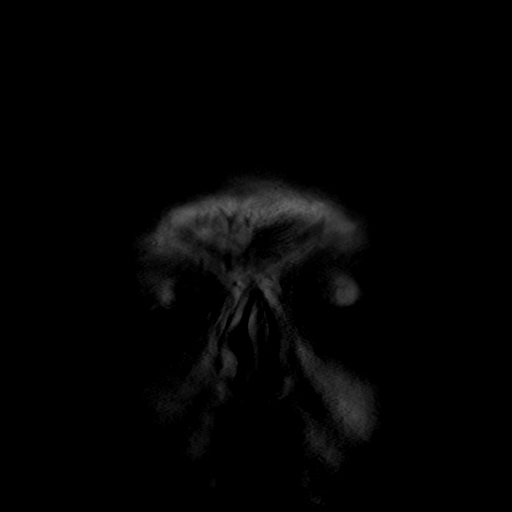

[Series 250: ADC · axial · 3.0mm · 0.94mm/px · z∈[-84,+74]mm · 4 of 54 slices shown (1 of 2)]
[im 1/54]
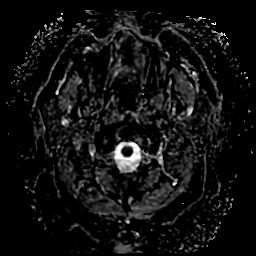
[im 18/54]
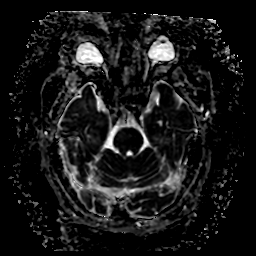
[im 36/54]
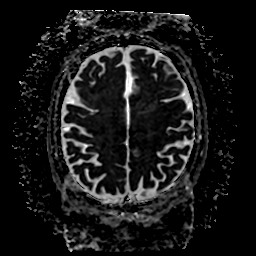
[im 54/54]
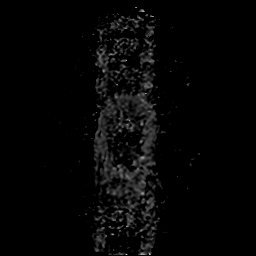

[Series 350: ADC · coronal · 4.0mm · 0.94mm/px · 3 of 36 slices shown (2 of 2)]
[im 1/36]
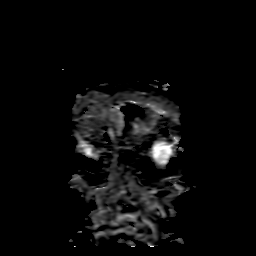
[im 18/36]
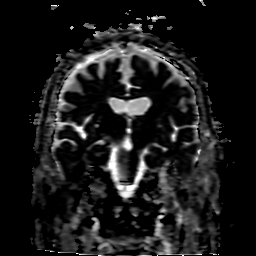
[im 36/36]
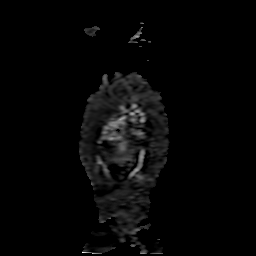

[27 of 48 positions shown; findings below may reference images not displayed]

FINDINGS: Brain: There are small acute cortical and subcortical infarcts
involving the right frontal and parietal lobes in the perirolandic
region. A punctate focus of susceptibility artifact in the right
frontoparietal region corresponds to a calcification on CT. No
intracranial hemorrhage, mass, midline shift, or extra-axial fluid
collection is identified. T2 hyperintensities elsewhere in the
cerebral white matter bilaterally are nonspecific but compatible
with mild chronic small vessel ischemic disease. There are chronic
lacunar infarcts in the right corona radiata and caudate body. There
is mild-to-moderate cerebral atrophy.

Vascular: Major intracranial vascular flow voids are preserved.

Skull and upper cervical spine: Unremarkable bone marrow signal.

Sinuses/Orbits: Right cataract extraction. Complete opacification of
the right frontal sinus. No significant mastoid fluid.

Other: None.
IMPRESSION: 1. Small acute right frontal and parietal infarcts.
2. Mild chronic small vessel ischemic disease and cerebral atrophy.

## 2021-05-27 IMAGING — CT CT HEAD W/O CM
4 series · 17 of 47 positions shown, 19 images · non-contrast
Comparison: Head CT [DATE].

CLINICAL DATA: 65-year-old male with history of left arm weakness.
Possible cerebrovascular accident.

EXAM:
CT HEAD WITHOUT CONTRAST
TECHNIQUE: Contiguous axial images were obtained from the base of the skull
through the vertex without intravenous contrast.

[Series 3: head without · axial · non-contrast · 0.46mm/px · z∈[+1304,+1424]mm · 7 of 32 slices shown, 9 images]
[im 4/32  brain]
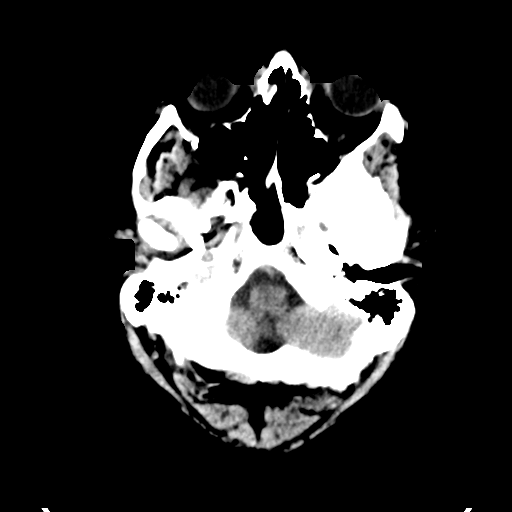
[im 4/32  bone]
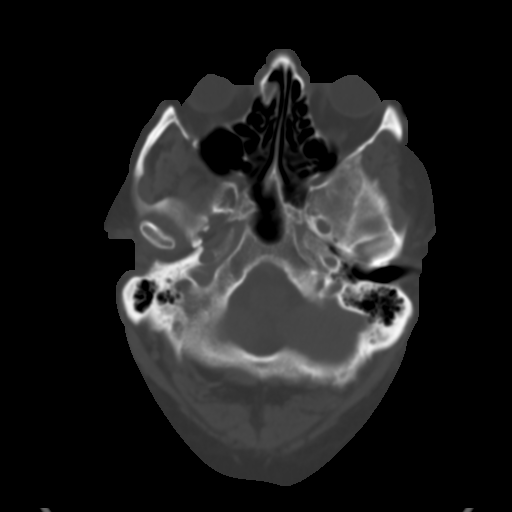
[im 8/32  brain]
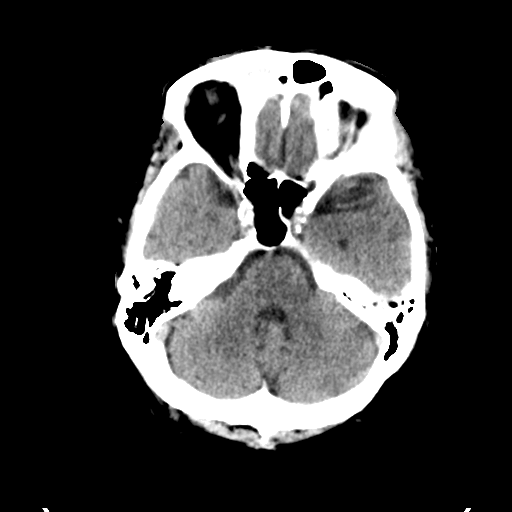
[im 12/32  brain]
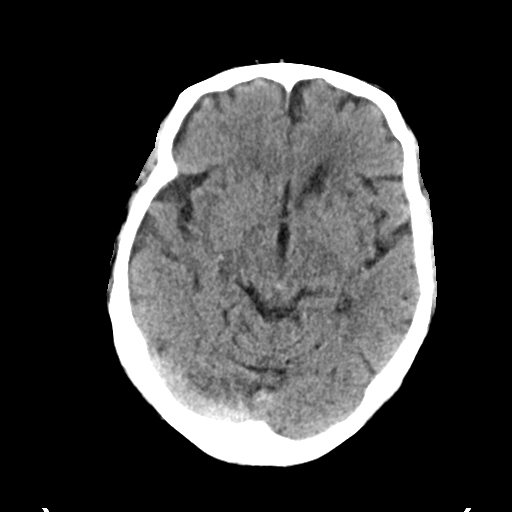
[im 16/32  brain]
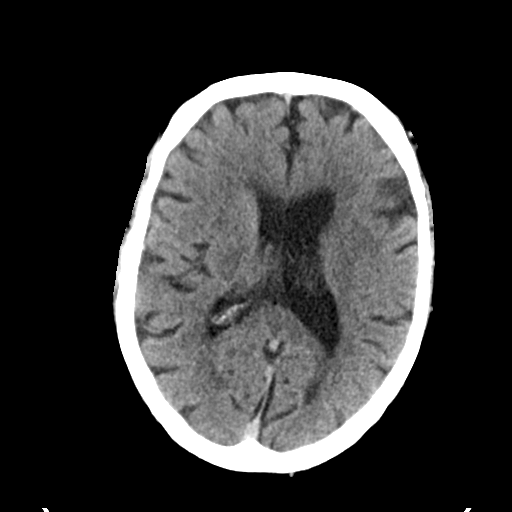
[im 20/32  brain]
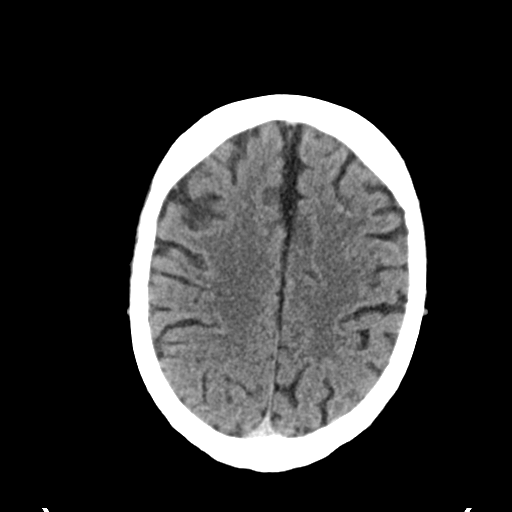
[im 20/32  bone]
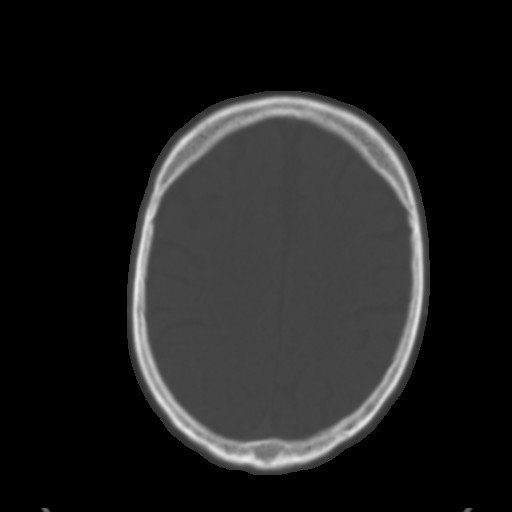
[im 24/32  brain]
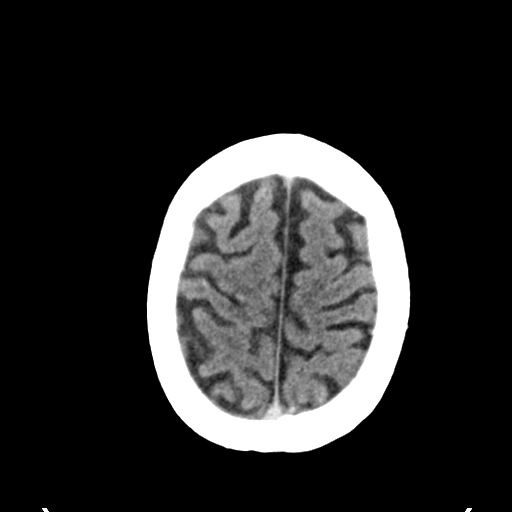
[im 28/32  brain]
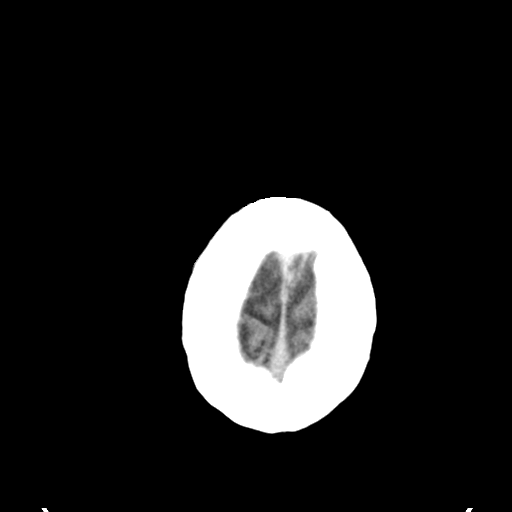

[Series 4: head bone · axial · 0.46mm/px · z∈[+1303,+1359]mm · 4 of 79 slices shown]
[im 8/79  bone]
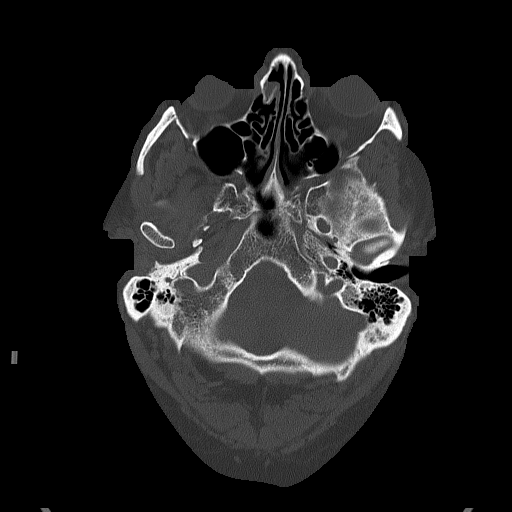
[im 16/79  bone]
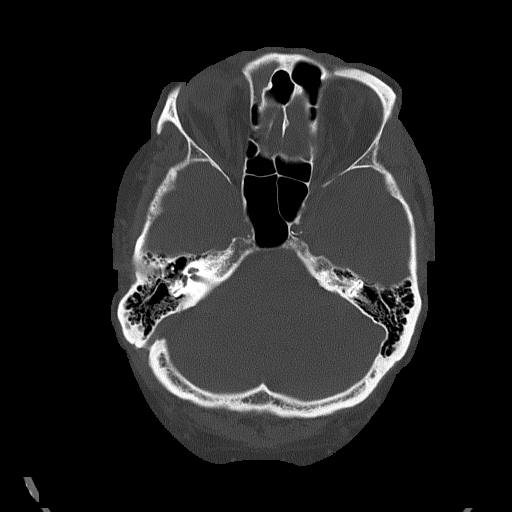
[im 24/79  bone]
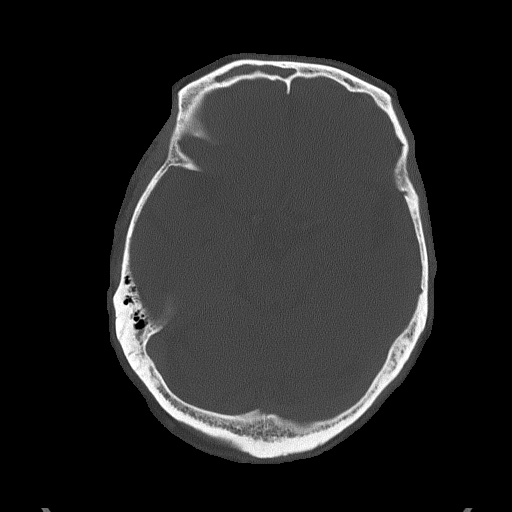
[im 36/79  bone]
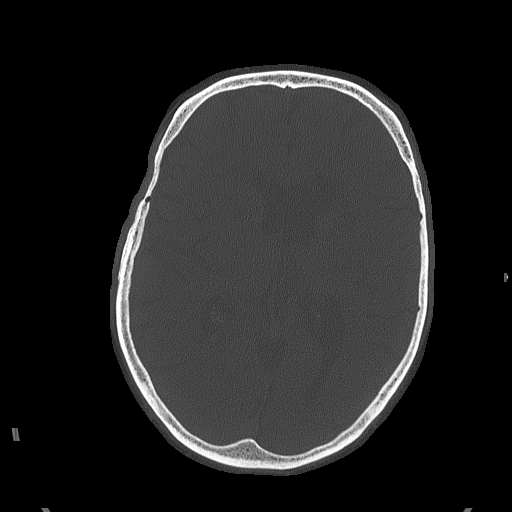

[Series 5: head without cor · coronal · non-contrast · 0.34mm/px · 3 of 69 slices shown]
[im 23/69  brain]
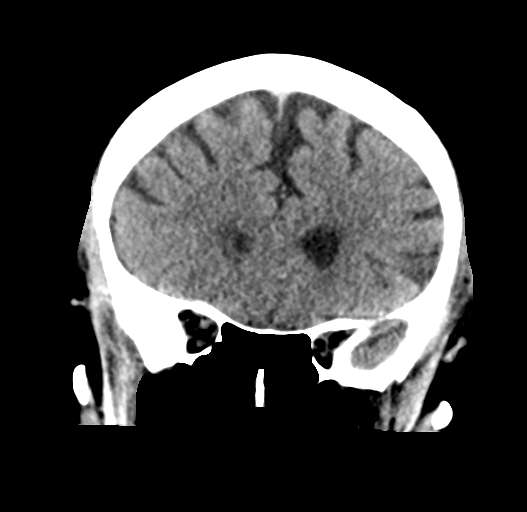
[im 31/69  brain]
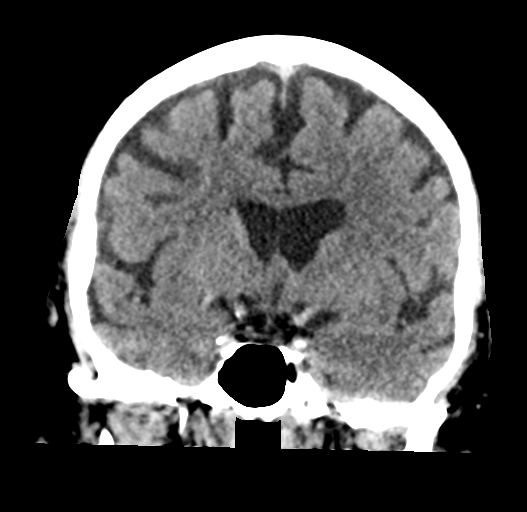
[im 38/69  brain]
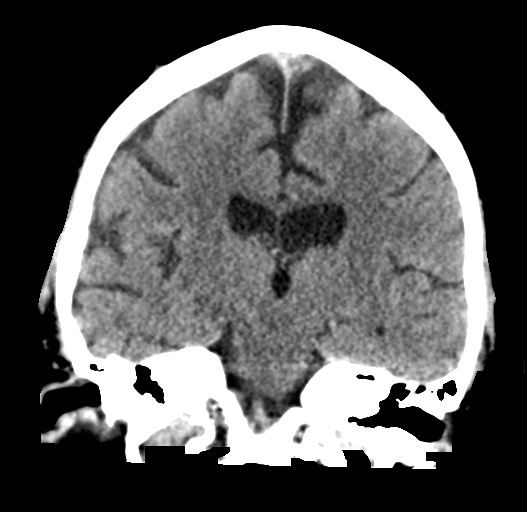

[Series 6: head without sag · sagittal · non-contrast · 0.40mm/px · 3 of 51 slices shown]
[im 17/51  brain]
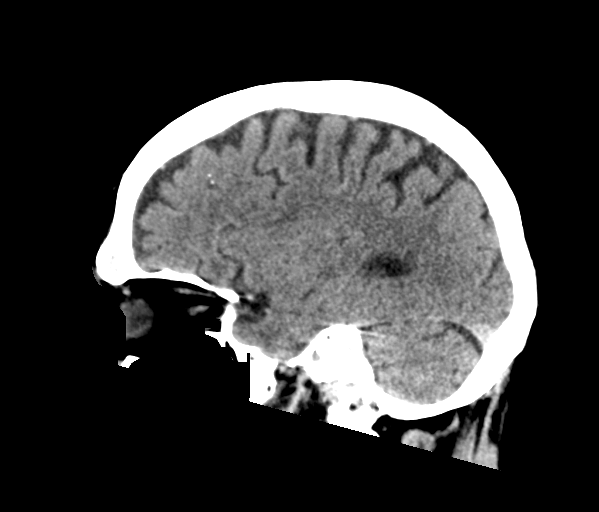
[im 26/51  brain]
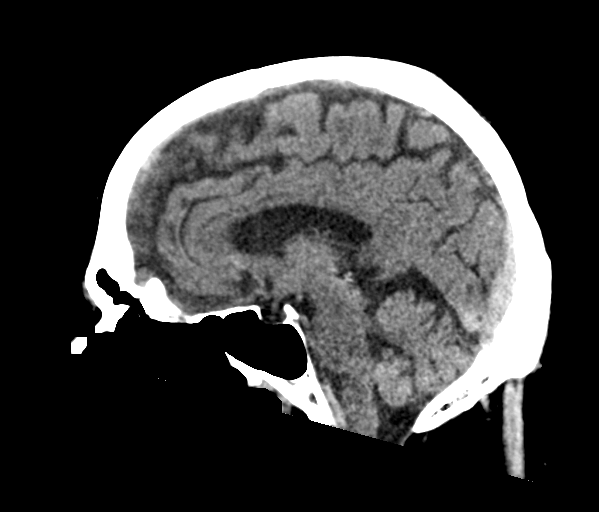
[im 34/51  brain]
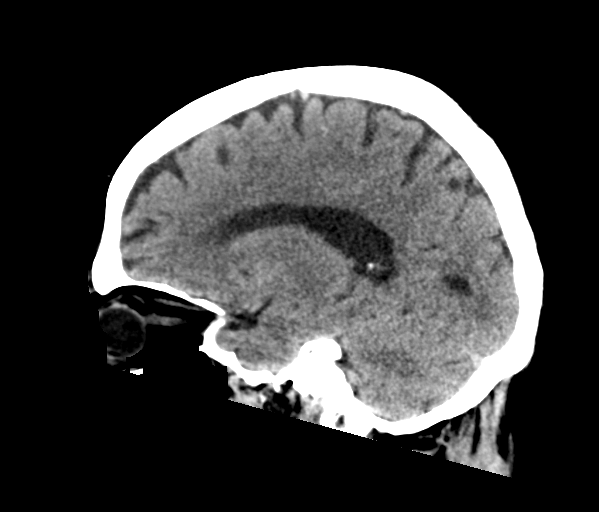

[17 of 47 positions shown; findings below may reference images not displayed]

FINDINGS: Brain: Patchy and confluent areas of decreased attenuation are noted
throughout the deep and periventricular white matter of the cerebral
hemispheres bilaterally, compatible with chronic microvascular
ischemic disease. Tiny cortical calcification in the posterior right
frontal region (axial image 22 of series 3), new compared to the
prior study, likely sequela of prior infection. No evidence of acute
infarction, hemorrhage, hydrocephalus, extra-axial collection or
mass lesion/mass effect.

Vascular: No hyperdense vessel or unexpected calcification.

Skull: Normal. Negative for fracture or focal lesion.

Sinuses/Orbits: No acute finding.

Other: None.
IMPRESSION: 1. No acute intracranial abnormalities.
2. Chronic microvascular ischemic changes in the cerebral white
matter, as above.

## 2021-05-27 MED ORDER — IOHEXOL 350 MG/ML SOLN
80.0000 mL | Freq: Once | INTRAVENOUS | Status: AC | PRN
  Administered 2021-05-27: 80 mL via INTRAVENOUS

## 2021-05-27 MED ORDER — ASPIRIN 81 MG PO CHEW
324.0000 mg | CHEWABLE_TABLET | Freq: Once | ORAL | Status: DC
  Filled 2021-05-27: qty 4

## 2021-05-27 MED ORDER — ACETAMINOPHEN 325 MG PO TABS: 650.0000 mg | ORAL_TABLET | Freq: Four times a day (QID) | ORAL | Status: DC | PRN

## 2021-05-27 MED ORDER — SODIUM CHLORIDE 0.9% FLUSH
3.0000 mL | Freq: Two times a day (BID) | INTRAVENOUS | Status: DC
  Administered 2021-05-28: 3 mL via INTRAVENOUS

## 2021-05-27 MED ORDER — ONDANSETRON HCL 4 MG/2ML IJ SOLN: 4.0000 mg | Freq: Four times a day (QID) | INTRAMUSCULAR | Status: DC | PRN

## 2021-05-27 MED ORDER — ASPIRIN EC 81 MG PO TBEC
81.0000 mg | DELAYED_RELEASE_TABLET | Freq: Every day | ORAL | Status: DC
  Administered 2021-05-28: 81 mg via ORAL
  Filled 2021-05-27: qty 1

## 2021-05-27 MED ORDER — SODIUM CHLORIDE 0.9% FLUSH: 3.0000 mL | Freq: Once | INTRAVENOUS | Status: DC

## 2021-05-27 MED ORDER — METOPROLOL SUCCINATE ER 25 MG PO TB24
50.0000 mg | ORAL_TABLET | Freq: Once | ORAL | Status: DC
  Filled 2021-05-27: qty 2

## 2021-05-27 MED ORDER — ATORVASTATIN CALCIUM 80 MG PO TABS
80.0000 mg | ORAL_TABLET | Freq: Every day | ORAL | Status: DC
  Administered 2021-05-27 – 2021-05-28 (×2): 80 mg via ORAL
  Filled 2021-05-27: qty 2
  Filled 2021-05-27: qty 1

## 2021-05-27 MED ORDER — POLYETHYLENE GLYCOL 3350 17 G PO PACK: 17.0000 g | PACK | Freq: Every day | ORAL | Status: DC | PRN

## 2021-05-27 MED ORDER — LORAZEPAM 2 MG/ML IJ SOLN
1.0000 mg | Freq: Once | INTRAMUSCULAR | Status: AC | PRN
  Administered 2021-05-27: 1 mg via INTRAVENOUS
  Filled 2021-05-27: qty 1

## 2021-05-27 MED ORDER — LATANOPROST 0.005 % OP SOLN
1.0000 [drp] | Freq: Every day | OPHTHALMIC | Status: DC
  Administered 2021-05-27: 1 [drp] via OPHTHALMIC
  Filled 2021-05-27: qty 2.5

## 2021-05-27 MED ORDER — ONDANSETRON HCL 4 MG PO TABS: 4.0000 mg | ORAL_TABLET | Freq: Four times a day (QID) | ORAL | Status: DC | PRN

## 2021-05-27 MED ORDER — BRINZOLAMIDE 1 % OP SUSP
1.0000 [drp] | Freq: Two times a day (BID) | OPHTHALMIC | Status: DC
  Administered 2021-05-27 – 2021-05-28 (×2): 1 [drp] via OPHTHALMIC
  Filled 2021-05-27 (×2): qty 10

## 2021-05-27 MED ORDER — PERFLUTREN LIPID MICROSPHERE
1.0000 mL | INTRAVENOUS | Status: AC | PRN
  Administered 2021-05-27: 3 mL via INTRAVENOUS
  Filled 2021-05-27: qty 10

## 2021-05-27 MED ORDER — ACETAMINOPHEN 650 MG RE SUPP: 650.0000 mg | Freq: Four times a day (QID) | RECTAL | Status: DC | PRN

## 2021-05-27 MED ORDER — ENOXAPARIN SODIUM 40 MG/0.4ML IJ SOSY
40.0000 mg | PREFILLED_SYRINGE | INTRAMUSCULAR | Status: DC
  Administered 2021-05-27 – 2021-05-28 (×2): 40 mg via SUBCUTANEOUS
  Filled 2021-05-27 (×3): qty 0.4

## 2021-05-27 MED ORDER — CYCLOSPORINE 0.05 % OP EMUL
1.0000 [drp] | Freq: Two times a day (BID) | OPHTHALMIC | Status: DC
  Filled 2021-05-27 (×3): qty 1

## 2021-05-27 NOTE — ED Provider Notes (Signed)
[facility] EMERGENCY DEPARTMENT Provider Note   CSN: [id number] Arrival date & time: 05/27/21  2595     History Chief Complaint  Patient presents with   Weakness    Tyler Burns is a 65 y.o. male.  HPI Patient went to bed well last night at 10 PM.  He has not been ill or had any other symptoms.  He reports he awakened at 6 AM this morning with his left arm limp and numb.  He reports he could not use it at all.  He also has numbness on the left side of his cheek and left side of his neck.  He reports that the function in the arm has improved since it first started.  He reports now he is able to move it some.  Still remains weak and slightly incoordinated.  However, it is much better than it was upon awakening this morning.  No history of similar symptoms.  No associated weakness numbness or tingling of the legs.  No headache and no visual change.  Patient does have history of atrial fibrillation.  He is not anticoagulated.  He does take a daily aspirin.  He has history of AAA repair with vascular graft by Dr. Arbie Cookey in 2020.    Past Medical History:  Diagnosis Date   Asthma    Dysrhythmia    Afib   Glaucoma    High cholesterol    History of cardiovascular stress test    Myoview 12/19:  EF 50%, No ischemia, Low Risk   Hypertension     Patient Active Problem List   Diagnosis Date Noted   AAA (abdominal aortic aneurysm) (HCC) 05/25/2019   CKD (chronic kidney disease) stage 3, GFR 30-59 ml/min (HCC) 07/09/2015   Atrial fibrillation (HCC) 09/20/2013    Past Surgical History:  Procedure Laterality Date   ABDOMINAL AORTIC ANEURYSM REPAIR N/A 05/25/2019   Procedure: ANEURYSM ABDOMINAL AORTIC REPAIR using a HEMASHIELD GOLD VASCULAR GRAFT;  Surgeon: Larina Earthly, MD;  Location: [facility] OR;  Service: Vascular;  Laterality: N/A;   APPENDECTOMY     EYE SURGERY Right    cataract   INGUINAL HERNIA REPAIR Right 06/15/2015   Procedure: RIGHT INGUINAL HERNIA REPAIR WITH MESH;   Surgeon: Avel Peace, MD;  Location: [facility] OR;  Service: General;  Laterality: Right;   INSERTION OF MESH Right 06/15/2015   Procedure: INSERTION OF MESH;  Surgeon: Avel Peace, MD;  Location: [facility] OR;  Service: General;  Laterality: Right;       Family History  Problem Relation Age of Onset   Congestive Heart Failure Mother    Heart attack Mother 39   Heart attack Brother 62   Leukemia Brother    Heart failure Sister 9   Stroke Neg Hx     Social History   Tobacco Use   Smoking status: Former    Types: Cigarettes    Quit date: 1970    Years since quitting: 52.6   Smokeless tobacco: Never  Vaping Use   Vaping Use: Never used  Substance Use Topics   Alcohol use: No   Drug use: No    Home Medications Prior to Admission medications   Medication Sig Start Date End Date Taking? Authorizing Provider  aspirin EC 81 MG tablet Take 1 tablet (81 mg total) by mouth daily. 09/30/18  Yes Corky Crafts, MD  bimatoprost (LUMIGAN) 0.01 % SOLN Place 1 drop into the left eye at bedtime.    Yes [provider]  brinzolamide (AZOPT) 1 % ophthalmic suspension Place 1 drop into both eyes 2 (two) times a day.   Yes [provider]  lovastatin (MEVACOR) 40 MG tablet Take 60 mg by mouth daily.  09/28/18  Yes [provider]  metoprolol succinate (TOPROL-XL) 50 MG 24 hr tablet Take 50 mg by mouth daily. Take with or immediately following a meal.   Yes [provider]  Omega-3 Fatty Acids (FISH OIL) 600 MG CAPS Take 600 mg by mouth daily.    Yes [provider]  RESTASIS 0.05 % ophthalmic emulsion Place 1 drop into both eyes every 12 (twelve) hours. 10/18/19  Yes [provider]  cetirizine (ZYRTEC) 10 MG tablet Take 1 tablet (10 mg total) by mouth daily. One tab daily for allergies Patient not taking: Reported on 05/27/2021 08/07/12   Linna Hoff, MD  furosemide (LASIX) 40 MG tablet Take 1 tablet (40 mg total) by mouth daily. Patient  not taking: Reported on 05/27/2021 10/17/13   Corky Crafts, MD  HYDROcodone-acetaminophen (NORCO/VICODIN) 5-325 MG tablet Take 1 tablet by mouth 4 (four) times daily.  Patient not taking: Reported on 05/27/2021    [provider]  Turmeric Curcumin 500 MG CAPS Take 500 mg by mouth daily. Patient not taking: Reported on 05/27/2021    [provider]  vitamin E 400 UNIT capsule Take 400 Units by mouth daily.  Patient not taking: Reported on 05/27/2021    [provider]    Allergies    Brinzolamide-brimonidine  Review of Systems   Review of Systems 10 systems reviewed and negative except as per HPI Physical Exam Updated Vital Signs BP (!) 169/112   Pulse 79   Temp 97.6 F (36.4 C) (Oral)   Resp (!) 30   Ht 6' (1.829 m)   Wt 107 kg   SpO2 97%   BMI 32.01 kg/m   Physical Exam Constitutional:      Comments: Alert nontoxic mental status clear.  No respiratory distress.  HENT:     Mouth/Throat:     Pharynx: Oropharynx is clear.  Eyes:     Extraocular Movements: Extraocular movements intact.  Cardiovascular:     Comments: Irregularly irregular.  No rub murmur gallop Pulmonary:     Effort: Pulmonary effort is normal.     Breath sounds: Normal breath sounds.  Abdominal:     General: There is no distension.     Palpations: Abdomen is soft.     Tenderness: There is no abdominal tenderness. There is no guarding.  Musculoskeletal:     Cervical back: Neck supple.     Comments: No significant peripheral edema.  Feet are in good condition lower leg symmetric.  Skin:    General: Skin is warm and dry.  Neurological:     Comments: No receptive or expressive aphasia.  Speech is clear.  Cognitive function intact.  Cranial nerves II through XII intact.  Grip strength on left 3\5, grip strength right 5\5.  Positive mild pronator drift on the left.  Lower extremities, bilateral 5\5 strength testing.  Finger-nose exam normal on the right with some incoordination on  the left.  Sensation intact to light touch with decreased perception on the left arm.    ED Results / Procedures / Treatments   Labs (all labs ordered are listed, but only abnormal results are displayed) Labs Reviewed  COMPREHENSIVE METABOLIC PANEL - Abnormal; Notable for the following components:      Result Value  Glucose, Bld 127 (*)    All other components within normal limits  I-STAT CHEM 8, ED - Abnormal; Notable for the following components:   Glucose, Bld 122 (*)    All other components within normal limits  PROTIME-INR  APTT  CBC  DIFFERENTIAL  CBG MONITORING, ED    EKG EKG Interpretation  Date/Time:  Monday May 27 2021 08:13:45 EDT Ventricular Rate:  84 PR Interval:    QRS Duration: 100 QT Interval:  381 QTC Calculation: 420 R Axis:   25 Text Interpretation: Atrial fibrillation Low voltage, precordial leads agree, no sig change from previous Confirmed by Arby Barrette [phone]) on 05/27/2021 8:55:52 AM  Radiology CT Head Wo Contrast  Result Date: 05/27/2021 CLINICAL DATA:  65 year old male with history of left arm weakness. Possible cerebrovascular accident. EXAM: CT HEAD WITHOUT CONTRAST TECHNIQUE: Contiguous axial images were obtained from the base of the skull through the vertex without intravenous contrast. COMPARISON:  Head CT 04/16/2010. FINDINGS: Brain: Patchy and confluent areas of decreased attenuation are noted throughout the deep and periventricular white matter of the cerebral hemispheres bilaterally, compatible with chronic microvascular ischemic disease. Tiny cortical calcification in the posterior right frontal region (axial image 22 of series 3), new compared to the prior study, likely sequela of prior infection. No evidence of acute infarction, hemorrhage, hydrocephalus, extra-axial collection or mass lesion/mass effect. Vascular: No hyperdense vessel or unexpected calcification. Skull: Normal. Negative for fracture or focal lesion. Sinuses/Orbits: No  acute finding. Other: None. IMPRESSION: 1. No acute intracranial abnormalities. 2. Chronic microvascular ischemic changes in the cerebral white matter, as above. Electronically Signed   By: Trudie Reed M.D.   On: 05/27/2021 10:31    Procedures Procedures   Medications Ordered in ED Medications  sodium chloride flush (NS) 0.9 % injection 3 mL (3 mLs Intravenous Not Given 05/27/21 2409)    ED Course  I have reviewed the triage vital signs and the nursing notes.  Pertinent labs & imaging results that were available during my care of the patient were reviewed by me and considered in my medical decision making (see chart for details).    MDM Rules/Calculators/A&P                           Patient presents with last known well at 10 PM.  Awakening with a flaccid paralysis of the left arm at 6 AM.  Symptoms have been improving.  He does have residual weakness and incoordination but has been improving and has function present.  Patient has known history of atrial fibrillation on aspirin without other anticoagulant.  High suspicion for CVA.  We will proceed with stroke work-up and neurology consultation.  Consult: Hospitalist for admission. Final Clinical Impression(s) / ED Diagnoses Final diagnoses:  Weakness of left upper extremity    Rx / DC Orders ED Discharge Orders     None        Arby Barrette, MD 05/27/21 1245

## 2021-05-27 NOTE — ED Notes (Addendum)
Pt will need ativan prior to going to MRI. Obtained PRN order for medication. MRI notified.

## 2021-05-27 NOTE — ED Notes (Signed)
Pt transported to CT ?

## 2021-05-27 NOTE — ED Notes (Addendum)
Echo at [facility], MRI ready for pt, will be ready in ~20 minutes.

## 2021-05-27 NOTE — ED Notes (Signed)
Pt alert, NAD, calm, interactive, resps e/u, speaking in clear complete sentences, sitting on edge of bed and states, "doing OK".

## 2021-05-27 NOTE — ED Notes (Signed)
Back from MRI, alert, NAD, calm, no changes.

## 2021-05-27 NOTE — ED Triage Notes (Signed)
Patient states he went to bed at 10pm last pm and was totally normally got up to go tho the bathroom and noticed left arm was weak. Denies visual changes. Left arm is weak with weak grip positive pulse.

## 2021-05-27 NOTE — H&P (Signed)
Date: 05/27/2021               Patient Name:  Tyler Burns MRN: [id number]  DOB: [birth year] Age / Sex: 65 y.o., male   PCP: Quitman Livings, MD         Medical Service: Internal Medicine Teaching Service         Attending Physician: Dr. Oswaldo Done, Marquita Palms, *    First Contact: Dr. Allena Katz Pager: [phone]  Second Contact: Dr. Mcarthur Rossetti Pager: [phone]       After Hours (After 5p/  First Contact Pager: [phone]  weekends / holidays): Second Contact Pager: [phone]   Chief Complaint: Left arm weakness and numbness  History of Present Illness:   Tyler Burns is a 65 year old gentleman with a past medical history of hypertension, hyperlipidemia, AAA s/p repair in 2020 who presents today with left arm weakness and numbness.  Tyler Burns states that he went to bed around 10 PM on 7/31 in his normal state of health.  On 8/1 at 5:30 AM, he awoke with left arm flaccid weakness and numbness.  He notes that the numbness extended into his neck and left face.  He denies any lower extremity symptoms.  He called EMS.  En route, Tyler Burns began to have some movement and feeling return to his left arm.  At this time, he feels that his left arm weakness is nearly resolved.  He has some residual numbness on the left side of his neck, but denies any numbness in his arm any longer.  He denies any facial droop/weakness, dysarthria, dysphagia, vision changes or confusion at any point.  Tyler Burns notes a history of atrial fibrillation for which he is not on anticoagulation due to personal choice.  He recalls that his mother was on Coumadin and he was very alarmed about how limited her life was secondary to the medication.  He was not aware that there are any other anticoagulants that he could take.  He is on daily aspirin and metoprolol.  For his history of hyperlipidemia he is on lovastatin.  He denies any other daily medications except eyedrops for his glaucoma.  ED course: On arrival to the ED, patient was  hypertensive at 160/112 with a heart rate of 79.  He is afebrile at 97.6 Fahrenheit with respiratory rate of 16 and oxygen saturation 95%.  Initial CBC was unremarkable.  PT/INR is unremarkable.  BMP was notable for a mildly elevated glucose at 122.  CT head without contrast was obtained that did not show any acute intracranial abnormalities.  Neurology was consulted for stroke evaluation; CT head and neck with contrast were ordered.  IMTS was consulted for admission.  Meds:  Current Meds  Medication Sig   aspirin EC 81 MG tablet Take 1 tablet (81 mg total) by mouth daily.   bimatoprost (LUMIGAN) 0.01 % SOLN Place 1 drop into the left eye at bedtime.    brinzolamide (AZOPT) 1 % ophthalmic suspension Place 1 drop into both eyes 2 (two) times a day.   lovastatin (MEVACOR) 40 MG tablet Take 60 mg by mouth daily.    metoprolol succinate (TOPROL-XL) 50 MG 24 hr tablet Take 50 mg by mouth daily. Take with or immediately following a meal.   Omega-3 Fatty Acids (FISH OIL) 600 MG CAPS Take 600 mg by mouth daily.    RESTASIS 0.05 % ophthalmic emulsion Place 1 drop into both eyes every 12 (twelve) hours.   Allergies: Allergies as of  05/27/2021 - Review Complete 05/27/2021  Allergen Reaction Noted   Brinzolamide-brimonidine Shortness Of Breath 05/02/2015   Past Medical History:  Diagnosis Date   Asthma    Dysrhythmia    Afib   Glaucoma    High cholesterol    History of cardiovascular stress test    Myoview 12/19:  EF 50%, No ischemia, Low Risk   Hypertension    Family History:  Mother: Atrial fibrillation, MI, CHF Brother: MI Brother: Leukemia Sister: CHF  No family history of stroke.   Social History:  Tyler Burns lives in [place] with his wife Retired after a back injury at work approximately 12 years ago Denies any tobacco use or former history of tobacco use Only drinks alcohol on special occasions Denies any drug use  Review of Systems: A complete ROS was negative except as per  HPI.   Physical Exam: Blood pressure (!) 180/121, pulse 77, temperature 97.6 F (36.4 C), temperature source Oral, resp. rate 16, height 6' (1.829 m), weight 107 kg, SpO2 97 %.  Physical Exam Vitals and nursing note reviewed.  Constitutional:      General: He is not in acute distress.    Appearance: He is obese.  HENT:     Head: Normocephalic and atraumatic.     Mouth/Throat:     Mouth: Mucous membranes are moist.     Dentition: Abnormal dentition.  Eyes:     Extraocular Movements: Extraocular movements intact.     Conjunctiva/sclera: Conjunctivae normal.     Pupils: Pupils are equal, round, and reactive to light.  Cardiovascular:     Rate and Rhythm: Normal rate. Rhythm irregularly irregular.     Pulses:          Radial pulses are 2+ on the right side and 2+ on the left side.     Heart sounds: No murmur heard. Pulmonary:     Effort: Pulmonary effort is normal. No respiratory distress.     Breath sounds: Normal breath sounds. No wheezing, rhonchi or rales.  Abdominal:     General: Bowel sounds are normal.     Palpations: Abdomen is soft.  Musculoskeletal:     Right lower leg: No edema.     Left lower leg: No edema.  Skin:    General: Skin is cool and dry.     Findings: No lesion or rash.  Neurological:     Mental Status: He is alert and oriented to person, place, and time.     Cranial Nerves: No dysarthria or facial asymmetry.     Sensory: Sensory deficit present.     Motor: No weakness or tremor.     Coordination: Finger-Nose-Finger Test normal.     Comments:  3/5 strength with left shrug, 5/5 on right.  No upper extremity weakness, distal or peripherally.  Decreased sensation on left face compared to right  Psychiatric:        Mood and Affect: Mood normal.        Behavior: Behavior normal.        Thought Content: Thought content normal.        Judgment: Judgment normal.   EKG: personally reviewed my interpretation is: atrial fibrillation without RVR. No ST or T  wave changes to suggest ischemia.   Assessment & Plan by Problem: Active Problems:   Acute CVA (cerebrovascular accident) [facility])  # Acute CVA: Tyler Burns is presenting with left upper extremity and facial numbness as well as left upper extremity flaccid weakness; majority of  patient's symptoms have already resolved.  Initial CT is negative for infarct.  CTA of head and neck did demonstrate significant right carotid artery stenosis.  Etiology of CVA at this time includes embolic secondary to atrial fibrillation versus carotid artery stenosis.  Will obtain MRI for further evaluation, in addition to echocardiogram to evaluate for thrombus. Plan to also obtain hemoglobin A1c and lipid panel for risk management.  - Neurology following; Appreciate their recommendations  - MRI w/o contrast  - TTE  - Hgb A1c  - Lipid panel  - Continue Aspirin 81 mg daily - Start Atorvastatin 80 mg daily  - Permissive HTN up to 220/110 for the next 24 hours  - PT/OT consulted   # Carotid Artery Stenosis, Right CTA of neck remarkable for a soft plaque of the right carotid artery at the bifurcation, involving the ICA.  There is >80% stenosis of the distal ICA.  Awaiting MRI results to identify if stenosis was responsible for patient's acute CVA.  He will likely benefit from vascular intervention, however until MRI results, urgency of intervention is undetermined.   - Will need inpatient vs outpatient follow up with Vascular Surgery - Start Atorvastatin 80 mg daily   # Permanent Atrial Fibrillation  Patient has a history of permanent atrial fibrillation dating back to 2014.  CHA2DS2-VASc score was previously 2 for age and hypertension, however is now 4 given CVA.  We discussed the benefits of anticoagulation prior to or on discharge with either Xarelto or Eliquis; patient is in agreement.    - Will need to consult TOC for Eliquis vs Xarelto cost - Hold off on anticoagulation pending MRI results - Holding Metoprolol  for permissive HTN. Patient is rate controlled at this time.   # Hypertension  Only on Metoprolol.  - Holding Metoprolol for permissive HTN  # Hyperlipidemia  On Lovastatin only at this time. No previous lipid panel available in chart.  Given carotid artery stenosis, will start high intensity statin at this time  - Lipid panel  - Start Atorvastatin 80 mg daily   Diet: Heart Healthy VTE: Enoxaparin IVF: None,None Code: Full  Prior to Admission Living Arrangement: Home, living with his wife Anticipated Discharge Location: Home Barriers to Discharge: Continued medical evaluation, including MRI and TTE  Dispo: Admit patient to Observation with expected length of stay less than 2 midnights.  Signed: Dr. Verdene Lennert Internal Medicine PGY-3 Pager: [phone] After 5pm on weekdays and 1pm on weekends: On Call pager [phone]  05/27/2021, 3:34 PM

## 2021-05-27 NOTE — Consult Note (Signed)
NEUROLOGY CONSULTATION NOTE   Date of service: May 27, 2021 Patient Name: Tyler Burns MRN:  [id number] DOB:  July 04, 1956 Reason for consult: "L arm weakness" Requesting Provider: Arby Barrette, MD _ _ _   _ __   _ __ _ _  __ __   _ __   __ _  History of Present Illness  Tyler Burns is a 65 y.o. male with PMH significant for asthma, glaucoma, HLD, AAA s/p repair in 2020, Afibb on aspirin, HTN who presents with L face numbness and L arm weakness.  He went to bed on 2200 on 05/26/2021 and woke up in the morning at 0530 on 05/27/2021 with left arm dangling by his side.  Left arm improved with the course of a couple hours and so he came into the emergency department for further work-up and evaluation.  No prior history of strokes.  No family history of strokes. He does not smoke, drinks socially, uses marijuana.  Endorses history of atrial fibrillation but is only on aspirin for that.  He also reports history of abdominal aortic aneurysm and repair in 2020.  mRS: 0 NIHSS components Score: Comment  1a Level of Conscious 0[x]  1[]  2[]  3[]      1b LOC Questions 0[x]  1[]  2[]       1c LOC Commands 0[x]  1[]  2[]       2 Best Gaze 0[x]  1[]  2[]       3 Visual 0[x]  1[]  2[]  3[]      4 Facial Palsy 0[x]  1[]  2[]  3[]      5a Motor Arm - left 0[x]  1[]  2[]  3[]  4[]  UN[]    5b Motor Arm - Right 0[x]  1[]  2[]  3[]  4[]  UN[]    6a Motor Leg - Left 0[x]  1[]  2[]  3[]  4[]  UN[]    6b Motor Leg - Right 0[x]  1[]  2[]  3[]  4[]  UN[]    7 Limb Ataxia 0[x]  1[]  2[]  3[]  UN[]     8 Sensory 0[x]  1[]  2[]  UN[]      9 Best Language 0[x]  1[]  2[]  3[]      10 Dysarthria 0[x]  1[]  2[]  UN[]      11 Extinct. and Inattention 0[x]  1[]  2[]       TOTAL: 0   tPA: outside window Thrombectomy: Low NIHSS.    ROS   Constitutional Denies weight loss, fever and chills.   HEENT Denies changes in vision and hearing.   Respiratory Denies SOB and cough.   CV Denies palpitations and CP   GI Denies abdominal pain, nausea, vomiting and diarrhea.   GU  Denies dysuria and urinary frequency.   MSK Denies myalgia and joint pain.   Skin Denies rash and pruritus.   Neurological Denies headache and syncope.   Psychiatric Denies recent changes in mood. Denies anxiety and depression.    Past History   Past Medical History:  Diagnosis Date   Asthma    Dysrhythmia    Afib   Glaucoma    High cholesterol    History of cardiovascular stress test    Myoview 12/19:  EF 50%, No ischemia, Low Risk   Hypertension    Past Surgical History:  Procedure Laterality Date   ABDOMINAL AORTIC ANEURYSM REPAIR N/A 05/25/2019   Procedure: ANEURYSM ABDOMINAL AORTIC REPAIR using a HEMASHIELD GOLD VASCULAR GRAFT;  Surgeon: Larina Earthly, MD;  Location: [facility] OR;  Service: Vascular;  Laterality: N/A;   APPENDECTOMY     EYE SURGERY Right    cataract   INGUINAL HERNIA REPAIR Right 06/15/2015   Procedure: RIGHT INGUINAL  HERNIA REPAIR WITH MESH;  Surgeon: Avel Peace, MD;  Location: [facility] OR;  Service: General;  Laterality: Right;   INSERTION OF MESH Right 06/15/2015   Procedure: INSERTION OF MESH;  Surgeon: Avel Peace, MD;  Location: [facility] OR;  Service: General;  Laterality: Right;   Family History  Problem Relation Age of Onset   Congestive Heart Failure Mother    Heart attack Mother 74   Heart attack Brother 63   Leukemia Brother    Heart failure Sister 13   Stroke Neg Hx    Social History   Socioeconomic History   Marital status: Divorced    Spouse name: Not on file   Number of children: Not on file   Years of education: Not on file   Highest education level: Not on file  Occupational History   Not on file  Tobacco Use   Smoking status: Former    Types: Cigarettes    Quit date: 1970    Years since quitting: 52.6   Smokeless tobacco: Never  Vaping Use   Vaping Use: Never used  Substance and Sexual Activity   Alcohol use: No   Drug use: No   Sexual activity: Not on file  Other Topics Concern   Not on file  Social History Narrative   Not  on file   Social Determinants of Health   Financial Resource Strain: Not on file  Food Insecurity: Not on file  Transportation Needs: Not on file  Physical Activity: Not on file  Stress: Not on file  Social Connections: Not on file   Allergies  Allergen Reactions   Brinzolamide-Brimonidine Shortness Of Breath    Feels he has difficulty breathing when on medication Eye drop medication    Medications  (Not in a hospital admission)    Vitals   Vitals:   05/27/21 1000 05/27/21 1015 05/27/21 1100 05/27/21 1130  BP:   (!) 154/115 (!) 173/120  Pulse: 73 82 78 81  Resp: (!) 23 14 (!) 21 18  Temp:      TempSrc:      SpO2: 99% 96% 97% 96%  Weight:      Height:         Body mass index is 32.01 kg/m.  Physical Exam   General: Laying comfortably in bed; in no acute distress.  HENT: Normal oropharynx and mucosa. Normal external appearance of ears and nose.  Neck: Supple, no pain or tenderness  CV: No JVD. No peripheral edema.  Pulmonary: Symmetric Chest rise. Normal respiratory effort.  Abdomen: Soft to touch, non-tender.  Ext: No cyanosis, edema, or deformity  Skin: No rash. Normal palpation of skin.   Musculoskeletal: Normal digits and nails by inspection. No clubbing.   Neurologic Examination  Mental status/Cognition: Alert, oriented to self, place, month and year, good attention.  Speech/language: Fluent, comprehension intact, object naming intact, repetition intact.  Cranial nerves:   CN II Pupils equal and reactive to light, no VF deficits    CN III,IV,VI EOM intact, no gaze preference or deviation, no nystagmus    CN V normal sensation in V1, V2, and V3 segments bilaterally    CN VII no asymmetry, no nasolabial fold flattening    CN VIII normal hearing to speech    CN IX & X normal palatal elevation, no uvular deviation    CN XI 5/5 head turn and 5/5 shoulder shrug bilaterally    CN XII midline tongue protrusion    Motor:  Muscle bulk: normal, tone  normal,  pronator drift yes LUE drift tremor none Mvmt Root Nerve  Muscle Right Left Comments  SA C5/6 Ax Deltoid 5 4+   EF C5/6 Mc Biceps 5 5   EE C6/7/8 Rad Triceps 5 5   WF C6/7 Med FCR     WE C7/8 PIN ECU     F Ab C8/T1 U ADM/FDI 5 4+   HF L1/2/3 Fem Illopsoas 4+ 4+   KE L2/3/4 Fem Quad 5 5   DF L4/5 D Peron Tib Ant 5 5   PF S1/2 Tibial Grc/Sol 5 5    Reflexes:  Right Left Comments  Pectoralis      Biceps (C5/6) 2 2   Brachioradialis (C5/6) 2 2    Triceps (C6/7) 2 2    Patellar (L3/4) 2 2    Achilles (S1)      Hoffman      Plantar     Jaw jerk    Sensation:  Light touch    Pin prick    Temperature    Vibration   Proprioception    Coordination/Complex Motor:  - Finger to Nose intact BL - Heel to shin intact BL - Rapid alternating movement slowed in L hand. - Gait: Deferred.  Labs   CBC:  Recent Labs  Lab 05/27/21 0739 05/27/21 0750  WBC 7.5  --   NEUTROABS 4.3  --   HGB 16.5 16.3  HCT 49.4 48.0  MCV 94.1  --   PLT 223  --     Basic Metabolic Panel:  Lab Results  Component Value Date   NA 140 05/27/2021   K 4.5 05/27/2021   CO2 29 05/27/2021   GLUCOSE 122 (H) 05/27/2021   BUN 18 05/27/2021   CREATININE 1.10 05/27/2021   CALCIUM 9.1 05/27/2021   GFRNONAA >60 05/27/2021   GFRAA >60 05/26/2019   Lipid Panel: No results found for: LDLCALC HgbA1c: No results found for: HGBA1C Urine Drug Screen: No results found for: LABOPIA, COCAINSCRNUR, LABBENZ, AMPHETMU, THCU, LABBARB  Alcohol Level No results found for: ETH  CT Head without contrast: Personally reviewed and CTH was negative for a large hypodensity concerning for a large territory infarct or hyperdensity concerning for an ICH   CT angio Head and Neck with contrast: Pending.  MRI Brain: pending Impression   VLADISLAV AXELSON is a 65 y.o. male with PMH significant for asthma, glaucoma, HLD, AAA s/p repair in 2020, Afibb on aspirin, HTN who presents with L face numbness and L arm weakness. His  neurologic examination is notable for mild LUE weakness, mostly to hand grip with a NIHSS of 0.  Suspect embolic vs lacunar stroke. He does have a history of Afibb but is not on AC.  Recommendations  Plan:  Recommend that primary team order following: - Frequent Neuro checks per stroke unit protocol - Recommend brain imaging with MRI Brain without contrast - CT Angio head and neck pending. - Recommend obtaining TTE - Recommend obtaining Lipid panel with LDL - Please start statin if LDL > 70 - Recommend HbA1c - Anticoagulation: start date based on what the MRI shows. - Aspirin 81mg  daily for now.(Took it this AM before coming to the hospital) - Recommend DVT ppx - SBP goal - permissive hypertension first 24 h < 220/110. Held home meds.  - Recommend Telemetry monitoring for arrythmia - Recommend bedside swallow screen prior to PO intake. - Stroke education booklet - Recommend PT/OT/SLP consult  ______________________________________________________________________   Thank you for the  opportunity to take part in the care of this patient. If you have any further questions, please contact the neurology consultation attending.  Signed,  [place] Pager Number [phone] _ _ _   _ __   _ __ _ _  __ __   _ __   __ _

## 2021-05-27 NOTE — ED Notes (Signed)
Alert, NAD, calm, denies pain, sob, nausea. Ativan given. To MRI.

## 2021-05-27 NOTE — ED Notes (Signed)
Pt returned from CT °

## 2021-05-27 NOTE — ED Notes (Signed)
Discussed patient with Dr. Goldston. 

## 2021-05-27 NOTE — Progress Notes (Signed)
Subjective:   No acute overnight events. Patient was seen at bedside during rounds this morning. Pt reports feeling better this AM. Improved left arm weakness. Still has some left ear numbness. Pt denies any changes in vision, dysarthria, and changes in memory. No other complains or concerns at this time.   Has A. fib for past 6 years, not on anticoagulation or antiplatelet due to past concerns for side effects. Pt agrees to initiating medication at this time.    Objective:  Vital signs in last 24 hours: Vitals:   05/28/21 0600 05/28/21 0834 05/28/21 0859 05/28/21 1210  BP: (!) 138/95  (!) 160/98 (!) 156/109  Pulse: 85 84 81 82  Resp: 17 20 18 18   Temp:  97.6 F (36.4 C)    TempSrc:  Oral    SpO2: 94% 97% 99% 99%  Weight:      Height:       HENT: normocephalic atraumatic, mucous membranes moist Cardiovascular: regular rate and rhythm, no m/r/g Pulmonary/Chest: normal work of breathing on room air, lungs clear to auscultation bilaterally MSK: normal bulk and tone Skin: warm and dry Psych: normal behavior, normal affect Neuro: patient is awake, alert, oriented x 3, motor strength 5/5 bilaterally with intact sensation.   Assessment/Plan:  Principal Problem:   Cerebral infarction [facility]) Active Problems:   Atrial fibrillation [facility])   Essential hypertension   Hyperlipidemia  Tyler Burns is a 65 yo person with pertinent PMHx of HTN, HLD, AAA s/p repair in 2020 admitted for further work up s/p transient weakness and numbness after an acute CVA.   Acute CVA  Presented with transient left upper extremity and facial numbness as well as left upper extremity flaccid weakness. CT neg for infarct. CTA of head and neck demonstrates significant right carotid artery stenosis > 80%, and right carotid 2021 demonstrates 60-79% stenosis. MRI significant for small acute right frontal and parietal infarcts, and TTE shows mildly dilated left atrial size, and EF 60-65%. Neurology recommends  vascular consult for evaluation of carotid endarterectomy given stenosis of the right ICA and stroke that could be in the setting of atheroembolism. Start DAPT with ASA and Plavix until enterectomy, followed by ASA daily. Can consider transitioning from ASA to [facility] in future.  - Continue Aspirin 81 mg qd - Started Plavix 75 mg qd - held metoprolol for permissive HTN in setting of CVA.  - Continue Atorvastatin 80 mg qd - continue PT/OT   Right carotid artery stenosis  CTA of neck remarkable for a soft plaque of the right carotid artery at the bifurcation, involving the ICA.  There is >80% stenosis of the distal ICA. Started Atorvastatin.  -Vascular surgery consulted, and will likely require outpatient follow up.    Permanent A. Fib  Hx of A. Fib since 2014, but does not take anticoagulants, but is on metoprolol. CHA2DS2-VASc score 4 after CVA. Pt agrees to starting anticoagulants in upcoming weeks after d/c. Patient is rate controlled at this time. -Hold off on anticoagulation as pt started on DAPT   HTN  -Held metoprolol for permissive HTN up to 48 hours post stroke. Per neurology, then permissive HTN up to 160 given carotid stenosis. Post intervention, can target normotensive pressures.   HLD  Hx of HLD, with lipid panel LDL cholesterol 138. Goal for stroke prevention is < 70. Pt on lovastatin at home. Continued high dose statin, atorvastatin 80 qd.     2015, MD 05/28/2021, 2:05 PM Internal Medicine Resident, PGY-1 Pager:  [phone] After 5pm on weekdays and 1pm on weekends: On Call pager [phone]

## 2021-05-27 NOTE — ED Notes (Signed)
Pt NAD in bed with family at bedside. A/ox4, states woke up this AM with left arm heaviness/flaciddity. Pt states no other associated symptoms. Family state scurrent use of left arm is improved. Pt denies other complaints

## 2021-05-28 ENCOUNTER — Observation Stay (HOSPITAL_BASED_OUTPATIENT_CLINIC_OR_DEPARTMENT_OTHER): Payer: Medicare Other

## 2021-05-28 DIAGNOSIS — I6521 Occlusion and stenosis of right carotid artery: Secondary | ICD-10-CM | POA: Diagnosis not present

## 2021-05-28 DIAGNOSIS — I6330 Cerebral infarction due to thrombosis of unspecified cerebral artery: Secondary | ICD-10-CM

## 2021-05-28 DIAGNOSIS — E785 Hyperlipidemia, unspecified: Secondary | ICD-10-CM | POA: Diagnosis present

## 2021-05-28 DIAGNOSIS — I10 Essential (primary) hypertension: Secondary | ICD-10-CM | POA: Diagnosis present

## 2021-05-28 LAB — BASIC METABOLIC PANEL
Anion gap: 7 (ref 5–15)
BUN: 14 mg/dL (ref 8–23)
CO2: 25 mmol/L (ref 22–32)
Calcium: 8.8 mg/dL — ABNORMAL LOW (ref 8.9–10.3)
Chloride: 103 mmol/L (ref 98–111)
Creatinine, Ser: 1.1 mg/dL (ref 0.61–1.24)
GFR, Estimated: >60 mL/min (ref >60)
Glucose, Bld: 138 mg/dL — ABNORMAL HIGH (ref 70–99)
Potassium: 4 mmol/L (ref 3.5–5.1)
Sodium: 135 mmol/L (ref 135–145)

## 2021-05-28 MED ORDER — CLOPIDOGREL BISULFATE 75 MG PO TABS: 75.0000 mg | ORAL_TABLET | Freq: Every day | ORAL | 0 refills | Status: DC

## 2021-05-28 MED ORDER — ATORVASTATIN CALCIUM 80 MG PO TABS: 80.0000 mg | ORAL_TABLET | Freq: Every day | ORAL | 0 refills | Status: DC

## 2021-05-28 MED ORDER — METOPROLOL TARTRATE 50 MG PO TABS: 50.0000 mg | ORAL_TABLET | Freq: Every day | ORAL | Status: DC

## 2021-05-28 MED ORDER — CLOPIDOGREL BISULFATE 75 MG PO TABS
75.0000 mg | ORAL_TABLET | Freq: Every day | ORAL | Status: DC
  Administered 2021-05-28: 75 mg via ORAL
  Filled 2021-05-28: qty 1

## 2021-05-28 NOTE — Hospital Course (Addendum)
Acute CVA: Tyler Burns is presenting with left upper extremity and facial numbness as well as left upper extremity flaccid weakness; majority of patient's symptoms resolved within 24 hours. Initial CT is negative for infarct. Etiology of CVA includes embolic secondary to atrial fibrillation versus carotid artery stenosis. CTA of head and neck demonstrates significant right carotid artery stenosis > 80%, and right carotid US demonstrates 60-79% stenosis. MRI significant for small acute right frontal and parietal infarcts, and TTE shows mildly dilated left atrial size, and EF 60-65%. Neurology recommends vascular consult for evaluation of carotid endarterectomy given stenosis of the right ICA and stroke that could be in the setting of atheroembolism. Start DAPT with ASA and Plavix until enterectomy, followed by ASA daily. Can consider transitioning from ASA to [facility] in future. He was seen by PT/OT during his stay.   Carotid Artery Stenosis, Right CTA of neck remarkable for a soft plaque of the right carotid artery at the bifurcation, involving the ICA.  There is >80% stenosis of the distal ICA. Right carotid US demonstrates 60-79% stenosis. He will likely benefit from vascular intervention, and per vascular surgery consult,  He has adequate length in the common carotid artery and adequate diameters however he does have some disease in the common carotid artery which may potentially be a contraindication.  I have asked [facility] to the review the films and determine if he is a candidate for transcarotid stenting.  If he is then I will arrange for discussion of transcribed stenting with one of my partners who is currently doing TCAR.  From my standpoint I think it would be reasonable to allow him to be discharged on aspirin Plavix and a statin. Follow up with vascular surgery.    Permanent Atrial Fibrillation  Patient has a history of permanent atrial fibrillation dating back to 2014.  CHA2DS2-VASc score was  previously 2 for age and hypertension, however is now 4 given CVA.  We discussed the benefits of anticoagulation prior to or on discharge with either Xarelto or Eliquis; patient is in agreement.     Hypertension Held metoprolol for permissive HTN up to 48 hours post stroke. Per neurology, then permissive HTN up to 160 given carotid stenosis. Post intervention, can target normotensive pressures.     Hyperlipidemia Hx of HLD, with lipid panel LDL cholesterol 138. Goal for stroke prevention is < 70. Pt on lovastatin at home. Continued high dose statin, atorvastatin 80 qd.

## 2021-05-28 NOTE — Care Management Obs Status (Signed)
MEDICARE OBSERVATION STATUS NOTIFICATION   Patient Details  Name: Tyler Burns MRN: [id number] Date of Birth: 1956-10-26   Medicare Observation Status Notification Given:  Yes    Tom-Johnson, Hershal Coria, RN 05/28/2021, 4:06 PM

## 2021-05-28 NOTE — Consult Note (Signed)
ASSESSMENT & PLAN   SYMPTOMATIC RIGHT CAROTID STENOSIS: This patient has a symptomatic right carotid stenosis.  Although the CT angiogram suggested an 80% stenosis, by velocity criteria based on his duplex its more likely a 60% stenosis.  He is now on maximal medical therapy as Plavix has been added.  I would agree that ideally the stenosis should be addressed given that he is symptomatic.  However, I do not think the stenosis is surgically accessible as the plaque extends quite high and I do not think that I could clamp above this.  He may be a candidate for transcarotid stenting.  He has adequate length in the common carotid artery and adequate diameters however he does have some disease in the common carotid artery which may potentially be a contraindication.  I have asked [facility] to the review the films and determine if he is a candidate for transcarotid stenting.  If he is then I will arrange for discussion of transcribed stenting with one of my partners who is currently doing TCAR.  From my standpoint I think it would be reasonable to allow him to be discharged on aspirin Plavix and a statin.  I will be in touch with him once have heard back from [facility]   REASON FOR CONSULT:    Right carotid stenosis with right brain stroke.  The consult is requested by Dr. Oswaldo Done.  HPI:   TEL HEVIA is a 65 y.o. male who yesterday morning developed the sudden onset of left arm weakness.  The arm was profoundly weak and he presented to the emergency department.  The weakness had improved significantly after about 2 hours and at this point has resolved completely.  He had no significant lower extremity weakness.  He denied expressive or receptive aphasia or amaurosis fugax.  He is had no previous history of stroke.  Prior to this event he was on aspirin and a statin.  Plavix has just been added.  He has previously undergone an abdominal aortic aneurysm repair by Dr. Karlene Lineman.  His risk factors  for peripheral vascular disease include hypertension.  He denies any history of diabetes, hypercholesterolemia, family history of premature cardiovascular disease or tobacco use.  According to the records he does have a history of atrial fibrillation.  He is not on anticoagulation.  Past Medical History:  Diagnosis Date   Asthma    Dysrhythmia    Afib   Glaucoma    High cholesterol    History of cardiovascular stress test    Myoview 12/19:  EF 50%, No ischemia, Low Risk   Hypertension     Family History  Problem Relation Age of Onset   Congestive Heart Failure Mother    Heart attack Mother 12   Heart attack Brother 34   Leukemia Brother    Heart failure Sister 45   Stroke Neg Hx     SOCIAL HISTORY: Social History   Tobacco Use   Smoking status: Former    Types: Cigarettes    Quit date: 1970    Years since quitting: 52.6   Smokeless tobacco: Never  Substance Use Topics   Alcohol use: No    Allergies  Allergen Reactions   Brinzolamide-Brimonidine Shortness Of Breath    Feels he has difficulty breathing when on medication Eye drop medication    Current Facility-Administered Medications  Medication Dose Route Frequency Provider Last Rate Last Admin   acetaminophen (TYLENOL) tablet 650 mg  650 mg Oral Q6H PRN Verdene Lennert,  MD       Or   acetaminophen (TYLENOL) suppository 650 mg  650 mg Rectal Q6H PRN Verdene Lennert, MD       aspirin EC tablet 81 mg  81 mg Oral Daily Verdene Lennert, MD   81 mg at 05/28/21 5643   atorvastatin (LIPITOR) tablet 80 mg  80 mg Oral Q supper Verdene Lennert, MD   80 mg at 05/27/21 1712   brinzolamide (AZOPT) 1 % ophthalmic suspension 1 drop  1 drop Both Eyes BID Verdene Lennert, MD   1 drop at 05/28/21 1158   clopidogrel (PLAVIX) tablet 75 mg  75 mg Oral Daily Carmel Sacramento, MD   75 mg at 05/28/21 1457   cycloSPORINE (RESTASIS) 0.05 % ophthalmic emulsion 1 drop  1 drop Both Eyes Q12H Verdene Lennert, MD       enoxaparin (LOVENOX)  injection 40 mg  40 mg Subcutaneous Q24H Verdene Lennert, MD   40 mg at 05/27/21 1712   latanoprost (XALATAN) 0.005 % ophthalmic solution 1 drop  1 drop Left Eye QHS Verdene Lennert, MD   1 drop at 05/27/21 2223   ondansetron (ZOFRAN) tablet 4 mg  4 mg Oral Q6H PRN Verdene Lennert, MD       Or   ondansetron (ZOFRAN) injection 4 mg  4 mg Intravenous Q6H PRN Verdene Lennert, MD       polyethylene glycol (MIRALAX / GLYCOLAX) packet 17 g  17 g Oral Daily PRN Verdene Lennert, MD       sodium chloride flush (NS) 0.9 % injection 3 mL  3 mL Intravenous Once Arby Barrette, MD       sodium chloride flush (NS) 0.9 % injection 3 mL  3 mL Intravenous Q12H Verdene Lennert, MD   3 mL at 05/28/21 1001    REVIEW OF SYSTEMS:  [X]  denotes positive finding, [ ]  denotes negative finding Cardiac  Comments:  Chest pain or chest pressure:    Shortness of breath upon exertion: x   Short of breath when lying flat:    Irregular heart rhythm:        Vascular    Pain in calf, thigh, or hip brought on by ambulation:    Pain in feet at night that wakes you up from your sleep:     Blood clot in your veins:    Leg swelling:         Pulmonary    Oxygen at home:    Productive cough:     Wheezing:         Neurologic    Sudden weakness in arms or legs:  x Left arm  Sudden numbness in arms or legs:  X Left arm  Sudden onset of difficulty speaking or slurred speech:    Temporary loss of vision in one eye:     Problems with dizziness:         Gastrointestinal    Blood in stool:     Vomited blood:         Genitourinary    Burning when urinating:     Blood in urine:        Psychiatric    Major depression:         Hematologic    Bleeding problems:    Problems with blood clotting too easily:        Skin    Rashes or ulcers:        Constitutional    Fever or chills:    -  PHYSICAL EXAM:   Vitals:   05/28/21 0600 05/28/21 0834 05/28/21 0859 05/28/21 1210  BP: (!) 138/95  (!) 160/98 (!) 156/109   Pulse: 85 84 81 82  Resp: 17 20 18 18   Temp:  97.6 F (36.4 C)    TempSrc:  Oral    SpO2: 94% 97% 99% 99%  Weight:      Height:       Body mass index is 32.01 kg/m. GENERAL: The patient is a well-nourished male, in no acute distress. The vital signs are documented above. CARDIAC: There is a regular rate and rhythm.  VASCULAR: I do not detect carotid bruits. He has palpable femoral pulses and palpable dorsalis pedis pulses bilaterally. PULMONARY: There is good air exchange bilaterally without wheezing or rales. ABDOMEN: Soft and non-tender with normal pitched bowel sounds.  He has a healed midline incision. MUSCULOSKELETAL: There are no major deformities. NEUROLOGIC: Currently I did not detect any focal weakness or paresthesias. SKIN: There are no ulcers or rashes noted. PSYCHIATRIC: The patient has a normal affect.  DATA:    MRI BRAIN: He is on MRI of the brain on 05/27/2021 shows small acute right frontal and parietal infarcts.  He has mild chronic small vessel ischemic disease and cerebral atrophy.  CT ANGIO HEAD AND NECK:  There is no intracranial large vessel occlusion.  He has atherosclerotic disease of both carotid bifurcations.  There is extensive soft plaque at the right ICA bulb with a greater than 80% stenosis.  There are mild bilateral vertebral artery origin stenoses.  CAROTID DUPLEX: I have independently interpreted his carotid duplex scan today.  On the right side there is a 60 to 79% stenosis in the proximal ICA.  This is at the lower end of that range. (202/57).  Based on these velocity criteria this would suggest a stenosis of about 60%.  The right vertebral artery is patent with antegrade flow.  On the left side there is a less than 39% stenosis.  The left vertebral artery is patent with antegrade flow.   07/27/2021 [facility]

## 2021-05-28 NOTE — Discharge Instructions (Addendum)
Hey Tyler Burns, you were admitted for a stroke at [facility]. You're symptoms improved within 1 day and MRI imaging show that you had multiple infarcts in your brain from a deficiency in blood, resulting in a stroke. Additionally, carotid ultrasound show you're right carotid is significantly narrowed, which may have resulted in the stroke. You were started on Asprin 81 and Plavix 75. You should take the Plavix for 21 days, and then stop. Additionally, you will benefit from a procedure done to your right carotid to decrease your risk of stroke, about which you will hear from Dr. Jaynie Collins. If you do not hear from him, you can call the office to directly make an appointment.

## 2021-05-28 NOTE — ED Notes (Signed)
Attempted to call report, told by receptionist nurses are taking report. Still. 

## 2021-05-28 NOTE — Progress Notes (Addendum)
STROKE TEAM PROGRESS NOTE    Interval History   No acute events overnight, patient is sitting up in the chair at bedside. Girlfriend is present in the room.   Discussed need for carotid ultrasound to further evaluate right carotid stenosis given calcifications on imaging. Also informed patient about the need for vascular surgery to evaluate him for endarterectomy versus TCAR.   Neurological examination is notable for diminished fine finger movements to the LUE and numbness to the LUE.    Pertinent Lab Work and Imaging    05/27/21 CT Head WO IV Contrast 1. No acute intracranial abnormalities. 2. Chronic microvascular ischemic changes in the cerebral white matter, as above.  05/27/21 CT Angio Head and Neck W WO IV Contrast No intracranial large vessel occlusion. No correctable intracranial proximal stenosis. Distal vessels show atherosclerotic irregularity.  Atherosclerotic disease at both carotid bifurcations. Extensive primarily soft plaque at the bifurcation and ICA bulb on the right. ICA lumen 1 mm or less, indicating greater than 80% stenosis.   Atherosclerotic plaque at the left carotid bifurcation with maximal stenosis in the distal bulb measuring 50%.   Bilateral vertebral artery origin stenoses, 50% on the right and 30-50% on the left.  05/27/21 MRI Brain WO IV Contrast 1. 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 2. Mild chronic small vessel ischemic disease and cerebral atrophy.  05/27/21 Echocardiogram Complete   1. Left ventricular ejection fraction, by estimation, is 60 to 65%. The left ventricle has normal function. The left ventricle has no regional wall motion abnormalities. There is mild left ventricular hypertrophy. Left ventricular diastolic parameters are indeterminate.   2. Right ventricular systolic function is normal. The right ventricular size is normal. Tricuspid regurgitation signal is inadequate for assessing PA pressure.   3. Left atrial size was mildly  dilated.   4. The mitral valve is normal in structure. No evidence of mitral valve regurgitation. No evidence of mitral stenosis.   5. The aortic valve is tricuspid. Aortic valve regurgitation is not visualized. No aortic stenosis is present.   6. The inferior vena cava is dilated in size with >50% respiratory variability, suggesting right atrial pressure of 8 mmHg.   05/28/21 Bilateral Carotid Ultrasound ( Preliminary Read)  Right Carotid: Velocities in the right ICA are consistent with a 60-79% stenosis.   Left Carotid: Velocities in the left ICA are consistent with a 1-39% stenosis.   Vertebrals: Bilateral vertebral arteries demonstrate antegrade flow.  Physical Examination  Pleasant middle-aged Caucasian male not in distress. Constitutional: Pleasant Caucasian male sitting up in chair besides the bed  Cardiovascular: Normal RR Respiratory: No increased WOB   Mental status: AAOx4, following commands  Speech: Fluent, naming and repetition intact  Cranial nerves: EOMI, VFF, Face symmetric, Tongue midline, Shoulder shrug intact  Motor: Normal bulk and tone. No drift. Antigravity throughout. Diminished fine finger movements to the left hand.  Left grip weak.  Orbits right over left upper extremity. Sensory: States that he has numbness to his left arm, reports sensation to touch is equal to BUE, BLE and diminished to the left side of his face  Coordination: Intact FNF/HTS  Gait: Deferred   Assessment and Plan   [name] is a 65 y.o. male w/pmh of asthma, glaucoma, HLD, AAA s/p repair in 2020, atrial fibrillation on aspirin, HTN who presents with L face numbness and L arm weakness. Did not receive IVTPA due to low NIHSS, thrombectomy not obtained due to lack of LVO.    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 At this time, stroke work up is essentially complete. Initial CTH w/NAICP. CTA Head and Neck was notable for 80 % stenosis of the RICA; CUS for further evaluation of stenosis with right ICA 60 to 79 % stenosis, left ICA with 1-39 % stenosis. Echo w/EF 60 to 65 % and LA mildly dilated. Stroke labs w/LDL 56, hemoglobin A1C 6.3. Given stenosis of the right ICA and stroke that could be in the setting of atheroembolism, recommend vascular consult for evaluation for carotid endarterectomy. Regarding his atrial fibrillation he has been seen by Cardiology; was diagnosed in 2014, has declined anticoagulation in the past. He has not consistently taken his Aspirin at home. DAPT was started this admission along with Atorvastatin 80 mg for stroke prevention.  - Consult vascular surgery for intervention  - Continue DAPT and after endarterectomy can continue with Aspirin. Will also discuss anticoagulation with him and potentially transition from Aspirin to [facility] in the future.  - Continue Atorvastatin 80 for stroke prevention  - Have placed a referral for stroke follow up   #Hypertension #Atrial fibrillation  He has a history of HTN and atrial fibrillation. Last saw Cardiology on 11/01/19; they started him on Metoprolol-XL 50 mg. Currently blood pressure is trending in the 130-150 range. Recommend permissive hypertension 48 hours post stroke and from there to be permissive up to 160 given carotid stenosis. Post intervention can target normotensive pressures.  - Continue to allow for permissive hypertension up to 160 given stenosis  - Avoid acute blood pressure drops   #Hyperlipidemia From a stroke prevention stand point, the LDL goal is < 70. His LDL is at goal at 56 however given carotid stenosis agree with Atorvastatin 80 being initiated this admission.  - Continue Atorvastatin 80 mg   #Stroke Dysphagia Screening Passed stroke swallow for a heart healthy diet.   #Type 2 Diabetes  Hemoglobin A1C  this admission noted to be 6.3 which is at goal < 7 from a stroke standpoint. - Recommend SSI management while inpatient.    Hospital day # 0  Stark Jock, NP  [facility] Nurse Practitioner Patient seen and discussed with attending physician Dr. Priscille Heidelberg MD Note :  I have personally obtained history,examined this patient, reviewed notes, independently viewed imaging studies, participated in medical decision making and plan of care.ROS completed by me personally and pertinent positives fully documented  I have made any additions or clarifications directly to the above note. Agree with note above.  He presented with left hand weakness and left face and arm numbness secondary to right frontal and parietal embolic infarcts likely symptomatic from proximal high-grade carotid stenosis though he has history of A. fib as well.  Recommend check carotid ultrasound to further evaluate his heavily calcific right carotid stenosis and vascular surgery consult to consider early revascularization with surgery versus angioplasty stenting.  Aggressive risk factor modification.  Long discussion with patient and wife about risk benefit of revascularization and recurrent stroke risk and answering questions.  Discussed with Dr. Benjamine Mola.  Greater than 50% time during this 35-minute visit was spent in counseling and coordination of care and discussion with care team.  Delia Heady, MD Medical Director [facility] St[facility] Pager: [phone] 05/28/2021 3:04 PM   To contact Stroke Continuity provider, please refer to [email or web address]. After hours, contact General  Neurology

## 2021-05-28 NOTE — Progress Notes (Signed)
Carotid duplex has been completed.   Preliminary results in CV Proc.   Blanch Media 05/28/2021 11:46 AM

## 2021-05-28 NOTE — TOC Transition Note (Signed)
Transition of Care [facility]) - CM/SW Discharge Note   Patient Details  Name: Tyler Burns MRN: [id number] Date of Birth: 1956/04/19  Transition of Care [facility]) CM/SW Contact:  Tom-Johnson, Hershal Coria, RN Phone Number: 05/28/2021, 4:12 PM   Clinical Narrative:    Patient is discharged home. Declined out patient therapy.Girlfriend at bedside states she will assist him to walk every morning. Denies non compliant with meds. States he takes them religiously.Family will provide transport home. No further needs at this time.  Final next level of care: Home/Self Care Barriers to Discharge: Barriers Resolved   Patient Goals and CMS Choice Patient states their goals for this hospitalization and ongoing recovery are:: To go home      Discharge Placement                       Discharge Plan and Services                                     Social Determinants of Health (SDOH) Interventions     Readmission Risk Interventions No flowsheet data found.

## 2021-05-28 NOTE — Evaluation (Signed)
Occupational Therapy Evaluation Patient Details Name: Tyler Burns MRN: [id number] DOB: May 29, 1956 Today's Date: 05/28/2021    History of Present Illness 65 yo male with onset of L side weakness was admitted, had findings of LUE flaccidity and numbness on LUE and face.  MRI shows small acute right frontal and parietal infarcts.  Sensation and movement of LUE is resolving, has ongoing L LE weakness and incoordination.  PMHx:  HTN, AAA repair, HLD, glaucoma, asthma, back injury   Clinical Impression   PTA, pt was independent and lived with his girlfriend. Currently, pt performing UB and LB ADLs at mod I level. Performing functional mobility with mod I. Pt washing hands and performing shower transfer with mod I during session. Pt presents with mild balance deficits. Recommend discharge home with no follow up OT. Will follow acutely to independence in ADL/IADLs.     Follow Up Recommendations  No OT follow up    Equipment Recommendations  None recommended by OT    Recommendations for Other Services       Precautions / Restrictions Precautions Precautions: Fall      Mobility Bed Mobility     General bed mobility comments: Pt sitting EOB on arrival    Transfers Overall transfer level: Modified independent Equipment used: None         General transfer comment: Mod I for increased time.    Balance Overall balance assessment: Mild deficits observed, not formally tested     ADL either performed or assessed with clinical judgement   ADL Overall ADL's : Modified independent       General ADL Comments: Mod I for increased time. Washing hands during session and managing gown on back side as it was falling off. Performing shower transfer with mod I for increased time.     Vision Baseline Vision/History: Wears glasses Wears Glasses: Distance only (Pt reporting he wears glasses to drive) Patient Visual Report: No change from baseline Vision Assessment?: No apparent visual  deficits Additional Comments: Perfroming scanning at sink, reading text messages, and reading time on analogue clock     Perception Perception Perception Tested?: No Comments: no apparent difficulty with perception   Praxis Praxis Praxis tested?: Not tested Praxis-Other Comments: no apparent difficulty with motor planning.    Pertinent Vitals/Pain Pain Assessment: Faces Faces Pain Scale: No hurt     Hand Dominance Right   Extremity/Trunk Assessment Upper Extremity Assessment Upper Extremity Assessment: Overall WFL for tasks assessed (decreased grip strength 4+/5; feel he is close to basline.)   Lower Extremity Assessment Lower Extremity Assessment: Defer to PT evaluation   Cervical / Trunk Assessment Cervical / Trunk Assessment: Normal   Communication Communication Communication: No difficulties   Cognition Arousal/Alertness: Awake/alert Behavior During Therapy: WFL for tasks assessed/performed Overall Cognitive Status: Within Functional Limits for tasks assessed   General Comments: Pt pleasant and conversational throughout session. Pt demonstrating various level of attention with daughter and girlfriend talking during session and pt attenting to task and conversation. Pt reporting he feels his cognition is back at baseline. Pt making jokes throughout session regarding money. Unsure if pt making jokes to mask potential cognitive deficits   General Comments  Pt girlfriend and daughter present during session; reviewed the BE FAST acronym with pt, daughter, and girlfriend. VSS. Pt with decreased sensation in L shoulder and ear, unable to sense light touch.            Home Living Family/patient expects to be discharged to:: Private residence Living Arrangements:  Spouse/significant other Available Help at Discharge: Family;Available 24 hours/day Type of Home: House Home Access: Ramped entrance     Home Layout: One level     Bathroom Shower/Tub: Company secretary: Handicapped height Bathroom Accessibility: No   Home Equipment: Shower seat          Prior Functioning/Environment Level of Independence: Independent        Comments: retired;disabled        OT Problem List: Decreased strength;Decreased activity tolerance;Decreased cognition      OT Treatment/Interventions: Self-care/ADL training;Therapeutic exercise;Therapeutic activities;Cognitive remediation/compensation;Patient/family education    OT Goals(Current goals can be found in the care plan section) Acute Rehab OT Goals Patient Stated Goal: go home OT Goal Formulation: With patient Time For Goal Achievement: 06/11/21 Potential to Achieve Goals: Good  OT Frequency: Min 2X/week    AM-PAC OT "6 Clicks" Daily Activity     Outcome Measure Help from another person eating meals?: None Help from another person taking care of personal grooming?: None Help from another person toileting, which includes using toliet, bedpan, or urinal?: None Help from another person bathing (including washing, rinsing, drying)?: None Help from another person to put on and taking off regular upper body clothing?: None Help from another person to put on and taking off regular lower body clothing?: None 6 Click Score: 24   End of Session Equipment Utilized During Treatment: Gait belt Nurse Communication: Mobility status  Activity Tolerance: Patient tolerated treatment well Patient left: in bed;with call bell/phone within reach;with family/visitor present  OT Visit Diagnosis: Unsteadiness on feet (R26.81);Muscle weakness (generalized) (M62.81)                Time: 2671-2458 OT Time Calculation (min): 19 min Charges:  OT General Charges $OT Visit: 1 Visit OT Evaluation $OT Eval Moderate Complexity: 1 [address] OTDS   Ladene Artist 05/28/2021, 1:35 PM

## 2021-05-28 NOTE — Evaluation (Signed)
Physical Therapy Evaluation Patient Details Name: Tyler Burns MRN: [id number] DOB: [birth year] Today's Date: 05/28/2021   History of Present Illness  65 yo male with onset of L side weakness was admitted, had findings of LUE flaccidity and numbness on LUE and face.  MRI shows small acute right frontal and parietal infarcts.  Sensation and movement of LUE is resolving, has ongoing L LE weakness and incoordination.  PMHx:  HTN, AAA repair, HLD, glaucoma, asthma, back injury  Clinical Impression  Pt was seen for mobility in his hosp room and was noted to have a 46/56 Berg test score.  Pt is avoiding the use of an AD, but could likely benefit from [facility] to steady his LLE.   He is declining any follow up therapy but have talked with him about the results of eval that indicate this would be helpful.  Will reiterate safety information, and per OT conversation will be getting further cognitive evaluation to see if more retraining will be helpful for safety and retention of  progress.    Follow Up Recommendations Outpatient PT    Equipment Recommendations  None recommended by PT    Recommendations for Other Services       Precautions / Restrictions Precautions Precautions: Fall Precaution Comments: pt is unaware of his fall risk Restrictions Weight Bearing Restrictions: No      Mobility  Bed Mobility               General bed mobility comments: in chair when PT arrives    Transfers Overall transfer level: Modified independent Equipment used: None             General transfer comment: mod I but requires a bit of supervision  Ambulation/Gait Ambulation/Gait assistance: Min guard Gait Distance (Feet): 180 Feet Assistive device: 1 person hand held assist Gait Pattern/deviations: Step-through pattern;Wide base of support;Decreased stride length Gait velocity: reduced   General Gait Details: pt is walking on hallway with no overt LOB but is mildly swaying  Stairs             Wheelchair Mobility    Modified Rankin (Stroke Patients Only) Modified Rankin (Stroke Patients Only) Pre-Morbid Rankin Score: No significant disability Modified Rankin: Moderate disability     Balance Overall balance assessment: Needs assistance Sitting-balance support: Feet supported Sitting balance-Leahy Scale: Good     Standing balance support: No upper extremity supported Standing balance-Leahy Scale: Fair Standing balance comment: less than fair dynamically                             Pertinent Vitals/Pain Pain Assessment: No/denies pain Faces Pain Scale: No hurt    Home Living Family/patient expects to be discharged to:: Private residence Living Arrangements: Spouse/significant other Available Help at Discharge: Family;Available 24 hours/day Type of Home: House Home Access: Ramped entrance     Home Layout: One level Home Equipment: Shower seat      Prior Function Level of Independence: Independent         Comments: retired;disabled     Hand Dominance   Dominant Hand: Right    Extremity/Trunk Assessment   Upper Extremity Assessment Upper Extremity Assessment: Defer to OT evaluation    Lower Extremity Assessment Lower Extremity Assessment: LLE deficits/detail    Cervical / Trunk Assessment Cervical / Trunk Assessment: Other exceptions (has history of back injury)  Communication   Communication: No difficulties  Cognition Arousal/Alertness: Awake/alert Behavior During Therapy: WFL for  tasks assessed/performed Overall Cognitive Status: Within Functional Limits for tasks assessed                                 General Comments: pt is joking and deferring attention from the questions PT is asking      General Comments General comments (skin integrity, edema, etc.): reviewed changes of berg test with pt who was a bit dismissive, states he has been off balance and does not need further help with it    Exercises      Assessment/Plan    PT Assessment Patient needs continued PT services  PT Problem List Decreased strength;Decreased activity tolerance;Decreased balance;Decreased safety awareness;Decreased coordination       PT Treatment Interventions DME instruction;Gait training;Functional mobility training;Therapeutic activities;Therapeutic exercise;Balance training;Neuromuscular re-education;Patient/family education    PT Goals (Current goals can be found in the Care Plan section)  Acute Rehab PT Goals Patient Stated Goal: go home PT Goal Formulation: With patient Time For Goal Achievement: 06/11/21 Potential to Achieve Goals: Good    Frequency Min 3X/week   Barriers to discharge Decreased caregiver support home with independence at times    Co-evaluation               AM-PAC PT "6 Clicks" Mobility  Outcome Measure Help needed turning from your back to your side while in a flat bed without using bedrails?: None Help needed moving from lying on your back to sitting on the side of a flat bed without using bedrails?: A Little Help needed moving to and from a bed to a chair (including a wheelchair)?: A Little Help needed standing up from a chair using your arms (e.g., wheelchair or bedside chair)?: A Little Help needed to walk in hospital room?: A Little Help needed climbing 3-5 steps with a railing? : A Little 6 Click Score: 19    End of Session Equipment Utilized During Treatment: Gait belt Activity Tolerance: Patient limited by fatigue;Treatment limited secondary to medical complications (Comment) Patient left: in chair;with call bell/phone within reach Nurse Communication: Mobility status PT Visit Diagnosis: Ataxic gait (R26.0);Muscle weakness (generalized) (M62.81);Hemiplegia and hemiparesis Hemiplegia - Right/Left: Left Hemiplegia - dominant/non-dominant: Non-dominant Hemiplegia - caused by: Cerebral infarction    Time: 7846-9629 PT Time Calculation (min) (ACUTE ONLY):  19 min   Charges:   PT Evaluation $PT Eval Moderate Complexity: 1 Mod         Ivar Drape 05/28/2021, 2:15 PM  Samul Dada, PT MS Acute Rehab Dept. Number: [facility] [id number] and [facility] [phone]

## 2021-05-28 NOTE — Discharge Summary (Addendum)
Name: Tyler Burns MRN: [id number] DOB: 1955-11-15 65 y.o. PCP: Quitman Livings, MD  Date of Admission: 05/27/2021  7:08 AM Date of Discharge:  05/28/2021 Attending Physician: Dr. Oswaldo Done  DISCHARGE DIAGNOSIS:  Primary Problem: Cerebral infarction [facility])   Hospital Problems: Principal Problem:   Cerebral infarction [facility]) Active Problems:   Atrial fibrillation (HCC)   Essential hypertension   Hyperlipidemia    DISCHARGE MEDICATIONS:   Allergies as of 05/28/2021       Reactions   Brinzolamide-brimonidine Shortness Of Breath   Feels he has difficulty breathing when on medication Eye drop medication        Medication List     STOP taking these medications    furosemide 40 MG tablet Commonly known as: LASIX   HYDROcodone-acetaminophen 5-325 MG tablet Commonly known as: NORCO/VICODIN   lovastatin 40 MG tablet Commonly known as: MEVACOR   Turmeric Curcumin 500 MG Caps       TAKE these medications    aspirin EC 81 MG tablet Take 1 tablet (81 mg total) by mouth daily.   atorvastatin 80 MG tablet Commonly known as: LIPITOR Take 1 tablet (80 mg total) by mouth daily with supper. Start taking on: May 29, 2021   bimatoprost 0.01 % Soln Commonly known as: LUMIGAN Place 1 drop into the left eye at bedtime.   brinzolamide 1 % ophthalmic suspension Commonly known as: AZOPT Place 1 drop into both eyes 2 (two) times a day.   clopidogrel 75 MG tablet Commonly known as: PLAVIX Take 1 tablet (75 mg total) by mouth daily for 21 days. Start taking on: May 29, 2021   Fish Oil 600 MG Caps Take 600 mg by mouth daily.   metoprolol succinate 50 MG 24 hr tablet Commonly known as: TOPROL-XL Take 50 mg by mouth daily. Take with or immediately following a meal.   Restasis 0.05 % ophthalmic emulsion Generic drug: cycloSPORINE Place 1 drop into both eyes every 12 (twelve) hours.   vitamin E 180 MG (400 UNITS) capsule Take 400 Units by mouth daily.       ASK your  doctor about these medications    cetirizine 10 MG tablet Commonly known as: ZYRTEC Take 1 tablet (10 mg total) by mouth daily. One tab daily for allergies        DISPOSITION AND FOLLOW-UP:  Tyler Burns was discharged from [facility] in stable condition. At the hospital follow up visit please address:  Follow-up Recommendations: Consults: Vascular surgery, neurology Labs: none Studies: none  Medications: continue to take aspirin, take plavix for 21 days, continue atorvastatin 80 daily.   Follow-up Appointments:  Follow-up Information     Quitman Livings, MD Follow up in 1 week(s).   Specialty: Internal Medicine Why: Make appiontment with PCP within 1 week of discharge from the hopsital. Contact information: [address] [place] [address] [phone]         [facility] Schedule an appointment as soon as possible for a visit in 3 week(s).   Contact information: [address] [place] [address] [phone]         Chuck Hint, MD. Schedule an appointment as soon as possible for a visit in 2 week(s).   Specialties: Vascular Surgery, Cardiology Contact information: [address] [place] [address] [phone]                 HOSPITAL COURSE:  Patient Summary: Acute CVA: Tyler.  Burns is presenting with left upper extremity and facial numbness as well as left upper extremity flaccid weakness; majority of patient's symptoms resolved within 24 hours. Initial CT is negative for infarct. Etiology of CVA includes embolic secondary to atrial fibrillation versus carotid artery stenosis. CTA of head and neck demonstrates significant right carotid artery stenosis > 80%, and right carotid US demonstrates 60-79% stenosis. MRI significant for small acute right frontal and parietal infarcts, and TTE shows mildly dilated left atrial size, and EF 60-65%. Neurology recommends vascular consult for  evaluation of carotid endarterectomy given stenosis of the right ICA and stroke that could be in the setting of atheroembolism. Start DAPT with ASA and Plavix until enterectomy, followed by ASA daily. Can consider transitioning from ASA to [facility] in future. He was seen by PT/OT during his stay.   Carotid Artery Stenosis, Right CTA of neck remarkable for a soft plaque of the right carotid artery at the bifurcation, involving the ICA.  There is >80% stenosis of the distal ICA. Right carotid US demonstrates 60-79% stenosis. He will likely benefit from vascular intervention, and per vascular surgery consult,  He has adequate length in the common carotid artery and adequate diameters however he does have some disease in the common carotid artery which may potentially be a contraindication.  I have asked [facility] to the review the films and determine if he is a candidate for transcarotid stenting.  If he is then I will arrange for discussion of transcribed stenting with one of my partners who is currently doing TCAR.  Plan is to medically manage for now with aspirin, Plavix, and atorvastatin with follow-up in the vascular surgery clinic.    Permanent Atrial Fibrillation  Patient has a history of permanent atrial fibrillation dating back to 2014.  CHA2DS2-VASc score was previously 2 for age and hypertension, however is now 4 given CVA.  We discussed the benefits of anticoagulation with either Xarelto or Eliquis; patient is in agreement.  Plan is to continue dual antiplatelet therapy for 3 weeks.  After he discontinues Plavix, he can start apixaban along with aspirin for secondary stroke prevention.  This timing may be adjusted if he has surgical intervention on his right carotid artery.  He will have follow-up in the stroke clinic and vascular surgery clinic to work out the timing of this medication change.   Hypertension Held metoprolol for permissive HTN up to 48 hours post stroke. Per neurology, then  permissive HTN up to 160 given carotid stenosis. Post intervention, can target normotensive pressures.     Hyperlipidemia Hx of HLD, with lipid panel LDL cholesterol 138. Goal for stroke prevention is < 70. Pt on lovastatin at home. Continued high dose statin, atorvastatin 80 qd.  Should recheck lipids in about 4 weeks, I suspect he may need a second agent like Zetia to achieve his LDL goal.  DISCHARGE INSTRUCTIONS:   Discharge Instructions     Call MD for:  difficulty breathing, headache or visual disturbances   Complete by: As directed    Call MD for:  extreme fatigue   Complete by: As directed    Call MD for:  persistant dizziness or light-headedness   Complete by: As directed    Call MD for:  persistant nausea and vomiting   Complete by: As directed    Call MD for:  severe uncontrolled pain   Complete by: As directed    Call MD for:  temperature >100.4   Complete by: As directed  Diet - low sodium heart healthy   Complete by: As directed    Increase activity slowly   Complete by: As directed        SUBJECTIVE:   Tyler Burns was seen at bedside today. No acute overnight events. Pt reports feeling better this morning, and mentions an improved left arm weakness. Still has some left ear numbness. Patient denies any changes in vision, dysarthria, and changes in memory. No other complains or concerns at this time. The patient is happy that his symptoms resolved within 1 day, and plans to follow up with vascular surgery. Has A. fib for past 6 years, not on anticoagulation or antiplatelet due to past concerns for side effects. Pt agrees to initiating medication at this time.      Discharge Vitals:   BP (!) 172/109 (BP Location: Left Arm)   Pulse 76   Temp 97.6 F (36.4 C) (Oral)   Resp 18   Ht 6' (1.829 m)   Wt 107 kg   SpO2 95%   BMI 32.01 kg/m   OBJECTIVE:   HENT: normocephalic atraumatic, mucous membranes moist Cardiovascular: regular rate and rhythm, no  m/r/g Pulmonary/Chest: normal work of breathing on room air, lungs clear to auscultation bilaterally MSK: normal bulk and tone Skin: warm and dry Psych: normal behavior, normal affect Neuro: patient is awake, alert, oriented x 3, motor strength 5/5 bilaterally with intact sensation.   Pertinent Labs, Studies, and Procedures:  CBC Latest Ref Rng & Units 05/27/2021 05/27/2021 05/26/2019  WBC 4.0 - 10.5 K/uL - 7.5 15.2(H)  Hemoglobin 13.0 - 17.0 g/dL 61.9 50.9 32.6  Hematocrit 39.0 - 52.0 % 48.0 49.4 42.6  Platelets 150 - 400 K/uL - 223 182    CMP Latest Ref Rng & Units 05/28/2021 05/27/2021 05/27/2021  Glucose 70 - 99 mg/dL 712(W) 580(D) 983(J)  BUN 8 - 23 mg/dL 14 18 16   Creatinine 0.61 - 1.24 mg/dL 8.25 0.53  Sodium 135 - 145 mmol/L 135 140 137  Potassium 3.5 - 5.1 mmol/L 4.0 4.5 4.8  Chloride 98 - 111 mmol/L 103 104 102  CO2 22 - 32 mmol/L 25 - 29  Calcium 8.9 - 10.3 mg/dL 9.76) - 9.1  Total Protein 6.5 - 8.1 g/dL - - 6.5  Total Bilirubin 0.3 - 1.2 mg/dL - - 0.5  Alkaline Phos 38 - 126 U/L - - 57  AST 15 - 41 U/L - - 18  ALT 0 - 44 U/L - - 15    CT Angio Head W or Wo Contrast  Result Date: 05/27/2021 CLINICAL DATA:  Left arm weakness. Neuro deficit, acute, stroke suspected. EXAM: CT ANGIOGRAPHY HEAD AND NECK TECHNIQUE: Multidetector CT imaging of the head and neck was performed using the standard protocol during bolus administration of intravenous contrast. Multiplanar CT image reconstructions and MIPs were obtained to evaluate the vascular anatomy. Carotid stenosis measurements (when applicable) are obtained utilizing NASCET criteria, using the distal internal carotid diameter as the denominator. CONTRAST:  51mL OMNIPAQUE IOHEXOL 350 MG/ML SOLN COMPARISON:  Head CT same day FINDINGS: CTA NECK FINDINGS Aortic arch: Aortic atherosclerosis. Branching pattern is normal without origin stenosis. Right carotid system: Common carotid artery shows some scattered plaque but is patent to the  bifurcation. No stenosis greater than 20%. At carotid bifurcation, there is soft and calcified plaque. Very severe stenosis of the internal carotid artery at the distal bulb, where the diameter measures 1 mm or less. Compared to an expected distal ICA diameter of 5  mm, this would be a stenosis of 80% or greater. There is some flow reduced diameter of the distal ICA on today's study. Left carotid system: Common carotid artery shows some scattered plaque but is patent to the bifurcation without stenosis greater than 20%. Soft and calcified plaque at the carotid bifurcation and ICA bulb. Minimal diameter in the distal bulb is 2.5 mm. Compared to a more distal cervical ICA diameter of 5 mm, this indicates a 50% stenosis. Vertebral arteries: Both vertebral arteries show calcified plaque at their origins with stenosis estimated at 50% on the right and 30-50% on the left. Beyond the origins, the vessels are widely patent through the cervical region to the foramen magnum. Skeleton: Ordinary minimal spondylosis. Other neck: No mass or lymphadenopathy. Upper chest: Pleural and parenchymal scarring at the lung apices. No active process evident. Review of the MIP images confirms the above findings CTA HEAD FINDINGS Anterior circulation: Both internal carotid arteries are patent through the skull base and siphon regions. There is siphon atherosclerotic calcification with stenosis estimated at 30% on both sides. The anterior and middle cerebral vessels are patent. No large vessel occlusion. Distal vessels do show some atherosclerotic irregularity. Posterior circulation: Both vertebral arteries are patent through the foramen magnum to the basilar. No basilar stenosis. Posterior circulation branch vessels show flow. Left PCA takes fetal origin from the anterior circulation. Venous sinuses: Patent and normal. Anatomic variants: None significant. Review of the MIP images confirms the above findings IMPRESSION: No intracranial large  vessel occlusion. No correctable intracranial proximal stenosis. Distal vessels show atherosclerotic irregularity. Aortic Atherosclerosis (ICD10-I70.0). Atherosclerotic disease at both carotid bifurcations. Extensive primarily soft plaque at the bifurcation and ICA bulb on the right. ICA lumen 1 mm or less, indicating greater than 80% stenosis. Atherosclerotic plaque at the left carotid bifurcation with maximal stenosis in the distal bulb measuring 50%. Bilateral vertebral artery origin stenoses, 50% on the right and 30-50% on the left. Electronically Signed   By: Paulina Fusi M.D.   On: 05/27/2021 13:11   CT Head Wo Contrast  Result Date: 05/27/2021 CLINICAL DATA:  65 year old male with history of left arm weakness. Possible cerebrovascular accident. EXAM: CT HEAD WITHOUT CONTRAST TECHNIQUE: Contiguous axial images were obtained from the base of the skull through the vertex without intravenous contrast. COMPARISON:  Head CT 04/16/2010. FINDINGS: Brain: Patchy and confluent areas of decreased attenuation are noted throughout the deep and periventricular white matter of the cerebral hemispheres bilaterally, compatible with chronic microvascular ischemic disease. Tiny cortical calcification in the posterior right frontal region (axial image 22 of series 3), new compared to the prior study, likely sequela of prior infection. No evidence of acute infarction, hemorrhage, hydrocephalus, extra-axial collection or mass lesion/mass effect. Vascular: No hyperdense vessel or unexpected calcification. Skull: Normal. Negative for fracture or focal lesion. Sinuses/Orbits: No acute finding. Other: None. IMPRESSION: 1. No acute intracranial abnormalities. 2. Chronic microvascular ischemic changes in the cerebral white matter, as above. Electronically Signed   By: Trudie Reed M.D.   On: 05/27/2021 10:31   CT Angio Neck W and/or Wo Contrast  Result Date: 05/27/2021 CLINICAL DATA:  Left arm weakness. Neuro deficit, acute,  stroke suspected. EXAM: CT ANGIOGRAPHY HEAD AND NECK TECHNIQUE: Multidetector CT imaging of the head and neck was performed using the standard protocol during bolus administration of intravenous contrast. Multiplanar CT image reconstructions and MIPs were obtained to evaluate the vascular anatomy. Carotid stenosis measurements (when applicable) are obtained utilizing NASCET criteria, using the distal internal carotid  diameter as the denominator. CONTRAST:  80mL OMNIPAQUE IOHEXOL 350 MG/ML SOLN COMPARISON:  Head CT same day FINDINGS: CTA NECK FINDINGS Aortic arch: Aortic atherosclerosis. Branching pattern is normal without origin stenosis. Right carotid system: Common carotid artery shows some scattered plaque but is patent to the bifurcation. No stenosis greater than 20%. At carotid bifurcation, there is soft and calcified plaque. Very severe stenosis of the internal carotid artery at the distal bulb, where the diameter measures 1 mm or less. Compared to an expected distal ICA diameter of 5 mm, this would be a stenosis of 80% or greater. There is some flow reduced diameter of the distal ICA on today's study. Left carotid system: Common carotid artery shows some scattered plaque but is patent to the bifurcation without stenosis greater than 20%. Soft and calcified plaque at the carotid bifurcation and ICA bulb. Minimal diameter in the distal bulb is 2.5 mm. Compared to a more distal cervical ICA diameter of 5 mm, this indicates a 50% stenosis. Vertebral arteries: Both vertebral arteries show calcified plaque at their origins with stenosis estimated at 50% on the right and 30-50% on the left. Beyond the origins, the vessels are widely patent through the cervical region to the foramen magnum. Skeleton: Ordinary minimal spondylosis. Other neck: No mass or lymphadenopathy. Upper chest: Pleural and parenchymal scarring at the lung apices. No active process evident. Review of the MIP images confirms the above findings CTA  HEAD FINDINGS Anterior circulation: Both internal carotid arteries are patent through the skull base and siphon regions. There is siphon atherosclerotic calcification with stenosis estimated at 30% on both sides. The anterior and middle cerebral vessels are patent. No large vessel occlusion. Distal vessels do show some atherosclerotic irregularity. Posterior circulation: Both vertebral arteries are patent through the foramen magnum to the basilar. No basilar stenosis. Posterior circulation branch vessels show flow. Left PCA takes fetal origin from the anterior circulation. Venous sinuses: Patent and normal. Anatomic variants: None significant. Review of the MIP images confirms the above findings IMPRESSION: No intracranial large vessel occlusion. No correctable intracranial proximal stenosis. Distal vessels show atherosclerotic irregularity. Aortic Atherosclerosis (ICD10-I70.0). Atherosclerotic disease at both carotid bifurcations. Extensive primarily soft plaque at the bifurcation and ICA bulb on the right. ICA lumen 1 mm or less, indicating greater than 80% stenosis. Atherosclerotic plaque at the left carotid bifurcation with maximal stenosis in the distal bulb measuring 50%. Bilateral vertebral artery origin stenoses, 50% on the right and 30-50% on the left. Electronically Signed   By: Paulina Fusi M.D.   On: 05/27/2021 13:11   Tyler BRAIN WO CONTRAST  Result Date: 05/27/2021 CLINICAL DATA:  Neuro deficit, acute, stroke suspected. Left arm weakness and numbness. EXAM: MRI HEAD WITHOUT CONTRAST TECHNIQUE: Multiplanar, multiecho pulse sequences of the brain and surrounding structures were obtained without intravenous contrast. COMPARISON:  Head CT and CTA 05/27/2021 FINDINGS: Brain: There are small acute cortical and subcortical infarcts involving the right frontal and parietal lobes in the perirolandic region. A punctate focus of susceptibility artifact in the right frontoparietal region corresponds to a  calcification on CT. No intracranial hemorrhage, mass, midline shift, or extra-axial fluid collection is identified. T2 hyperintensities elsewhere in the cerebral white matter bilaterally are nonspecific but compatible with mild chronic small vessel ischemic disease. There are chronic lacunar infarcts in the right corona radiata and caudate body. There is mild-to-moderate cerebral atrophy. Vascular: Major intracranial vascular flow voids are preserved. Skull and upper cervical spine: Unremarkable bone marrow signal. Sinuses/Orbits: Right cataract extraction.  Complete opacification of the right frontal sinus. No significant mastoid fluid. Other: None. IMPRESSION: 1. 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 2. Mild chronic small vessel ischemic disease and cerebral atrophy. Electronically Signed   By: Sebastian AcheAllen  Grady M.D.   On: 05/27/2021 18:51   ECHOCARDIOGRAM COMPLETE  Result Date: 05/27/2021    ECHOCARDIOGRAM REPORT   Patient Name:   Tyler Burns Date of Exam: 05/27/2021 Medical Rec #:  [id number]     Height:       72.0 in Accession #:    [id number]    Weight:       236.0 lb Date of Birth:  07-14-1956     BSA:          2.286 m Patient Age:    65 years      BP:           180/121 mmHg Patient Gender: M             HR:           67 bpm. Exam Location:  Inpatient Procedure: 2D Echo, Cardiac Doppler, Color Doppler and Intracardiac            Opacification Agent Indications:    Stroke  History:        Patient has prior history of Echocardiogram examinations, most                 recent 10/10/2013. Arrythmias:Atrial Fibrillation; Risk                 Factors:Former Smoker. CKD. AAA.  Sonographer:    Ross LudwigArthur Guy RDCS (AE) Referring Phys: [id number] [facility] CenterMARCY PFEIFFER IMPRESSIONS  1. Left ventricular ejection fraction, by estimation, is 60 to 65%. The left ventricle has normal function. The left ventricle has no regional wall motion abnormalities. There is mild left ventricular hypertrophy. Left ventricular diastolic  parameters are indeterminate.  2. Right ventricular systolic function is normal. The right ventricular size is normal. Tricuspid regurgitation signal is inadequate for assessing PA pressure.  3. Left atrial size was mildly dilated.  4. The mitral valve is normal in structure. No evidence of mitral valve regurgitation. No evidence of mitral stenosis.  5. The aortic valve is tricuspid. Aortic valve regurgitation is not visualized. No aortic stenosis is present.  6. The inferior vena cava is dilated in size with >50% respiratory variability, suggesting right atrial pressure of 8 mmHg. FINDINGS  Left Ventricle: Left ventricular ejection fraction, by estimation, is 60 to 65%. The left ventricle has normal function. The left ventricle has no regional wall motion abnormalities. Definity contrast agent was given IV to delineate the left ventricular  endocardial borders. The left ventricular internal cavity size was normal in size. There is mild left ventricular hypertrophy. Left ventricular diastolic parameters are indeterminate. Right Ventricle: The right ventricular size is normal. No increase in right ventricular wall thickness. Right ventricular systolic function is normal. Tricuspid regurgitation signal is inadequate for assessing PA pressure. Left Atrium: Left atrial size was mildly dilated. Right Atrium: Right atrial size was normal in size. Pericardium: There is no evidence of pericardial effusion. Mitral Valve: The mitral valve is normal in structure. No evidence of mitral valve regurgitation. No evidence of mitral valve stenosis. MV peak gradient, 4.0 mmHg. The mean mitral valve gradient is 1.0 mmHg. Tricuspid Valve: The tricuspid valve is normal in structure. Tricuspid valve regurgitation is not demonstrated. Aortic Valve: The aortic valve is tricuspid. Aortic valve regurgitation is not visualized. No aortic  stenosis is present. Aortic valve mean gradient measures 2.0 mmHg. Aortic valve peak gradient measures 3.4  mmHg. Aortic valve area, by VTI measures 4.20 cm. Pulmonic Valve: The pulmonic valve was not well visualized. Pulmonic valve regurgitation is not visualized. Aorta: The aortic root and ascending aorta are structurally normal, with no evidence of dilitation. Venous: The inferior vena cava is dilated in size with greater than 50% respiratory variability, suggesting right atrial pressure of 8 mmHg. IAS/Shunts: The interatrial septum was not well visualized.  LEFT VENTRICLE PLAX 2D LVIDd:         4.90 cm LVIDs:         3.30 cm LV PW:         1.10 cm LV IVS:        1.30 cm LVOT diam:     2.40 cm LV SV:         71 LV SV Index:   31 LVOT Area:     4.52 cm  RIGHT VENTRICLE             IVC RV Basal diam:  3.90 cm     IVC diam: 2.20 cm RV S prime:     14.30 cm/s TAPSE (M-mode): 2.3 cm LEFT ATRIUM             Index       RIGHT ATRIUM           Index LA diam:        4.70 cm 2.06 cm/m  RA Area:     27.80 cm LA Vol (A2C):   89.9 ml 39.33 ml/m RA Volume:   91.50 ml  40.03 ml/m LA Vol (A4C):   69.8 ml 30.54 ml/m LA Biplane Vol: 83.0 ml 36.31 ml/m  AORTIC VALVE AV Area (Vmax):    4.18 cm AV Area (Vmean):   3.77 cm AV Area (VTI):     4.20 cm AV Vmax:           91.80 cm/s AV Vmean:          62.200 cm/s AV VTI:            0.170 m AV Peak Grad:      3.4 mmHg AV Mean Grad:      2.0 mmHg LVOT Vmax:         84.90 cm/s LVOT Vmean:        51.900 cm/s LVOT VTI:          0.158 m LVOT/AV VTI ratio: 0.93  AORTA Ao Root diam: 3.90 cm Ao Asc diam:  3.50 cm MITRAL VALVE MV Area (PHT): 4.07 cm  SHUNTS MV Area VTI:   2.91 cm  Systemic VTI:  0.16 m MV Peak grad:  4.0 mmHg  Systemic Diam: 2.40 cm MV Mean grad:  1.0 mmHg MV Vmax:       1.00 m/s MV Vmean:      54.9 cm/s Epifanio Lesches MD Electronically signed by Epifanio Lesches MD Signature Date/Time: 05/27/2021/5:55:47 PM    Final    VAS US CAROTID  Result Date: 05/28/2021 Carotid Arterial Duplex Study Patient Name:  Tyler Burns  Date of Exam:   05/28/2021 Medical Rec #:  [id number]      Accession #:    [id number] Date of Birth: 05-Mar-1956      Patient Gender: M Patient Age:   65Y Exam Location:  [facility] Procedure:      VAS US CAROTID Referring Phys: [id number] COURTNEY S HEARD --------------------------------------------------------------------------------  Indications:       Stenosis. Risk Factors:      Hypertension, hyperlipidemia. Comparison Study:  no prior Performing Technologist: Argentina Ponder RVS  Examination Guidelines: A complete evaluation includes B-mode imaging, spectral Doppler, color Doppler, and power Doppler as needed of all accessible portions of each vessel. Bilateral testing is considered an integral part of a complete examination. Limited examinations for reoccurring indications may be performed as noted.  Right Carotid Findings: +---------+--------+-------+--------+---------------------------------+--------+          PSV cm/sEDV    StenosisPlaque Description               Comments                  cm/s                                                     +---------+--------+-------+--------+---------------------------------+--------+ CCA Prox 61      15             heterogenous                              +---------+--------+-------+--------+---------------------------------+--------+ CCA      91      20             heterogenous                              Distal                                                                    +---------+--------+-------+--------+---------------------------------+--------+ ICA Prox 202     57     60-79%  heterogenous, calcific and                                                irregular                                 +---------+--------+-------+--------+---------------------------------+--------+ ICA Mid  127     28                                                       +---------+--------+-------+--------+---------------------------------+--------+ ICA       64      10                                                       Distal                                                                    +---------+--------+-------+--------+---------------------------------+--------+  ECA      118     12                                                       +---------+--------+-------+--------+---------------------------------+--------+ +----------+--------+-------+--------+-------------------+           PSV cm/sEDV cmsDescribeArm Pressure (mmHG) +----------+--------+-------+--------+-------------------+ [id number]                                         +----------+--------+-------+--------+-------------------+ +---------+--------+--+--------+--+---------+ VertebralPSV cm/s33EDV cm/s11Antegrade +---------+--------+--+--------+--+---------+  Left Carotid Findings: +----------+--------+--------+--------+------------------+--------+           PSV cm/sEDV cm/sStenosisPlaque DescriptionComments +----------+--------+--------+--------+------------------+--------+ CCA Prox  67      21              heterogenous               +----------+--------+--------+--------+------------------+--------+ CCA Distal64      23              heterogenous               +----------+--------+--------+--------+------------------+--------+ ICA Prox  86      23      1-39%   heterogenous               +----------+--------+--------+--------+------------------+--------+ ICA Distal70      24                                         +----------+--------+--------+--------+------------------+--------+ ECA       213     45                                         +----------+--------+--------+--------+------------------+--------+ +----------+--------+--------+--------+-------------------+           PSV cm/sEDV cm/sDescribeArm Pressure (mmHG) +----------+--------+--------+--------+-------------------+ [id number]                                           +----------+--------+--------+--------+-------------------+ +---------+--------+--+--------+--+---------+ VertebralPSV cm/s37EDV cm/s13Antegrade +---------+--------+--+--------+--+---------+   Summary: Right Carotid: Velocities in the right ICA are consistent with a 60-79%                stenosis. Left Carotid: Velocities in the left ICA are consistent with a 1-39% stenosis. Vertebrals: Bilateral vertebral arteries demonstrate antegrade flow. *See table(s) above for measurements and observations.     Preliminary      Signed: Carmel Sacramento, MD Internal Medicine Resident, PGY-1 [facility]  Pager: [phone] 4:56 PM, 05/28/2021

## 2021-05-31 ENCOUNTER — Other Ambulatory Visit: Payer: Self-pay

## 2021-06-03 NOTE — Progress Notes (Signed)
Surgical Instructions    Your procedure is scheduled on Wednesday, August 10th, 2022.  Report to [facility] Main Entrance "A" at 11:00 A.M., then check in with the Admitting office.  Call this number if you have problems the morning of surgery:  [phone]   If you have any questions prior to your surgery date call [phone]: Open Monday-Friday 8am-4pm    Remember:  Do not eat or drink after midnight the night before your surgery    Take these medicines the morning of surgery with A SIP OF WATER:  metoprolol succinate (TOPROL-XL)  RESTASIS 0.05 % ophthalmic emulsion  If needed:  brinzolamide (AZOPT)   Follow your surgeon's instructions on when to stop Aspirin and Plavix.  If no instructions were given by your surgeon then you will need to call the office to get those instructions.     As of today, STOP taking any Aspirin (unless otherwise instructed by your surgeon) Aleve, Naproxen, Ibuprofen, Motrin, Advil, Goody's, BC's, all herbal medications, fish oil, and all vitamins.          Do not wear jewelry  Do not wear lotions, powders, colognes, or deodorant. Men may shave face and neck. Do not bring valuables to the hospital. DO Not wear nail polish, gel polish, artificial nails, or any other type of covering on natural nails including finger and toenails. If patients have artificial nails, gel coating, etc. that need to be removed by a nail salon please have this removed prior to surgery or surgery may need to be canceled/delayed if the surgeon/ anesthesia feels like the patient is unable to be adequately monitored.             [facility] is not responsible for any belongings or valuables.  Do NOT Smoke (Tobacco/Vaping) or drink Alcohol 24 hours prior to your procedure If you use a CPAP at night, you may bring all equipment for your overnight stay.   Contacts, glasses, dentures or bridgework may not be worn into surgery, please bring cases for these belongings   For  patients admitted to the hospital, discharge time will be determined by your treatment team.   Patients discharged the day of surgery will not be allowed to drive home, and someone needs to stay with them for 24 hours.  ONLY 1 SUPPORT PERSON MAY BE PRESENT WHILE YOU ARE IN SURGERY. IF YOU ARE TO BE ADMITTED ONCE YOU ARE IN YOUR ROOM YOU WILL BE ALLOWED TWO (2) VISITORS.  Minor children may have two parents present. Special consideration for safety and communication needs will be reviewed on a case by case basis.  Special instructions:    Oral Hygiene is also important to reduce your risk of infection.  Remember - BRUSH YOUR TEETH THE MORNING OF SURGERY WITH YOUR REGULAR TOOTHPASTE   [facility]- Preparing For Surgery  Before surgery, you can play an important role. Because skin is not sterile, your skin needs to be as free of germs as possible. You can reduce the number of germs on your skin by washing with CHG (chlorahexidine gluconate) Soap before surgery.  CHG is an antiseptic cleaner which kills germs and bonds with the skin to continue killing germs even after washing.     Please do not use if you have an allergy to CHG or antibacterial soaps. If your skin becomes reddened/irritated stop using the CHG.  Do not shave (including legs and underarms) for at least 48 hours prior to first CHG shower. It is OK  to shave your face.  Please follow these instructions carefully.     Shower the NIGHT BEFORE SURGERY and the MORNING OF SURGERY with CHG Soap.   If you chose to wash your hair, wash your hair first as usual with your normal shampoo. After you shampoo, rinse your hair and body thoroughly to remove the shampoo.  Then [employer] and genitals (private parts) with your normal soap and rinse thoroughly to remove soap.  After that Use CHG Soap as you would any other liquid soap. You can apply CHG directly to the skin and wash gently with a scrungie or a clean washcloth.   Apply the CHG Soap to  your body ONLY FROM THE NECK DOWN.  Do not use on open wounds or open sores. Avoid contact with your eyes, ears, mouth and genitals (private parts). Wash Face and genitals (private parts)  with your normal soap.   Wash thoroughly, paying special attention to the area where your surgery will be performed.  Thoroughly rinse your body with warm water from the neck down.  DO NOT shower/wash with your normal soap after using and rinsing off the CHG Soap.  Pat yourself dry with a CLEAN TOWEL.  Wear CLEAN PAJAMAS to bed the night before surgery  Place CLEAN SHEETS on your bed the night before your surgery  DO NOT SLEEP WITH PETS.   Day of Surgery:  Take a shower with CHG soap. Wear Clean/Comfortable clothing the morning of surgery Do not apply any deodorants/lotions.   Remember to brush your teeth WITH YOUR REGULAR TOOTHPASTE.   Please read over the following fact sheets that you were given.

## 2021-06-04 ENCOUNTER — Encounter (HOSPITAL_COMMUNITY): Payer: Self-pay

## 2021-06-04 ENCOUNTER — Other Ambulatory Visit: Payer: Self-pay

## 2021-06-04 ENCOUNTER — Encounter (HOSPITAL_COMMUNITY)
Admission: RE | Admit: 2021-06-04 | Discharge: 2021-06-04 | Disposition: A | Discharge status: Home / Self Care | Payer: Medicare Other | Source: Ambulatory Visit | Attending: Vascular Surgery | Admitting: Vascular Surgery

## 2021-06-04 DIAGNOSIS — I4891 Unspecified atrial fibrillation: Secondary | ICD-10-CM | POA: Insufficient documentation

## 2021-06-04 DIAGNOSIS — I63231 Cerebral infarction due to unspecified occlusion or stenosis of right carotid arteries: Secondary | ICD-10-CM | POA: Insufficient documentation

## 2021-06-04 DIAGNOSIS — Z20822 Contact with and (suspected) exposure to covid-19: Secondary | ICD-10-CM | POA: Insufficient documentation

## 2021-06-04 DIAGNOSIS — Z87891 Personal history of nicotine dependence: Secondary | ICD-10-CM | POA: Insufficient documentation

## 2021-06-04 DIAGNOSIS — Z7902 Long term (current) use of antithrombotics/antiplatelets: Secondary | ICD-10-CM | POA: Insufficient documentation

## 2021-06-04 DIAGNOSIS — I10 Essential (primary) hypertension: Secondary | ICD-10-CM | POA: Insufficient documentation

## 2021-06-04 DIAGNOSIS — Z6832 Body mass index (BMI) 32.0-32.9, adult: Secondary | ICD-10-CM | POA: Insufficient documentation

## 2021-06-04 DIAGNOSIS — Z79899 Other long term (current) drug therapy: Secondary | ICD-10-CM | POA: Insufficient documentation

## 2021-06-04 DIAGNOSIS — Z01812 Encounter for preprocedural laboratory examination: Secondary | ICD-10-CM | POA: Insufficient documentation

## 2021-06-04 DIAGNOSIS — E7800 Pure hypercholesterolemia, unspecified: Secondary | ICD-10-CM | POA: Insufficient documentation

## 2021-06-04 DIAGNOSIS — Z7982 Long term (current) use of aspirin: Secondary | ICD-10-CM | POA: Insufficient documentation

## 2021-06-04 DIAGNOSIS — K219 Gastro-esophageal reflux disease without esophagitis: Secondary | ICD-10-CM | POA: Insufficient documentation

## 2021-06-04 DIAGNOSIS — H409 Unspecified glaucoma: Secondary | ICD-10-CM | POA: Insufficient documentation

## 2021-06-04 DIAGNOSIS — Z8679 Personal history of other diseases of the circulatory system: Secondary | ICD-10-CM | POA: Insufficient documentation

## 2021-06-04 DIAGNOSIS — E669 Obesity, unspecified: Secondary | ICD-10-CM | POA: Insufficient documentation

## 2021-06-04 HISTORY — DX: Cerebral infarction, unspecified: I63.9

## 2021-06-04 HISTORY — DX: Gastro-esophageal reflux disease without esophagitis: K21.9

## 2021-06-04 LAB — CBC
HCT: 49.5 % (ref 39.0–52.0)
Hemoglobin: 16.2 g/dL (ref 13.0–17.0)
MCH: 30.9 pg (ref 26.0–34.0)
MCHC: 32.7 g/dL (ref 30.0–36.0)
MCV: 94.5 fL (ref 80.0–100.0)
Platelets: 248 10*3/uL (ref 150–400)
RBC: 5.24 MIL/uL (ref 4.22–5.81)
RDW: 13.6 % (ref 11.5–15.5)
WBC: 8.7 10*3/uL (ref 4.0–10.5)
nRBC: 0 % (ref 0.0–0.2)

## 2021-06-04 LAB — SURGICAL PCR SCREEN
MRSA, PCR: NEGATIVE
Staphylococcus aureus: NEGATIVE

## 2021-06-04 LAB — PROTIME-INR
INR: 1.2 (ref 0.8–1.2)
Prothrombin Time: 14.9 seconds (ref 11.4–15.2)

## 2021-06-04 LAB — APTT: aPTT: 30 seconds (ref 24–36)

## 2021-06-04 LAB — SARS CORONAVIRUS 2 (TAT 6-24 HRS): SARS Coronavirus 2: NEGATIVE

## 2021-06-04 NOTE — Progress Notes (Addendum)
PCP - Quitman Livings, MD Cardiologist - Dr. Eldridge Dace  PPM/ICD - denies Device Orders - N/A Rep Notified - N/A  Chest x-ray - N/A EKG - 05/28/2021 Stress Test - 10/13/2018 ECHO - 05/27/2021 Cardiac Cath - denies  Sleep Study - denies CPAP - N/A  Fasting Blood Sugar - N/A  Blood Thinner Instructions: Per MD patient will continue to take Plavix and Aspirin with no stop on the day of surgery  Aspirin Instructions: Patient was instructed: As of today, STOP taking any Aspirin (unless otherwise instructed by your surgeon) Aleve, Naproxen, Ibuprofen, Motrin, Advil, Goody's, BC's, all herbal medications, fish oil, and all vitamins.  ERAS Protcol - no  COVID TEST- 06/04/2021   Anesthesia review: yes; cardiac history; recent CVA - August 2022  Patient denies shortness of breath, fever, cough and chest pain at PAT appointment   All instructions explained to the patient, with a verbal understanding of the material. Patient agrees to go over the instructions while at home for a better understanding. Patient also instructed to self quarantine after being tested for COVID-19. The opportunity to ask questions was provided.

## 2021-06-04 NOTE — Anesthesia Preprocedure Evaluation (Addendum)
Anesthesia Evaluation  Patient identified by MRN, date of birth, ID band Patient awake    Reviewed: Allergy & Precautions, NPO status , Patient's Chart, lab work & pertinent test results, reviewed documented beta blocker date and time   Airway Mallampati: II  TM Distance: >3 FB Neck ROM: Full    Dental  (+) Dental Advisory Given, Poor Dentition, Chipped, Edentulous Upper, Missing   Pulmonary asthma , former smoker,    Pulmonary exam normal breath sounds clear to auscultation       Cardiovascular hypertension, Pt. on home beta blockers (-) angina+ Peripheral Vascular Disease (Right carotid stenosis; s/p AAA repair)  Normal cardiovascular exam+ dysrhythmias Atrial Fibrillation  Rhythm:Regular Rate:Normal     Neuro/Psych CVA, Residual Symptoms    GI/Hepatic Neg liver ROS, GERD  ,  Endo/Other  Obesity   Renal/GU negative Renal ROS     Musculoskeletal negative musculoskeletal ROS (+)   Abdominal   Peds  Hematology negative hematology ROS (+)   Anesthesia Other Findings Day of surgery medications reviewed with the patient.  Reproductive/Obstetrics                           Anesthesia Physical Anesthesia Plan  ASA: 3  Anesthesia Plan: General   Post-op Pain Management:    Induction: Intravenous  PONV Risk Score and Plan: 2 and Dexamethasone and Ondansetron  Airway Management Planned: Oral ETT  Additional Equipment: Arterial line  Intra-op Plan:   Post-operative Plan: Extubation in OR  Informed Consent: I have reviewed the patients History and Physical, chart, labs and discussed the procedure including the risks, benefits and alternatives for the proposed anesthesia with the patient or authorized representative who has indicated his/her understanding and acceptance.     Dental advisory given  Plan Discussed with: CRNA  Anesthesia Plan Comments: (PAT note written 06/04/2021 by  Shonna Chock, PA-C. )       Anesthesia Quick Evaluation

## 2021-06-04 NOTE — Progress Notes (Signed)
Anesthesia Chart Review:  Case: [id number] Date/Time: 06/05/21 1245   Procedure: RIGHT TRANSCAROTID ARTERY REVASCULARIZATION (Right)   Anesthesia type: General   Pre-op diagnosis: Right Carotid Artery Stenosis with Stroke   [facility] ROOM 16 / [facility]   Surgeons: Cephus Shelling, MD       DISCUSSION: Patient is a 65 year old male scheduled for lumbar procedure. Patient admitted to [facility] 05/27/21-05/28/21 after presenting with left arm weakness with left facial numbness noted upon waking. Symptoms improved significantly within about two hours. Neurology consulted. Head CT negative for acute findings. MRI + for small acute right frontal and parietal infarcts. Carotid imaging showed > 80% RICA stenosis (by CTA). Seen by vascular surgeon Waverly Ferrari, MD for symptomatic RICA stenosis. Given high extension of the plaque, he was referred for TCAR rather than carotid endarterectomy. He was discharged on ASA, Plavix, and statin.   Other history includes former smoker (quit 10/27/68), HTN, hypercholesterolemia, afib (diagnosed ~ 2014), CVA (05/27/21), carotid artery stenosis, AAA (s/p open repair: Resection grafting with non-aortic aneurysm repair with 16 x 8 Hemashield aorto to bilateral common iliac artery bypass 05/25/19), GERD, glaucoma. BMI is consistent with obesity.  He has been followed by cardiologist Dr. Eldridge Dace since 2014 for afib. At the time of his diagnosis, CHADS2-VASc 1 and ASA used rather than anticoagulation therapy. Patient declined cardioversion. 2014 LVEF 45-50%, diffuse hypokinesis. 2016 stress test showed a small area of moderate inferoseptal ischemia, but overall felt low risk and managed medically. He had a non-ischemic stress test in 09/2018 prior to undergoing AAA repair. His last evaluation was on 11/01/19 by Manson Passey, PA. CHA2DS2-VASc score 2 then (HTN, age), and discussed transitioning to anticoagulation, but patient declined despite CVA risk and opted for ASA  therapy. One year follow-up had been planned. Echo on 05/27/21 showed LVEF 60-655, no LV regional wall motion abnormalities, normal RVSF, no significant valvular abnormalities.  He denied chest pain and shortness of breath at PAT RN visit.  06/04/21 presurgical COVID-19 test in process. Anesthesia team to evaluate on the day of surgery.  Patient to continue aspirin and Plavix for procedure.       VS: BP (!) 135/98   Pulse 70   Temp 36.5 C (Oral)   Resp 19   Ht 6' (1.829 m)   Wt 108 kg   SpO2 97%   BMI 32.28 kg/m   PROVIDERS: Quitman Livings, MD is PCP  Lance Muss, MD is cardiologist   LABS: Labs reviewed: Acceptable for surgery. BMET from 05/28/21 showed Na 135, K 4.0, glucose 138, BUN 14, Cr 1.10, calcium 8.8. LFTs normal and A1c 6.3% on 05/27/21.  (all labs ordered are listed, but only abnormal results are displayed)  Labs Reviewed  SURGICAL PCR SCREEN  SARS CORONAVIRUS 2 (TAT 6-24 HRS)  CBC  PROTIME-INR  APTT  URINALYSIS, ROUTINE W REFLEX MICROSCOPIC    IMAGES: MRI Brain 05/27/21: IMPRESSION: 1. 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 2. Mild chronic small vessel ischemic disease and cerebral atrophy.   CTA head/neck 05/27/21: IMPRESSION: - No intracranial large vessel occlusion. No correctable intracranial proximal stenosis. Distal vessels show atherosclerotic irregularity. - Aortic Atherosclerosis (ICD10-I70.0). - Atherosclerotic disease at both carotid bifurcations. Extensive primarily soft plaque at the bifurcation and ICA bulb on the right. ICA lumen 1 mm or less, indicating greater than 80% stenosis. - Atherosclerotic plaque at the left carotid bifurcation with maximal stenosis in the distal bulb measuring 50%. - Bilateral vertebral artery origin stenoses, 50% on  the right and 30-50% on the left.    EKG: 05/27/21: Atrial fibrillation at 84 bpm Low voltage, precordial leads agree, no sig change from previous Confirmed by Arby Barrette [phone]) on  05/27/2021 8:55:52 AM   CV: US Carotid 05/28/21: Summary:  - Right Carotid: Velocities in the right ICA are consistent with a 60-79% stenosis.  - Left Carotid: Velocities in the left ICA are consistent with a 1-39% stenosis.  - Vertebrals: Bilateral vertebral arteries demonstrate antegrade flow.    Echo 05/27/21: IMPRESSIONS   1. Left ventricular ejection fraction, by estimation, is 60 to 65%. The  left ventricle has normal function. The left ventricle has no regional  wall motion abnormalities. There is mild left ventricular hypertrophy.  Left ventricular diastolic parameters  are indeterminate.   2. Right ventricular systolic function is normal. The right ventricular  size is normal. Tricuspid regurgitation signal is inadequate for assessing  PA pressure.   3. Left atrial size was mildly dilated.   4. The mitral valve is normal in structure. No evidence of mitral valve  regurgitation. No evidence of mitral stenosis.   5. The aortic valve is tricuspid. Aortic valve regurgitation is not  visualized. No aortic stenosis is present.   6. The inferior vena cava is dilated in size with >50% respiratory  variability, suggesting right atrial pressure of 8 mmHg.    Nuclear stress test 10/13/18: Nuclear stress EF: 50%. No wall motion abnormality There was no ST segment deviation noted during stress. No perfusion defects. This is a low risk study. No ischemia identified.    Past Medical History:  Diagnosis Date   Asthma    Dysrhythmia    Afib   GERD (gastroesophageal reflux disease)    Glaucoma    High cholesterol    History of cardiovascular stress test    Myoview 12/19:  EF 50%, No ischemia, Low Risk   Hypertension    Stroke [facility])    August 2022    Past Surgical History:  Procedure Laterality Date   ABDOMINAL AORTIC ANEURYSM REPAIR N/A 05/25/2019   Procedure: ANEURYSM ABDOMINAL AORTIC REPAIR using a HEMASHIELD GOLD VASCULAR GRAFT;  Surgeon: Larina Earthly, MD;  Location: [facility]  OR;  Service: Vascular;  Laterality: N/A;   APPENDECTOMY     EYE SURGERY Right    cataract   INGUINAL HERNIA REPAIR Right 06/15/2015   Procedure: RIGHT INGUINAL HERNIA REPAIR WITH MESH;  Surgeon: Avel Peace, MD;  Location: [facility] OR;  Service: General;  Laterality: Right;   INSERTION OF MESH Right 06/15/2015   Procedure: INSERTION OF MESH;  Surgeon: Avel Peace, MD;  Location: [facility] OR;  Service: General;  Laterality: Right;   VASCULAR SURGERY      MEDICATIONS:  aspirin EC 81 MG tablet   atorvastatin (LIPITOR) 80 MG tablet   bimatoprost (LUMIGAN) 0.01 % SOLN   brinzolamide (AZOPT) 1 % ophthalmic suspension   cetirizine (ZYRTEC) 10 MG tablet   clopidogrel (PLAVIX) 75 MG tablet   metoprolol succinate (TOPROL-XL) 50 MG 24 hr tablet   Omega-3 Fatty Acids (FISH OIL) 600 MG CAPS   RESTASIS 0.05 % ophthalmic emulsion   vitamin E 400 UNIT capsule   No current facility-administered medications for this encounter.    Shonna Chock, PA-C Surgical Short Stay/Anesthesiology [facility] Phone [phone] [facility] Phone [phone] 06/04/2021 1:27 PM

## 2021-06-05 ENCOUNTER — Inpatient Hospital Stay (HOSPITAL_COMMUNITY)
Admission: RE | Admit: 2021-06-05 | Discharge: 2021-06-06 | DRG: 036 | Disposition: A | Discharge status: Home / Self Care | Payer: Medicare Other | Attending: Vascular Surgery | Admitting: Vascular Surgery

## 2021-06-05 ENCOUNTER — Encounter (HOSPITAL_COMMUNITY): Payer: Self-pay | Admitting: Vascular Surgery

## 2021-06-05 ENCOUNTER — Inpatient Hospital Stay (HOSPITAL_COMMUNITY): Payer: Medicare Other | Admitting: Vascular Surgery

## 2021-06-05 ENCOUNTER — Inpatient Hospital Stay (HOSPITAL_COMMUNITY): Payer: Medicare Other | Admitting: Anesthesiology

## 2021-06-05 ENCOUNTER — Inpatient Hospital Stay (HOSPITAL_COMMUNITY): Payer: Medicare Other

## 2021-06-05 ENCOUNTER — Other Ambulatory Visit: Payer: Self-pay

## 2021-06-05 ENCOUNTER — Encounter (HOSPITAL_COMMUNITY)
Admission: RE | Disposition: A | Discharge status: Home / Self Care | Payer: Self-pay | Source: Home / Self Care | Attending: Vascular Surgery

## 2021-06-05 DIAGNOSIS — H409 Unspecified glaucoma: Secondary | ICD-10-CM | POA: Diagnosis present

## 2021-06-05 DIAGNOSIS — Z806 Family history of leukemia: Secondary | ICD-10-CM

## 2021-06-05 DIAGNOSIS — I69398 Other sequelae of cerebral infarction: Secondary | ICD-10-CM | POA: Diagnosis not present

## 2021-06-05 DIAGNOSIS — I6521 Occlusion and stenosis of right carotid artery: Principal | ICD-10-CM | POA: Diagnosis present

## 2021-06-05 DIAGNOSIS — Z20822 Contact with and (suspected) exposure to covid-19: Secondary | ICD-10-CM | POA: Diagnosis present

## 2021-06-05 DIAGNOSIS — Z888 Allergy status to other drugs, medicaments and biological substances status: Secondary | ICD-10-CM

## 2021-06-05 DIAGNOSIS — I10 Essential (primary) hypertension: Secondary | ICD-10-CM | POA: Diagnosis present

## 2021-06-05 DIAGNOSIS — Z8249 Family history of ischemic heart disease and other diseases of the circulatory system: Secondary | ICD-10-CM

## 2021-06-05 DIAGNOSIS — Z87891 Personal history of nicotine dependence: Secondary | ICD-10-CM | POA: Diagnosis not present

## 2021-06-05 DIAGNOSIS — Z006 Encounter for examination for normal comparison and control in clinical research program: Secondary | ICD-10-CM | POA: Diagnosis not present

## 2021-06-05 DIAGNOSIS — E669 Obesity, unspecified: Secondary | ICD-10-CM | POA: Diagnosis present

## 2021-06-05 DIAGNOSIS — Z7902 Long term (current) use of antithrombotics/antiplatelets: Secondary | ICD-10-CM | POA: Diagnosis not present

## 2021-06-05 DIAGNOSIS — Z6832 Body mass index (BMI) 32.0-32.9, adult: Secondary | ICD-10-CM

## 2021-06-05 DIAGNOSIS — Z8679 Personal history of other diseases of the circulatory system: Secondary | ICD-10-CM | POA: Diagnosis not present

## 2021-06-05 DIAGNOSIS — K219 Gastro-esophageal reflux disease without esophagitis: Secondary | ICD-10-CM | POA: Diagnosis present

## 2021-06-05 DIAGNOSIS — R200 Anesthesia of skin: Secondary | ICD-10-CM | POA: Diagnosis present

## 2021-06-05 DIAGNOSIS — E7800 Pure hypercholesterolemia, unspecified: Secondary | ICD-10-CM | POA: Diagnosis present

## 2021-06-05 DIAGNOSIS — I6529 Occlusion and stenosis of unspecified carotid artery: Secondary | ICD-10-CM | POA: Diagnosis present

## 2021-06-05 HISTORY — PX: TRANSCAROTID ARTERY REVASCULARIZATIONÂ: SHX6778

## 2021-06-05 LAB — URINALYSIS, ROUTINE W REFLEX MICROSCOPIC
Bilirubin Urine: NEGATIVE
Glucose, UA: NEGATIVE mg/dL
Hgb urine dipstick: NEGATIVE
Ketones, ur: NEGATIVE mg/dL
Leukocytes,Ua: NEGATIVE
Nitrite: NEGATIVE
Protein, ur: NEGATIVE mg/dL
Specific Gravity, Urine: 1.015 (ref 1.005–1.030)
pH: 5 (ref 5.0–8.0)

## 2021-06-05 SURGERY — TRANSCAROTID ARTERY REVASCULARIZATION (TCAR)
Anesthesia: General | Site: Neck | Laterality: Right

## 2021-06-05 MED ORDER — ALUM & MAG HYDROXIDE-SIMETH 200-200-20 MG/5ML PO SUSP: 15.0000 mL | ORAL | Status: DC | PRN

## 2021-06-05 MED ORDER — DOCUSATE SODIUM 100 MG PO CAPS
100.0000 mg | ORAL_CAPSULE | Freq: Every day | ORAL | Status: DC
  Filled 2021-06-05: qty 1

## 2021-06-05 MED ORDER — HEPARIN SODIUM (PORCINE) 1000 UNIT/ML IJ SOLN
INTRAMUSCULAR | Status: DC | PRN
  Administered 2021-06-05: 8000 [IU] via INTRAVENOUS

## 2021-06-05 MED ORDER — CHLORHEXIDINE GLUCONATE CLOTH 2 % EX PADS: 6.0000 | MEDICATED_PAD | Freq: Once | CUTANEOUS | Status: DC

## 2021-06-05 MED ORDER — HEPARIN 6000 UNIT IRRIGATION SOLUTION
Status: AC
  Filled 2021-06-05: qty 500

## 2021-06-05 MED ORDER — ONDANSETRON HCL 4 MG/2ML IJ SOLN: 4.0000 mg | Freq: Four times a day (QID) | INTRAMUSCULAR | Status: DC | PRN

## 2021-06-05 MED ORDER — PROTAMINE SULFATE 10 MG/ML IV SOLN
INTRAVENOUS | Status: DC | PRN
  Administered 2021-06-05: 50 mg via INTRAVENOUS

## 2021-06-05 MED ORDER — 0.9 % SODIUM CHLORIDE (POUR BTL) OPTIME
TOPICAL | Status: DC | PRN
  Administered 2021-06-05: 1000 mL

## 2021-06-05 MED ORDER — ROCURONIUM BROMIDE 100 MG/10ML IV SOLN
INTRAVENOUS | Status: DC | PRN
  Administered 2021-06-05: 60 mg via INTRAVENOUS

## 2021-06-05 MED ORDER — EPHEDRINE SULFATE 50 MG/ML IJ SOLN
INTRAMUSCULAR | Status: DC | PRN
  Administered 2021-06-05: 10 mg via INTRAVENOUS

## 2021-06-05 MED ORDER — SODIUM CHLORIDE 0.9 % IV SOLN: INTRAVENOUS | Status: DC

## 2021-06-05 MED ORDER — PHENOL 1.4 % MT LIQD: 1.0000 | OROMUCOSAL | Status: DC | PRN

## 2021-06-05 MED ORDER — LACTATED RINGERS IV SOLN: INTRAVENOUS | Status: DC

## 2021-06-05 MED ORDER — GUAIFENESIN-DM 100-10 MG/5ML PO SYRP: 15.0000 mL | ORAL_SOLUTION | ORAL | Status: DC | PRN

## 2021-06-05 MED ORDER — POTASSIUM CHLORIDE CRYS ER 20 MEQ PO TBCR: 20.0000 meq | EXTENDED_RELEASE_TABLET | Freq: Every day | ORAL | Status: DC | PRN

## 2021-06-05 MED ORDER — LIDOCAINE HCL 1 % IJ SOLN
INTRAMUSCULAR | Status: AC
  Filled 2021-06-05: qty 20

## 2021-06-05 MED ORDER — LIDOCAINE 2% (20 MG/ML) 5 ML SYRINGE
INTRAMUSCULAR | Status: DC | PRN
  Administered 2021-06-05: 100 mg via INTRAVENOUS

## 2021-06-05 MED ORDER — ATORVASTATIN CALCIUM 80 MG PO TABS
80.0000 mg | ORAL_TABLET | Freq: Every day | ORAL | Status: DC
  Administered 2021-06-05: 80 mg via ORAL
  Filled 2021-06-05: qty 1

## 2021-06-05 MED ORDER — CYCLOSPORINE 0.05 % OP EMUL
1.0000 [drp] | Freq: Two times a day (BID) | OPHTHALMIC | Status: DC
  Filled 2021-06-05 (×3): qty 1

## 2021-06-05 MED ORDER — LATANOPROST 0.005 % OP SOLN
1.0000 [drp] | Freq: Every day | OPHTHALMIC | Status: DC
  Administered 2021-06-05: 1 [drp] via OPHTHALMIC
  Filled 2021-06-05: qty 2.5

## 2021-06-05 MED ORDER — IODIXANOL 320 MG/ML IV SOLN
INTRAVENOUS | Status: DC | PRN
  Administered 2021-06-05: 16 mL via INTRA_ARTERIAL

## 2021-06-05 MED ORDER — HEMOSTATIC AGENTS (NO CHARGE) OPTIME
TOPICAL | Status: DC | PRN
  Administered 2021-06-05: 1 via TOPICAL

## 2021-06-05 MED ORDER — METOPROLOL SUCCINATE ER 50 MG PO TB24
50.0000 mg | ORAL_TABLET | Freq: Every day | ORAL | Status: DC
  Administered 2021-06-05: 50 mg via ORAL
  Filled 2021-06-05: qty 1

## 2021-06-05 MED ORDER — OXYCODONE-ACETAMINOPHEN 5-325 MG PO TABS: 1.0000 | ORAL_TABLET | ORAL | Status: DC | PRN

## 2021-06-05 MED ORDER — GLYCOPYRROLATE 0.2 MG/ML IJ SOLN
INTRAMUSCULAR | Status: DC | PRN
  Administered 2021-06-05: .2 mg via INTRAVENOUS

## 2021-06-05 MED ORDER — ASPIRIN EC 81 MG PO TBEC
81.0000 mg | DELAYED_RELEASE_TABLET | Freq: Every day | ORAL | Status: DC
  Administered 2021-06-06: 81 mg via ORAL
  Filled 2021-06-05: qty 1

## 2021-06-05 MED ORDER — ONDANSETRON HCL 4 MG/2ML IJ SOLN
INTRAMUSCULAR | Status: DC | PRN
  Administered 2021-06-05: 4 mg via INTRAVENOUS

## 2021-06-05 MED ORDER — HYDRALAZINE HCL 20 MG/ML IJ SOLN: 5.0000 mg | INTRAMUSCULAR | Status: DC | PRN

## 2021-06-05 MED ORDER — ORAL CARE MOUTH RINSE: 15.0000 mL | Freq: Once | OROMUCOSAL | Status: AC

## 2021-06-05 MED ORDER — SUGAMMADEX SODIUM 200 MG/2ML IV SOLN
INTRAVENOUS | Status: DC | PRN
  Administered 2021-06-05: 200 mg via INTRAVENOUS

## 2021-06-05 MED ORDER — MORPHINE SULFATE (PF) 2 MG/ML IV SOLN: 2.0000 mg | INTRAVENOUS | Status: DC | PRN

## 2021-06-05 MED ORDER — CEFAZOLIN SODIUM-DEXTROSE 2-4 GM/100ML-% IV SOLN
2.0000 g | Freq: Three times a day (TID) | INTRAVENOUS | Status: AC
Start: 2021-06-05 — End: 2021-06-06
  Administered 2021-06-05 – 2021-06-06 (×2): 2 g via INTRAVENOUS
  Filled 2021-06-05 (×2): qty 100

## 2021-06-05 MED ORDER — POLYETHYLENE GLYCOL 3350 17 G PO PACK: 17.0000 g | PACK | Freq: Every day | ORAL | Status: DC | PRN

## 2021-06-05 MED ORDER — METOPROLOL TARTRATE 5 MG/5ML IV SOLN: 2.0000 mg | INTRAVENOUS | Status: DC | PRN

## 2021-06-05 MED ORDER — ROCURONIUM BROMIDE 10 MG/ML (PF) SYRINGE
PREFILLED_SYRINGE | INTRAVENOUS | Status: AC
  Filled 2021-06-05: qty 10

## 2021-06-05 MED ORDER — CLOPIDOGREL BISULFATE 75 MG PO TABS
75.0000 mg | ORAL_TABLET | Freq: Every day | ORAL | Status: DC
  Administered 2021-06-06: 75 mg via ORAL
  Filled 2021-06-05: qty 1

## 2021-06-05 MED ORDER — FENTANYL CITRATE (PF) 100 MCG/2ML IJ SOLN: 25.0000 ug | INTRAMUSCULAR | Status: DC | PRN

## 2021-06-05 MED ORDER — ACETAMINOPHEN 325 MG PO TABS
325.0000 mg | ORAL_TABLET | ORAL | Status: DC | PRN
Start: 2021-06-05 — End: 2021-06-06
  Administered 2021-06-06: 650 mg via ORAL
  Filled 2021-06-05: qty 2

## 2021-06-05 MED ORDER — CHLORHEXIDINE GLUCONATE 0.12 % MT SOLN
15.0000 mL | Freq: Once | OROMUCOSAL | Status: AC
  Administered 2021-06-05: 15 mL via OROMUCOSAL
  Filled 2021-06-05: qty 15

## 2021-06-05 MED ORDER — ACETAMINOPHEN 500 MG PO TABS
1000.0000 mg | ORAL_TABLET | Freq: Once | ORAL | Status: AC
  Administered 2021-06-05: 1000 mg via ORAL
  Filled 2021-06-05: qty 2

## 2021-06-05 MED ORDER — HEPARIN 6000 UNIT IRRIGATION SOLUTION
Status: DC | PRN
  Administered 2021-06-05: 1

## 2021-06-05 MED ORDER — LABETALOL HCL 5 MG/ML IV SOLN
INTRAVENOUS | Status: DC | PRN
  Administered 2021-06-05: 5 mg via INTRAVENOUS

## 2021-06-05 MED ORDER — FENTANYL CITRATE (PF) 250 MCG/5ML IJ SOLN
INTRAMUSCULAR | Status: AC
  Filled 2021-06-05: qty 5

## 2021-06-05 MED ORDER — LIDOCAINE HCL (PF) 1 % IJ SOLN
INTRAMUSCULAR | Status: AC
  Filled 2021-06-05: qty 30

## 2021-06-05 MED ORDER — PANTOPRAZOLE SODIUM 40 MG PO TBEC
40.0000 mg | DELAYED_RELEASE_TABLET | Freq: Every day | ORAL | Status: DC
  Administered 2021-06-05 – 2021-06-06 (×2): 40 mg via ORAL
  Filled 2021-06-05 (×2): qty 1

## 2021-06-05 MED ORDER — DEXAMETHASONE SODIUM PHOSPHATE 10 MG/ML IJ SOLN
INTRAMUSCULAR | Status: DC | PRN
  Administered 2021-06-05: 4 mg via INTRAVENOUS

## 2021-06-05 MED ORDER — LIDOCAINE 2% (20 MG/ML) 5 ML SYRINGE
INTRAMUSCULAR | Status: AC
  Filled 2021-06-05: qty 5

## 2021-06-05 MED ORDER — MAGNESIUM SULFATE 2 GM/50ML IV SOLN
2.0000 g | Freq: Every day | INTRAVENOUS | Status: DC | PRN
Start: 2021-06-05 — End: 2021-06-06

## 2021-06-05 MED ORDER — DEXAMETHASONE SODIUM PHOSPHATE 10 MG/ML IJ SOLN
INTRAMUSCULAR | Status: AC
  Filled 2021-06-05: qty 1

## 2021-06-05 MED ORDER — CEFAZOLIN SODIUM-DEXTROSE 2-4 GM/100ML-% IV SOLN
2.0000 g | INTRAVENOUS | Status: AC
  Administered 2021-06-05: 2 g via INTRAVENOUS
  Filled 2021-06-05: qty 100

## 2021-06-05 MED ORDER — PROPOFOL 10 MG/ML IV BOLUS
INTRAVENOUS | Status: DC | PRN
  Administered 2021-06-05: 20 mg via INTRAVENOUS
  Administered 2021-06-05: 100 mg via INTRAVENOUS
  Administered 2021-06-05: 80 mg via INTRAVENOUS

## 2021-06-05 MED ORDER — PHENYLEPHRINE 40 MCG/ML (10ML) SYRINGE FOR IV PUSH (FOR BLOOD PRESSURE SUPPORT)
PREFILLED_SYRINGE | INTRAVENOUS | Status: DC | PRN
  Administered 2021-06-05 (×2): 80 ug via INTRAVENOUS

## 2021-06-05 MED ORDER — SODIUM CHLORIDE 0.9 % IV SOLN: 500.0000 mL | Freq: Once | INTRAVENOUS | Status: DC | PRN

## 2021-06-05 MED ORDER — ONDANSETRON HCL 4 MG/2ML IJ SOLN: 4.0000 mg | Freq: Once | INTRAMUSCULAR | Status: DC | PRN

## 2021-06-05 MED ORDER — BRINZOLAMIDE 1 % OP SUSP
1.0000 [drp] | Freq: Two times a day (BID) | OPHTHALMIC | Status: DC
  Administered 2021-06-05 – 2021-06-06 (×2): 1 [drp] via OPHTHALMIC
  Filled 2021-06-05: qty 10

## 2021-06-05 MED ORDER — ONDANSETRON HCL 4 MG/2ML IJ SOLN
INTRAMUSCULAR | Status: AC
  Filled 2021-06-05: qty 2

## 2021-06-05 MED ORDER — PHENYLEPHRINE HCL-NACL 20-0.9 MG/250ML-% IV SOLN
INTRAVENOUS | Status: DC | PRN
  Administered 2021-06-05: 25 ug/min via INTRAVENOUS

## 2021-06-05 MED ORDER — LABETALOL HCL 5 MG/ML IV SOLN: 10.0000 mg | INTRAVENOUS | Status: DC | PRN

## 2021-06-05 MED ORDER — ACETAMINOPHEN 650 MG RE SUPP: 325.0000 mg | RECTAL | Status: DC | PRN

## 2021-06-05 MED ORDER — BISACODYL 10 MG RE SUPP: 10.0000 mg | Freq: Every day | RECTAL | Status: DC | PRN

## 2021-06-05 MED ORDER — FENTANYL CITRATE (PF) 100 MCG/2ML IJ SOLN
INTRAMUSCULAR | Status: DC | PRN
  Administered 2021-06-05 (×3): 50 ug via INTRAVENOUS

## 2021-06-05 SURGICAL SUPPLY — 62 items
ADH SKN CLS APL DERMABOND .7 (GAUZE/BANDAGES/DRESSINGS) ×2
BAG BANDED W/RUBBER/TAPE 36X54 (MISCELLANEOUS) ×3 IMPLANT
BAG COUNTER SPONGE SURGICOUNT (BAG) ×2 IMPLANT
BAG EQP BAND 135X91 W/RBR TAPE (MISCELLANEOUS) ×1
BAG SPNG CNTER NS LX DISP (BAG) ×1
BAG SURGICOUNT SPONGE COUNTING (BAG) ×1
BALLN STERLING RX 6X30X80 (BALLOONS) ×3
BALLOON STERLING RX 6X30X80 (BALLOONS) IMPLANT
CANISTER SUCT 3000ML PPV (MISCELLANEOUS) ×3 IMPLANT
CATH BEACON 5 .035 40 KMP TP (CATHETERS) IMPLANT
CATH BEACON 5 .038 40 KMP TP (CATHETERS) ×3
CATH ROBINSON RED A/P 18FR (CATHETERS) IMPLANT
CLIP VESOCCLUDE MED 6/CT (CLIP) ×3 IMPLANT
CLIP VESOCCLUDE SM WIDE 6/CT (CLIP) ×3 IMPLANT
COVER DOME SNAP 22 D (MISCELLANEOUS) ×3 IMPLANT
COVER PROBE W GEL 5X96 (DRAPES) ×3 IMPLANT
DERMABOND ADVANCED (GAUZE/BANDAGES/DRESSINGS) ×4
DERMABOND ADVANCED .7 DNX12 (GAUZE/BANDAGES/DRESSINGS) ×1 IMPLANT
DRAPE FEMORAL ANGIO 80X135IN (DRAPES) ×3 IMPLANT
ELECT REM PT RETURN 9FT ADLT (ELECTROSURGICAL) ×3
ELECTRODE REM PT RTRN 9FT ADLT (ELECTROSURGICAL) ×1 IMPLANT
GLOVE SURG ENC MOIS LTX SZ7.5 (GLOVE) ×3 IMPLANT
GOWN STRL REUS W/ TWL LRG LVL3 (GOWN DISPOSABLE) ×2 IMPLANT
GOWN STRL REUS W/ TWL XL LVL3 (GOWN DISPOSABLE) ×1 IMPLANT
GOWN STRL REUS W/TWL LRG LVL3 (GOWN DISPOSABLE) ×6
GOWN STRL REUS W/TWL XL LVL3 (GOWN DISPOSABLE) ×3
HEMOSTAT SNOW SURGICEL 2X4 (HEMOSTASIS) IMPLANT
INTRODUCER KIT GALT 7CM (INTRODUCER) ×3
KIT BASIN OR (CUSTOM PROCEDURE TRAY) ×3 IMPLANT
KIT ENCORE 26 ADVANTAGE (KITS) ×3 IMPLANT
KIT INTRODUCER GALT 7 (INTRODUCER) ×1 IMPLANT
KIT TURNOVER KIT B (KITS) ×3 IMPLANT
MARKER PEN SURG W/LABELS BLK (STERILIZATION PRODUCTS) ×2 IMPLANT
NDL HYPO 25GX1X1/2 BEV (NEEDLE) IMPLANT
NEEDLE HYPO 25GX1X1/2 BEV (NEEDLE) IMPLANT
PACK CAROTID (CUSTOM PROCEDURE TRAY) ×3 IMPLANT
POSITIONER HEAD DONUT 9IN (MISCELLANEOUS) ×3 IMPLANT
PROTECTION STATION PRESSURIZED (MISCELLANEOUS) ×3
SET MICROPUNCTURE 5F STIFF (MISCELLANEOUS) ×3 IMPLANT
SHEATH PINNACLE 5FR (SHEATH) IMPLANT
SHUNT CAROTID BYPASS 10 (VASCULAR PRODUCTS) IMPLANT
SHUNT CAROTID BYPASS 12FRX15.5 (VASCULAR PRODUCTS) IMPLANT
STATION PROTECTION PRESSURIZED (MISCELLANEOUS) ×1 IMPLANT
STENT TRANSCAROTID SYSTEM 9X40 (Permanent Stent) ×2 IMPLANT
SUT MNCRL AB 4-0 PS2 18 (SUTURE) ×3 IMPLANT
SUT PROLENE 5 0 C 1 24 (SUTURE) ×3 IMPLANT
SUT PROLENE 6 0 BV (SUTURE) IMPLANT
SUT PROLENE 7 0 BV 1 (SUTURE) IMPLANT
SUT SILK 2 0 PERMA HAND 18 BK (SUTURE) IMPLANT
SUT SILK 2 0 SH CR/8 (SUTURE) ×1 IMPLANT
SUT SILK 3 0 (SUTURE)
SUT SILK 3-0 18XBRD TIE 12 (SUTURE) IMPLANT
SUT VIC AB 3-0 SH 27 (SUTURE) ×3
SUT VIC AB 3-0 SH 27X BRD (SUTURE) ×1 IMPLANT
SYR 10ML LL (SYRINGE) ×9 IMPLANT
SYR 20ML LL LF (SYRINGE) ×3 IMPLANT
SYR CONTROL 10ML LL (SYRINGE) IMPLANT
SYSTEM TRANSCAROTID NEUROPRTCT (MISCELLANEOUS) ×1 IMPLANT
TOWEL GREEN STERILE (TOWEL DISPOSABLE) ×3 IMPLANT
TRANSCAROTID NEUROPROTECT SYS (MISCELLANEOUS) ×3
WATER STERILE IRR 1000ML POUR (IV SOLUTION) ×3 IMPLANT
WIRE BENTSON .035X145CM (WIRE) ×3 IMPLANT

## 2021-06-05 NOTE — Transfer of Care (Signed)
Immediate Anesthesia Transfer of Care Note  Patient: Tyler Burns  Procedure(s) Performed: RIGHT TRANSCAROTID ARTERY REVASCULARIZATION (Right: Neck)  Patient Location: PACU  Anesthesia Type:General  Level of Consciousness: awake, alert , oriented and patient cooperative  Airway & Oxygen Therapy: Patient Spontanous Breathing  Post-op Assessment: Report given to RN, Post -op Vital signs reviewed and stable and Patient moving all extremities X 4  Post vital signs: Reviewed and stable  Last Vitals:  Vitals Value Taken Time  BP 116/71 06/05/21 1631  Temp    Pulse 86 06/05/21 1631  Resp 15 06/05/21 1631  SpO2 96 % 06/05/21 1631  Vitals shown include unvalidated device data.  Last Pain:  Vitals:   06/05/21 1135  TempSrc:   PainSc: 0-No pain         Complications: No notable events documented.

## 2021-06-05 NOTE — Discharge Instructions (Signed)
   [facility]  Discharge Instructions   Carotid Surgery  Please refer to the following instructions for your post-procedure care. Your surgeon or physician assistant will discuss any changes with you.  Activity  You are encouraged to walk as much as you can. You can slowly return to normal activities but must avoid strenuous activity and heavy lifting until your doctor tell you it's okay. Avoid activities such as vacuuming or swinging a golf club. You can drive after one week if you are comfortable and you are no longer taking prescription pain medications. It is normal to feel tired for serval weeks after your surgery. It is also normal to have difficulty with sleep habits, eating, and bowel movements after surgery. These will go away with time.  Bathing/Showering  Shower daily after you go home. Do not soak in a bathtub, hot tub, or swim until the incision heals completely.  Incision Care  Shower every day. Clean your incision with mild soap and water. Pat the area dry with a clean towel. You do not need a bandage unless otherwise instructed. Do not apply any ointments or creams to your incision. You may have skin glue on your incision. Do not peel it off. It will come off on its own in about one week. Your incision may feel thickened and raised for several weeks after your surgery. This is normal and the skin will soften over time.   For Men Only: It's okay to shave around the incision but do not shave the incision itself for 2 weeks. It is common to have numbness under your chin that could last for several months.  Diet  Resume your normal diet. There are no special food restrictions following this procedure. A low fat/low cholesterol diet is recommended for all patients with vascular disease. In order to heal from your surgery, it is CRITICAL to get adequate nutrition. Your body requires vitamins, minerals, and protein. Vegetables are the best source of  vitamins and minerals. Vegetables also provide the perfect balance of protein. Processed food has little nutritional value, so try to avoid this.  Medications  Resume taking all of your medications unless your doctor or physician assistant tells you not to. If your incision is causing pain, you may take over-the- counter pain relievers such as acetaminophen (Tylenol). If you were prescribed a stronger pain medication, please be aware these medications can cause nausea and constipation. Prevent nausea by taking the medication with a snack or meal. Avoid constipation by drinking plenty of fluids and eating foods with a high amount of fiber, such as fruits, vegetables, and grains.   Do not take Tylenol if you are taking prescription pain medications.  Follow Up  Our office will schedule a follow up appointment 2-3 weeks following discharge.  Please call us immediately for any of the following conditions  . Increased pain, redness, drainage (pus) from your incision site. . Fever of 101 degrees or higher. . If you should develop stroke (slurred speech, difficulty swallowing, weakness on one side of your body, loss of vision) you should call 911 and go to the nearest emergency room. .  Reduce your risk of vascular disease:  . Stop smoking. If you would like help call [facility] at 1-800-QUIT-NOW (1-800-784-8669) or [facility] at 336-586-4000. . Manage your cholesterol . Maintain a desired weight . Control your diabetes . Keep your blood pressure down .  If you have any questions, please call the office at [phone]. 

## 2021-06-05 NOTE — Anesthesia Procedure Notes (Signed)
Arterial Line Insertion Start/End8/07/2021 11:55 AM, 06/05/2021 12:05 PM Performed by: Cecile Hearing, MD, Marny Lowenstein, CRNA, CRNA  Patient location: Pre-op. Preanesthetic checklist: patient identified, IV checked, site marked, risks and benefits discussed, surgical consent, monitors and equipment checked, pre-op evaluation, timeout performed and anesthesia consent Lidocaine 1% used for infiltration and patient sedated Right, radial was placed Catheter size: 20 G Hand hygiene performed , maximum sterile barriers used  and Seldinger technique used Allen's test indicative of satisfactory collateral circulation Attempts: 2 Procedure performed using ultrasound guided technique. Ultrasound Notes:anatomy identified, needle tip was noted to be adjacent to the nerve/plexus identified and no ultrasound evidence of intravascular and/or intraneural injection Following insertion, dressing applied and Biopatch. Post procedure assessment: normal  Patient tolerated the procedure well with no immediate complications.

## 2021-06-05 NOTE — Op Note (Signed)
Date: June 05, 2021  Preoperative diagnosis: High-grade symptomatic right internal carotid artery stenosis (greater than 80%)  Postoperative diagnosis: Same  Procedure: 1.  Ultrasound-guided access of left common femoral vein 2.  Right transcatheter placement of cervical carotid artery stent including angioplasty with distal embolic protection (right TCAR)  Surgeon: Dr. Cephus Shelling, MD  Assistant: Doreatha Massed, PA  Indication: Patient is a 65 year old male who was seen last week with a small right brain stroke with evidence of a high-grade right internal carotid artery stenosis.  He was evaluated Dr. Edilia Bo who felt his lesion was high and recommended TCAR.  He presents today after risk benefits discussed.  Findings: Initial left common femoral vein access for placement of venous flow reversal sheath.  After cutdown on the right common carotid artery just above the clavicle the arterial sheath was placed and ultimately the lesion was identified.  We were able to cross the lesion with an 014 wire with flow reversal for neuro protection and then performed a balloon angioplasty of the right internal carotid artery with a 6 mm x 30 mm balloon and this was stented with a 9 mm x 40 mm En route.  No evidence of significant residual stenosis.  Total flow reversal was 7 minutes.  Anesthesia: General  Details: Patient was taken to the operating room after informed consent was obtained.  Placed on the operative table in the supine position.  General endotracheal anesthesia was induced.  Bilateral groins and the right neck were then prepped and draped in standard sterile fashion.  Antibiotics were given timeout was performed.  Initially evaluated the left common femoral vein with ultrasound and it was patent and image was saved.  This was accessed with a micro access needle under ultrasound guidance placed a microwire and then a micro sheath.  I then placed a Bentson wire and the venous TCAR  flow reversal sheath.  I then went to the right neck where a transverse incision was made 1 fingerbreadth above the clavicle.  We then performed a muscle-sparing technique between the heads of the sternocleidomastoid after the platysma was opened.  I mobilized the jugular vein lateral to the artery and identify the vagus nerve that was preserved.  We then dissected out the common carotid just above the clavicle and I got a vessel loop as well as an umbilical tape around the base of the artery.  Patient was given 100 units/kg IV heparin.  ACT was checked to maintain greater than 250.  A 5-0 Prolene pursestring was made in the anterior wall of the common carotid artery.  I then accessed this with a micro access needle, placed a microwire, and then a micro sheath.  We got a carotid angiogram and identified the carotid bifurcation.  I then inserted a J-wire to just below the carotid bifurcation and then the arterial TCAR sheath was inserted.  We then connected the filter system and had good passive flow reversal.  At that point time we confirmed a therapeutic ACT and did a TCAR timeout.  We clamped the common carotid before the below the arterial sheath.  We went on active flow reversal.  The lesion was then crossed with an 014 wire into the internal carotid artery and then we performed primary balloon angioplasty with a 6 mm x 30 mm angioplasty balloon.  We then placed a 9 mm x 40 mm Enroute stent.  We then allowed 2 minutes of active flow reversal.  Total flow reversal time was 7  minutes and then we came off clamp.  We then disconnected the filter.  The arterial sheath was removed and the pursestring was tied down.  I did put an additional 5-0 Prolene in the carotid for hemostasis.  We listen with Doppler and had good Doppler flow in the right carotid.  50 mg protamine was given.  The venous sheath was removed manual pressure was held for 5 minutes.  The neck incision was then irrigated out and closed in multiple  layers of 3-0 Vicryl 4-0 Monocryl and Dermabond.  He was awakened from anesthesia with no deficits  Complication: None  Condition: Stable  Cephus Shelling, MD [facility] [place] Office: [phone]   Cephus Shelling

## 2021-06-05 NOTE — Anesthesia Postprocedure Evaluation (Signed)
Anesthesia Post Note  Patient: Tyler Burns  Procedure(s) Performed: RIGHT TRANSCAROTID ARTERY REVASCULARIZATION (Right: Neck)     Patient location during evaluation: PACU Anesthesia Type: General Level of consciousness: awake and alert Pain management: pain level controlled Vital Signs Assessment: post-procedure vital signs reviewed and stable Respiratory status: spontaneous breathing, nonlabored ventilation, respiratory function stable and patient connected to nasal cannula oxygen Cardiovascular status: blood pressure returned to baseline and stable Postop Assessment: no apparent nausea or vomiting Anesthetic complications: no   No notable events documented.  Last Vitals:  Vitals:   06/05/21 1745 06/05/21 1811  BP: 112/85 113/79  Pulse: 68 70  Resp: 11 14  Temp:  36.6 C  SpO2: 99% 97%    Last Pain:  Vitals:   06/05/21 1811  TempSrc: Axillary  PainSc: 0-No pain                 Cecile Hearing

## 2021-06-05 NOTE — Anesthesia Procedure Notes (Signed)
Procedure Name: Intubation Date/Time: 06/05/2021 2:58 PM Performed by: Gwyndolyn Saxon, CRNA Pre-anesthesia Checklist: Patient identified, Emergency Drugs available, Suction available and Patient being monitored Patient Re-evaluated:Patient Re-evaluated prior to induction Oxygen Delivery Method: Circle System Utilized Preoxygenation: Pre-oxygenation with 100% oxygen Induction Type: IV induction Ventilation: Mask ventilation without difficulty Laryngoscope Size: Mac and 4 Grade View: Grade I Tube type: Oral Number of attempts: 1 Airway Equipment and Method: Stylet and Oral airway Placement Confirmation: ETT inserted through vocal cords under direct vision, positive ETCO2 and breath sounds checked- equal and bilateral Tube secured with: Tape Dental Injury: Teeth and Oropharynx as per pre-operative assessment  Comments: By Fernanda Drum

## 2021-06-05 NOTE — H&P (Signed)
History and Physical Interval Note:  06/05/2021 2:01 PM  Tyler Burns  has presented today for surgery, with the diagnosis of Right Carotid Artery Stenosis with Stroke.  The various methods of treatment have been discussed with the patient and family. After consideration of risks, benefits and other options for treatment, the patient has consented to  Procedure(s): RIGHT TRANSCAROTID ARTERY REVASCULARIZATION (Right) as a surgical intervention.  The patient's history has been reviewed, patient examined, no change in status, stable for surgery.  I have reviewed the patient's chart and labs.  Questions were answered to the patient's satisfaction.    Discussed plan for right carotid stent with neuro protection, right TCAR.  He has a symptomatic high-grade stenosis and was seen last week by Dr. Edilia Boickson.  I discussed risk and benefits inclusive including 1% risk of perioperative stroke.  He has taken his aspirin and Plavix today.  Still has a numbness in the left hand  Cephus Shellinghristopher J Kaleab Frasier  ASSESSMENT & PLAN    SYMPTOMATIC RIGHT CAROTID STENOSIS: This patient has a symptomatic right carotid stenosis.  Although the CT angiogram suggested an 80% stenosis, by velocity criteria based on his duplex its more likely a 60% stenosis.  He is now on maximal medical therapy as Plavix has been added.  I would agree that ideally the stenosis should be addressed given that he is symptomatic.  However, I do not think the stenosis is surgically accessible as the plaque extends quite high and I do not think that I could clamp above this.  He may be a candidate for transcarotid stenting.  He has adequate length in the common carotid artery and adequate diameters however he does have some disease in the common carotid artery which may potentially be a contraindication.  I have asked [facility] to the review the films and determine if he is a candidate for transcarotid stenting.  If he is then I will arrange for discussion  of transcribed stenting with one of my partners who is currently doing TCAR.  From my standpoint I think it would be reasonable to allow him to be discharged on aspirin Plavix and a statin.  I will be in touch with him once have heard back from [facility]     REASON FOR CONSULT:     Right carotid stenosis with right brain stroke.  The consult is requested by Dr. Oswaldo DoneVincent.   HPI:    Tyler EeDewey L Smitherman is a 65 y.o. male who yesterday morning developed the sudden onset of left arm weakness.  The arm was profoundly weak and he presented to the emergency department.  The weakness had improved significantly after about 2 hours and at this point has resolved completely.  He had no significant lower extremity weakness.  He denied expressive or receptive aphasia or amaurosis fugax.  He is had no previous history of stroke.  Prior to this event he was on aspirin and a statin.  Plavix has just been added.   He has previously undergone an abdominal aortic aneurysm repair by Dr. Karlene LinemanEarley.   His risk factors for peripheral vascular disease include hypertension.  He denies any history of diabetes, hypercholesterolemia, family history of premature cardiovascular disease or tobacco use.   According to the records he does have a history of atrial fibrillation.  He is not on anticoagulation.       Past Medical History:  Diagnosis Date   Asthma     Dysrhythmia      Afib  Glaucoma     High cholesterol     History of cardiovascular stress test      Myoview 12/19:  EF 50%, No ischemia, Low Risk   Hypertension             Family History  Problem Relation Age of Onset   Congestive Heart Failure Mother     Heart attack Mother 37   Heart attack Brother 24   Leukemia Brother     Heart failure Sister 58   Stroke Neg Hx        SOCIAL HISTORY: Social History         Tobacco Use   Smoking status: Former      Types: Cigarettes      Quit date: 1970      Years since quitting: 52.6   Smokeless tobacco: Never   Substance Use Topics   Alcohol use: No           Allergies  Allergen Reactions   Brinzolamide-Brimonidine Shortness Of Breath      Feels he has difficulty breathing when on medication Eye drop medication               Current Facility-Administered Medications  Medication Dose Route Frequency Provider Last Rate Last Admin   acetaminophen (TYLENOL) tablet 650 mg  650 mg Oral Q6H PRN Verdene Lennert, MD        Or   acetaminophen (TYLENOL) suppository 650 mg  650 mg Rectal Q6H PRN Verdene Lennert, MD       aspirin EC tablet 81 mg  81 mg Oral Daily Verdene Lennert, MD   81 mg at 05/28/21 0958   atorvastatin (LIPITOR) tablet 80 mg  80 mg Oral Q supper Verdene Lennert, MD   80 mg at 05/27/21 1712   brinzolamide (AZOPT) 1 % ophthalmic suspension 1 drop  1 drop Both Eyes BID Verdene Lennert, MD   1 drop at 05/28/21 1158   clopidogrel (PLAVIX) tablet 75 mg  75 mg Oral Daily Carmel Sacramento, MD   75 mg at 05/28/21 1457   cycloSPORINE (RESTASIS) 0.05 % ophthalmic emulsion 1 drop  1 drop Both Eyes Q12H Verdene Lennert, MD       enoxaparin (LOVENOX) injection 40 mg  40 mg Subcutaneous Q24H Verdene Lennert, MD   40 mg at 05/27/21 1712   latanoprost (XALATAN) 0.005 % ophthalmic solution 1 drop  1 drop Left Eye QHS Verdene Lennert, MD   1 drop at 05/27/21 2223   ondansetron (ZOFRAN) tablet 4 mg  4 mg Oral Q6H PRN Verdene Lennert, MD        Or   ondansetron (ZOFRAN) injection 4 mg  4 mg Intravenous Q6H PRN Verdene Lennert, MD       polyethylene glycol (MIRALAX / GLYCOLAX) packet 17 g  17 g Oral Daily PRN Verdene Lennert, MD       sodium chloride flush (NS) 0.9 % injection 3 mL  3 mL Intravenous Once Arby Barrette, MD       sodium chloride flush (NS) 0.9 % injection 3 mL  3 mL Intravenous Q12H Verdene Lennert, MD   3 mL at 05/28/21 1001      REVIEW OF SYSTEMS:  [X]  denotes positive finding, [ ]  denotes negative finding Cardiac   Comments:  Chest pain or chest pressure:      Shortness of breath  upon exertion: x    Short of breath when lying flat:      Irregular heart rhythm:  Vascular      Pain in calf, thigh, or hip brought on by ambulation:      Pain in feet at night that wakes you up from your sleep:      Blood clot in your veins:      Leg swelling:             Pulmonary      Oxygen at home:      Productive cough:      Wheezing:             Neurologic      Sudden weakness in arms or legs:  x Left arm  Sudden numbness in arms or legs:  X Left arm  Sudden onset of difficulty speaking or slurred speech:      Temporary loss of vision in one eye:      Problems with dizziness:             Gastrointestinal      Blood in stool:      Vomited blood:             Genitourinary      Burning when urinating:      Blood in urine:             Psychiatric      Major depression:             Hematologic      Bleeding problems:      Problems with blood clotting too easily:             Skin      Rashes or ulcers:             Constitutional      Fever or chills:      -   PHYSICAL EXAM:          Vitals:    05/28/21 0600 05/28/21 0834 05/28/21 0859 05/28/21 1210  BP: (!) 138/95   (!) 160/98 (!) 156/109  Pulse: 85 84 81 82  Resp: 17 20 18 18   Temp:   97.6 F (36.4 C)      TempSrc:   Oral      SpO2: 94% 97% 99% 99%  Weight:          Height:            Body mass index is 32.01 kg/m. GENERAL: The patient is a well-nourished male, in no acute distress. The vital signs are documented above. CARDIAC: There is a regular rate and rhythm. VASCULAR: I do not detect carotid bruits. He has palpable femoral pulses and palpable dorsalis pedis pulses bilaterally. PULMONARY: There is good air exchange bilaterally without wheezing or rales. ABDOMEN: Soft and non-tender with normal pitched bowel sounds.  He has a healed midline incision. MUSCULOSKELETAL: There are no major deformities. NEUROLOGIC: Currently I did not detect any focal weakness or paresthesias. SKIN:  There are no ulcers or rashes noted. PSYCHIATRIC: The patient has a normal affect.   DATA:     MRI BRAIN: He is on MRI of the brain on 05/27/2021 shows small acute right frontal and parietal infarcts.  He has mild chronic small vessel ischemic disease and cerebral atrophy.   CT ANGIO HEAD AND NECK:  There is no intracranial large vessel occlusion.  He has atherosclerotic disease of both carotid bifurcations.  There is extensive soft plaque at the right ICA bulb with a greater than 80% stenosis.  There are mild bilateral vertebral artery  origin stenoses.   CAROTID DUPLEX: I have independently interpreted his carotid duplex scan today.   On the right side there is a 60 to 79% stenosis in the proximal ICA.  This is at the lower end of that range. (202/57).  Based on these velocity criteria this would suggest a stenosis of about 60%.  The right vertebral artery is patent with antegrade flow.   On the left side there is a less than 39% stenosis.  The left vertebral artery is patent with antegrade flow.     Waverly Ferrari [facility]

## 2021-06-05 NOTE — Progress Notes (Signed)
Pt arrived to room 4e24 from PACU. Pt oriented to room and staff. Vitals obtained. CHG bath done. NIH completed.

## 2021-06-06 ENCOUNTER — Encounter (HOSPITAL_COMMUNITY): Payer: Self-pay | Admitting: Vascular Surgery

## 2021-06-06 LAB — BASIC METABOLIC PANEL
Anion gap: 10 (ref 5–15)
BUN: 15 mg/dL (ref 8–23)
CO2: 20 mmol/L — ABNORMAL LOW (ref 22–32)
Calcium: 8.9 mg/dL (ref 8.9–10.3)
Chloride: 105 mmol/L (ref 98–111)
Creatinine, Ser: 1.05 mg/dL (ref 0.61–1.24)
GFR, Estimated: >60 mL/min (ref >60)
Glucose, Bld: 132 mg/dL — ABNORMAL HIGH (ref 70–99)
Potassium: 4.5 mmol/L (ref 3.5–5.1)
Sodium: 135 mmol/L (ref 135–145)

## 2021-06-06 LAB — CBC
HCT: 46 % (ref 39.0–52.0)
Hemoglobin: 15.8 g/dL (ref 13.0–17.0)
MCH: 31.3 pg (ref 26.0–34.0)
MCHC: 34.3 g/dL (ref 30.0–36.0)
MCV: 91.1 fL (ref 80.0–100.0)
Platelets: 279 10*3/uL (ref 150–400)
RBC: 5.05 MIL/uL (ref 4.22–5.81)
RDW: 13.6 % (ref 11.5–15.5)
WBC: 15.3 10*3/uL — ABNORMAL HIGH (ref 4.0–10.5)
nRBC: 0 % (ref 0.0–0.2)

## 2021-06-06 LAB — LIPID PANEL
Cholesterol: 108 mg/dL (ref 0–200)
HDL: 23 mg/dL — ABNORMAL LOW (ref >40)
LDL Cholesterol: 69 mg/dL (ref 0–99)
Total CHOL/HDL Ratio: 4.7 RATIO
Triglycerides: 80 mg/dL (ref <150)
VLDL: 16 mg/dL (ref 0–40)

## 2021-06-06 LAB — POCT ACTIVATED CLOTTING TIME: Activated Clotting Time: 312 seconds

## 2021-06-06 MED ORDER — CLOPIDOGREL BISULFATE 75 MG PO TABS: 75.0000 mg | ORAL_TABLET | Freq: Every day | ORAL | 1 refills | Status: DC

## 2021-06-06 NOTE — Progress Notes (Signed)
Patient left discharge paperwork and clothing in room.  Family called and will return to pick up. Thomas Hoff, RN

## 2021-06-06 NOTE — Progress Notes (Signed)
PHARMACIST LIPID MONITORING   Tyler Burns is a 65 y.o. male admitted on 06/05/2021 with R carotid artery stenosis and stroke s/p revascularization.  Pharmacy has been consulted to optimize lipid-lowering therapy with the indication of secondary prevention for clinical ASCVD.  Recent Labs:  Lipid Panel (last 6 months):   Lab Results  Component Value Date   CHOL 108 06/06/2021   TRIG 80 06/06/2021   HDL 23 (L) 06/06/2021   CHOLHDL 4.7 06/06/2021   VLDL 16 06/06/2021   LDLCALC 69 06/06/2021    Hepatic function panel (last 6 months):   Lab Results  Component Value Date   AST 18 05/27/2021   ALT 15 05/27/2021   ALKPHOS 57 05/27/2021   BILITOT 0.5 05/27/2021    SCr (since admission):   Serum creatinine: 1.05 mg/dL 64/15/83 0940 Estimated creatinine clearance: 86.1 mL/min  Current therapy and lipid therapy tolerance Current lipid-lowering therapy: atorvastatin 80mg  Previous lipid-lowering therapies (if applicable): n/a Documented or reported allergies or intolerances to lipid-lowering therapies (if applicable): none  Assessment:   Patient prefers no changes in lipid-lowering therapy at this time due to just increased from lovastatin to atorvastatin on 8/1 and LDL at goal.   Plan:    1.Statin intensity (high intensity recommended for all patients regardless of the LDL):  No statin changes. The patient is already on a high intensity statin.  2.Add ezetimibe (if any one of the following):   Not indicated at this time.  3.Refer to lipid clinic:   No  4.Follow-up with:  Cardiology provider - , MD  5.Follow-up labs after discharge:  No changes in lipid therapy, repeat a lipid panel in one year.     Lance Muss, PharmD, BCPS, BCCP Clinical Pharmacist  Please check AMION for all [facility] phone numbers After 10:00 PM, call Main Pharmacy [phone]

## 2021-06-06 NOTE — Progress Notes (Signed)
NURSING PROGRESS NOTE  Tyler Burns [id number] Discharge Data: 06/06/2021 12:30 PM Attending Provider: Cephus Shelling, MD TDD:[id number], [name], MD     [name] to be D/C'd Home per MD order.  Discussed with the patient the After Visit Summary and all questions fully answered. All IV's discontinued with no bleeding noted. All belongings returned to patient for patient to take home.   Last Vital Signs:  Blood pressure 130/88, pulse 63, temperature 97.7 F (36.5 C), temperature source Oral, resp. rate 18, height 6' (1.829 m), weight 106.6 kg, SpO2 100 %.  Discharge Medication List Allergies as of 06/06/2021       Reactions   Brinzolamide-brimonidine Shortness Of Breath   Feels he has difficulty breathing when on medication Eye drop medication        Medication List     TAKE these medications    aspirin EC 81 MG tablet Take 1 tablet (81 mg total) by mouth daily.   atorvastatin 80 MG tablet Commonly known as: LIPITOR Take 1 tablet (80 mg total) by mouth daily with supper.   bimatoprost 0.01 % Soln Commonly known as: LUMIGAN Place 1 drop into the left eye at bedtime.   brinzolamide 1 % ophthalmic suspension Commonly known as: AZOPT Place 1 drop into both eyes 2 (two) times a day.   clopidogrel 75 MG tablet Commonly known as: PLAVIX Take 1 tablet (75 mg total) by mouth daily.   Fish Oil 600 MG Caps Take 600 mg by mouth daily.   metoprolol succinate 50 MG 24 hr tablet Commonly known as: TOPROL-XL Take 50 mg by mouth daily. Take with or immediately following a meal.   Restasis 0.05 % ophthalmic emulsion Generic drug: cycloSPORINE Place 1 drop into both eyes every 12 (twelve) hours.   vitamin E 180 MG (400 UNITS) capsule Take 400 Units by mouth daily.       ASK your doctor about these medications    cetirizine 10 MG tablet Commonly known as: ZYRTEC Take 1 tablet (10 mg total) by mouth daily. One tab daily for allergies

## 2021-06-06 NOTE — Progress Notes (Addendum)
  Progress Note    06/06/2021 7:27 AM 1 Day Post-Op  Subjective:  No neuro events overnight.  Ready for discharge home   Vitals:   06/06/21 0200 06/06/21 0400  BP: 113/75 132/89  Pulse: 75 65  Resp: 16 15  Temp:  97.9 F (36.6 C)  SpO2: 97% 98%   Physical Exam: Lungs:  non labored Incisions:  R neck incision c/d/I; L CFV puncture site without hematoma  Extremities:  moving all extremities well Neurologic: A&O; CN grossly intact  CBC    Component Value Date/Time   WBC 15.3 (H) 06/06/2021 0404   RBC 5.05 06/06/2021 0404   HGB 15.8 06/06/2021 0404   HCT 46.0 06/06/2021 0404   PLT 279 06/06/2021 0404   MCV 91.1 06/06/2021 0404   MCH 31.3 06/06/2021 0404   MCHC 34.3 06/06/2021 0404   RDW 13.6 06/06/2021 0404   LYMPHSABS 2.1 05/27/2021 0739   MONOABS 0.7 05/27/2021 0739   EOSABS 0.3 05/27/2021 0739   BASOSABS 0.1 05/27/2021 0739    BMET    Component Value Date/Time   NA 135 06/06/2021 0404   NA 140 07/30/2017 1212   K 4.5 06/06/2021 0404   CL 105 06/06/2021 0404   CO2 20 (L) 06/06/2021 0404   GLUCOSE 132 (H) 06/06/2021 0404   BUN 15 06/06/2021 0404   BUN 18 07/30/2017 1212   CREATININE 1.05 06/06/2021 0404   CALCIUM 8.9 06/06/2021 0404   GFRNONAA >60 06/06/2021 0404   GFRAA >60 05/26/2019 0447    INR    Component Value Date/Time   INR 1.2 06/04/2021 1036     Intake/Output Summary (Last 24 hours) at 06/06/2021 0727 Last data filed at 06/06/2021 0200 Gross per 24 hour  Intake 2524 ml  Output 275 ml  Net 2249 ml     Assessment/Plan:  65 y.o. male is s/p R TCAR 1 Day Post-Op   Neuro exam remains at baseline R neck incision without hematoma; L CFV puncture site without hematoma Ok for discharge home; follow up in 1 month with carotid duplex Continue aspirin, plavix, statin   Emilie Rutter, PA-C [facility] [phone] 06/06/2021 7:27 AM  I have seen and evaluated the patient. I agree with the PA note as documented above.   Postop day 1 status post right carotid stenting with flow reversal for neuro protection (right TCAR).  Neurologically intact this morning.  No events overnight.  Hemodynamic stable.  Right neck incision looks clean and dry.  Groin access for venous sheath looks good.  Plan discharge today on dual antiplatelet therapy with aspirin Plavix as well as statin.  We will arrange follow-up in 1 month with carotid duplex.  Discussed patient call with questions or concerns.  Cephus Shelling, MD [facility] [place] Office: [phone]

## 2021-06-10 ENCOUNTER — Other Ambulatory Visit: Payer: Self-pay | Admitting: Internal Medicine

## 2021-06-10 NOTE — Discharge Summary (Signed)
Discharge Summary     Tyler Burns December 23, 1955 65 y.o. male  [id number]  Admission Date: 06/05/2021  Discharge Date: 06/06/21  Physician: Dr. Chestine Spore  Admission Diagnosis: Carotid artery stenosis [I65.29] Carotid artery stenosis, symptomatic, right [I65.21]  Discharge Day services:   See progress note 06/06/21  Hospital Course:  The patient was admitted to the hospital and taken to the operating room on 06/05/2021 and underwent right sided TCAR due to high-grade symptomatic stenosis.  The patient tolerated the procedure well and was admitted to the hospital postoperatively.  POD #1 the patient's neuro status remained at baseline.  He was discharged home in stable condition.  He will continue aspirin Plavix statin daily.  He will follow-up in 4 weeks for a carotid duplex per protocol.   No results for input(s): NA, K, CL, CO2, GLUCOSE, BUN, CALCIUM in the last 72 hours.  Invalid input(s): CRATININE No results for input(s): WBC, HGB, HCT, PLT in the last 72 hours. No results for input(s): INR in the last 72 hours.     Discharge Diagnosis:  Carotid artery stenosis [I65.29] Carotid artery stenosis, symptomatic, right [I65.21]  Secondary Diagnosis: Patient Active Problem List   Diagnosis Date Noted   Carotid artery stenosis 06/05/2021   Carotid artery stenosis, symptomatic, right 06/05/2021   Essential hypertension 05/28/2021   Hyperlipidemia 05/28/2021   Cerebral infarction (HCC) 05/27/2021   AAA (abdominal aortic aneurysm) (HCC) 05/25/2019   Atrial fibrillation (HCC) 09/20/2013   Past Medical History:  Diagnosis Date   Asthma    Dysrhythmia    Afib   GERD (gastroesophageal reflux disease)    Glaucoma    High cholesterol    History of cardiovascular stress test    Myoview 12/19:  EF 50%, No ischemia, Low Risk   Hypertension    Stroke [facility])    August 2022    Allergies as of 06/06/2021       Reactions   Brinzolamide-brimonidine Shortness Of Breath   Feels  he has difficulty breathing when on medication Eye drop medication        Medication List     TAKE these medications    aspirin EC 81 MG tablet Take 1 tablet (81 mg total) by mouth daily.   atorvastatin 80 MG tablet Commonly known as: LIPITOR Take 1 tablet (80 mg total) by mouth daily with supper.   bimatoprost 0.01 % Soln Commonly known as: LUMIGAN Place 1 drop into the left eye at bedtime.   brinzolamide 1 % ophthalmic suspension Commonly known as: AZOPT Place 1 drop into both eyes 2 (two) times a day.   clopidogrel 75 MG tablet Commonly known as: PLAVIX Take 1 tablet (75 mg total) by mouth daily.   Fish Oil 600 MG Caps Take 600 mg by mouth daily.   metoprolol succinate 50 MG 24 hr tablet Commonly known as: TOPROL-XL Take 50 mg by mouth daily. Take with or immediately following a meal.   Restasis 0.05 % ophthalmic emulsion Generic drug: cycloSPORINE Place 1 drop into both eyes every 12 (twelve) hours.   vitamin E 180 MG (400 UNITS) capsule Take 400 Units by mouth daily.       ASK your doctor about these medications    cetirizine 10 MG tablet Commonly known as: ZYRTEC Take 1 tablet (10 mg total) by mouth daily. One tab daily for allergies         Discharge Instructions:   [facility] Discharge Instructions Carotid Endarterectomy (CEA)  Please  refer to the following instructions for your post-procedure care. Your surgeon or physician assistant will discuss any changes with you.  Activity  You are encouraged to walk as much as you can. You can slowly return to normal activities but must avoid strenuous activity and heavy lifting until your doctor tell you it's OK. Avoid activities such as vacuuming or swinging a golf club. You can drive after one week if you are comfortable and you are no longer taking prescription pain medications. It is normal to feel tired for serval weeks after your surgery. It is also normal to  have difficulty with sleep habits, eating, and bowel movements after surgery. These will go away with time.  Bathing/Showering  You may shower after you come home. Do not soak in a bathtub, hot tub, or swim until the incision heals completely.  Incision Care  Shower every day. Clean your incision with mild soap and water. Pat the area dry with a clean towel. You do not need a bandage unless otherwise instructed. Do not apply any ointments or creams to your incision. You may have skin glue on your incision. Do not peel it off. It will come off on its own in about one week. Your incision may feel thickened and raised for several weeks after your surgery. This is normal and the skin will soften over time. For Men Only: It's OK to shave around the incision but do not shave the incision itself for 2 weeks. It is common to have numbness under your chin that could last for several months.  Diet  Resume your normal diet. There are no special food restrictions following this procedure. A low fat/low cholesterol diet is recommended for all patients with vascular disease. In order to heal from your surgery, it is CRITICAL to get adequate nutrition. Your body requires vitamins, minerals, and protein. Vegetables are the best source of vitamins and minerals. Vegetables also provide the perfect balance of protein. Processed food has little nutritional value, so try to avoid this.  Medications  Resume taking all of your medications unless your doctor or physician assistant tells you not to.  If your incision is causing pain, you may take over-the- counter pain relievers such as acetaminophen (Tylenol). If you were prescribed a stronger pain medication, please be aware these medications can cause nausea and constipation.  Prevent nausea by taking the medication with a snack or meal. Avoid constipation by drinking plenty of fluids and eating foods with a high amount of fiber, such as fruits, vegetables, and grains. Do  not take Tylenol if you are taking prescription pain medications.  Follow Up  Our office will schedule a follow up appointment 2-3 weeks following discharge.  Please call us immediately for any of the following conditions  Increased pain, redness, drainage (pus) from your incision site. Fever of 101 degrees or higher. If you should develop stroke (slurred speech, difficulty swallowing, weakness on one side of your body, loss of vision) you should call 911 and go to the nearest emergency room.  Reduce your risk of vascular disease:  Stop smoking. If you would like help call [facility] at 1-800-QUIT-NOW ((838)286-2392) or [place] at [phone]. Manage your cholesterol Maintain a desired weight Control your diabetes Keep your blood pressure down  If you have any questions, please call the office at [phone].   Disposition: home  Patient's condition: is Good  Follow up: 1. Dr. Chestine Spore in 4 weeks.   Emilie Rutter, PA-C [facility] [phone]   ---  For [facility] use ---   Modified Rankin score at D/C (0-6): 0  IV medication needed for:  1. Hypertension: No 2. Hypotension: No  Post-op Complications: No  1. Post-op CVA or TIA: No  If yes: Event classification (right eye, left eye, right cortical, left cortical, verterobasilar, other):   If yes: Timing of event (intra-op, <6 hrs post-op, >=6 hrs post-op, unknown):   2. CN injury: No  If yes: CN  injuried   3. Myocardial infarction: No  If yes: Dx by (EKG or clinical, Troponin):   4.  CHF: No  5.  Dysrhythmia (new): No  6. Wound infection: No  7. Reperfusion symptoms: No  8. Return to OR: No  If yes: return to OR for (bleeding, neurologic, other CEA incision, other):   Discharge medications: Statin use:  Yes ASA use:  Yes   Beta blocker use:  Yes ACE-Inhibitor use:  No  ARB use:  No CCB use: No P2Y12 Antagonist use: Yes, [ x] Plavix, [ ]  Plasugrel, [ ]  Ticlopinine,  [ ]  Ticagrelor, [ ]  Other, [ ]  No for medical reason, [ ]  Non-compliant, [ ]  Not-indicated Anti-coagulant use:  No, [ ]  Warfarin, [ ]  Rivaroxaban, [ ]  Dabigatran,

## 2021-06-20 ENCOUNTER — Telehealth: Payer: Self-pay

## 2021-06-20 ENCOUNTER — Telehealth: Payer: Self-pay | Admitting: Interventional Cardiology

## 2021-06-20 NOTE — Telephone Encounter (Signed)
Patient calling complaining of SOB. He states that this happens with exertion and at rest. He states that he has felt this way since he was discharged on 8/11 s/p TCAR. Denies chest pain, palpitations, lightheadedness, dizziness, swelling, or any other Sx. Patient has a Hx of Afib but has declined anticoagulation. Patient is only taking ASA and plavix. BP 135/75 HR 71. Patient has not been seen since 10/2019 and is overdue for follow up. Offered appointment for today and patient declined. Patient has been scheduled on 9/1 (next available appointment). Instructed for the patient to call back if his Sx change or worsen. He verbalized understanding and thanked me for the call.

## 2021-06-20 NOTE — Telephone Encounter (Signed)
Pt c/o Shortness Of Breath: STAT if SOB developed within the last 24 hours or pt is noticeably SOB on the phone  1. Are you currently SOB (can you hear that pt is SOB on the phone)? no  2. How long have you been experiencing SOB? Since he was discharged from the hospital 06/06/21  3. Are you SOB when sitting or when up moving around? It can happen at any time. He can not predict when it can come and go   4. Are you currently experiencing any other symptoms? No  The patient states he called the Vascular doctor first to report his symptoms, and they advised him to call his Cardiologist

## 2021-06-20 NOTE — Telephone Encounter (Signed)
Patient is s/p TCAR on 8/10 and calls to report random bouts of SOB. No pain is involved, denies fever, chills, redness or drainage from incision. He just has about 10 seconds of SOB "like you've been doing something" and then it goes away. His girlfriend thinks it is anxiety - patient denies anxiety history. Advised patient it was unlikely related to the TCAR - with his history of atrial fibrillation I asked him to call cardiology and see what they said. Patient verbalizes understanding.

## 2021-06-24 ENCOUNTER — Observation Stay (HOSPITAL_COMMUNITY)
Admission: EM | Admit: 2021-06-24 | Discharge: 2021-06-26 | Disposition: A | Discharge status: Home / Self Care | Payer: Medicare Other | Attending: Internal Medicine | Admitting: Internal Medicine

## 2021-06-24 ENCOUNTER — Other Ambulatory Visit: Payer: Self-pay

## 2021-06-24 DIAGNOSIS — Z79899 Other long term (current) drug therapy: Secondary | ICD-10-CM | POA: Diagnosis not present

## 2021-06-24 DIAGNOSIS — Z7982 Long term (current) use of aspirin: Secondary | ICD-10-CM | POA: Insufficient documentation

## 2021-06-24 DIAGNOSIS — J45909 Unspecified asthma, uncomplicated: Secondary | ICD-10-CM | POA: Diagnosis not present

## 2021-06-24 DIAGNOSIS — Z87891 Personal history of nicotine dependence: Secondary | ICD-10-CM | POA: Diagnosis not present

## 2021-06-24 DIAGNOSIS — R200 Anesthesia of skin: Secondary | ICD-10-CM | POA: Diagnosis present

## 2021-06-24 DIAGNOSIS — I10 Essential (primary) hypertension: Secondary | ICD-10-CM | POA: Diagnosis not present

## 2021-06-24 DIAGNOSIS — I639 Cerebral infarction, unspecified: Principal | ICD-10-CM

## 2021-06-24 LAB — CBC WITH DIFFERENTIAL/PLATELET
Abs Immature Granulocytes: 0.03 10*3/uL (ref 0.00–0.07)
Basophils Absolute: 0.1 10*3/uL (ref 0.0–0.1)
Basophils Relative: 1 %
Eosinophils Absolute: 0.2 10*3/uL (ref 0.0–0.5)
Eosinophils Relative: 2 %
HCT: 45 % (ref 39.0–52.0)
Hemoglobin: 14.8 g/dL (ref 13.0–17.0)
Immature Granulocytes: 0 %
Lymphocytes Relative: 19 %
Lymphs Abs: 1.4 10*3/uL (ref 0.7–4.0)
MCH: 30.7 pg (ref 26.0–34.0)
MCHC: 32.9 g/dL (ref 30.0–36.0)
MCV: 93.4 fL (ref 80.0–100.0)
Monocytes Absolute: 0.6 10*3/uL (ref 0.1–1.0)
Monocytes Relative: 8 %
Neutro Abs: 5.3 10*3/uL (ref 1.7–7.7)
Neutrophils Relative %: 70 %
Platelets: 354 10*3/uL (ref 150–400)
RBC: 4.82 MIL/uL (ref 4.22–5.81)
RDW: 13.1 % (ref 11.5–15.5)
WBC: 7.5 10*3/uL (ref 4.0–10.5)
nRBC: 0 % (ref 0.0–0.2)

## 2021-06-24 LAB — URINALYSIS, ROUTINE W REFLEX MICROSCOPIC
Bacteria, UA: NONE SEEN
Bilirubin Urine: NEGATIVE
Glucose, UA: NEGATIVE mg/dL
Hgb urine dipstick: NEGATIVE
Ketones, ur: NEGATIVE mg/dL
Nitrite: NEGATIVE
Protein, ur: NEGATIVE mg/dL
Specific Gravity, Urine: 1.006 (ref 1.005–1.030)
pH: 6 (ref 5.0–8.0)

## 2021-06-24 LAB — BASIC METABOLIC PANEL
Anion gap: 7 (ref 5–15)
BUN: 16 mg/dL (ref 8–23)
CO2: 27 mmol/L (ref 22–32)
Calcium: 9.2 mg/dL (ref 8.9–10.3)
Chloride: 103 mmol/L (ref 98–111)
Creatinine, Ser: 1.03 mg/dL (ref 0.61–1.24)
GFR, Estimated: >60 mL/min (ref >60)
Glucose, Bld: 104 mg/dL — ABNORMAL HIGH (ref 70–99)
Potassium: 4.3 mmol/L (ref 3.5–5.1)
Sodium: 137 mmol/L (ref 135–145)

## 2021-06-24 NOTE — ED Provider Notes (Signed)
Emergency Medicine Provider Triage Evaluation Note  Tyler Burns , a 65 y.o. male  was evaluated in triage.  Pt thinks he is having a stroke. Previously had leg numbness and overall agitation. No difficulty with balance or speech. Recent stroke. Only current sx are agitation.  Review of Systems  Positive: Agitation, resolved RLE numbness Negative: Speech difficulty, trouble moving   Physical Exam  There were no vitals taken for this visit. Gen:   Awake, no distress   Resp:  Normal effort MSK:   Moves extremities without difficulty  Other:  Neuro exam normal-no signs of stroke.   Medical Decision Making  Medically screening exam initiated at 4:58 PM.  Appropriate orders placed.  [name] was informed that the remainder of the evaluation will be completed by another provider, this initial triage assessment does not replace that evaluation, and the importance of remaining in the ED until their evaluation is complete.     Woodroe Chen 06/24/21 1715    Glendora Score, MD 06/25/21 [phone]

## 2021-06-24 NOTE — ED Triage Notes (Signed)
Pt with recent hx of stroke here for eval of abnormal sensation to LLE onset 1 hour ago. Reports numbness and maintains ability to walk but says he has to "drag it behind him." Also states he "got real agitated" at that time. Endorses lingering agitation but states sensation in leg is back to normal.

## 2021-06-25 ENCOUNTER — Emergency Department (HOSPITAL_COMMUNITY): Payer: Medicare Other

## 2021-06-25 ENCOUNTER — Observation Stay (HOSPITAL_BASED_OUTPATIENT_CLINIC_OR_DEPARTMENT_OTHER): Payer: Medicare Other

## 2021-06-25 ENCOUNTER — Observation Stay (HOSPITAL_COMMUNITY): Payer: Medicare Other

## 2021-06-25 ENCOUNTER — Encounter (HOSPITAL_COMMUNITY): Payer: Self-pay | Admitting: Emergency Medicine

## 2021-06-25 DIAGNOSIS — I63131 Cerebral infarction due to embolism of right carotid artery: Secondary | ICD-10-CM

## 2021-06-25 DIAGNOSIS — I639 Cerebral infarction, unspecified: Secondary | ICD-10-CM | POA: Diagnosis not present

## 2021-06-25 IMAGING — MR MR LUMBAR SPINE W/O CM
4 of 5 series · 26 of 48 positions shown · non-contrast
Comparison: X-ray [DATE], CT [DATE]

CLINICAL DATA: Intervertebral disc degeneration evaluate
degenerative disc disease. History of abdominal aortic aneurysm and
bilateral common iliac artery aneurysms.

EXAM:
MRI LUMBAR SPINE WITHOUT CONTRAST
TECHNIQUE: Multiplanar, multisequence MR imaging of the lumbar spine was
performed. No intravenous contrast was administered.

[Series 5: T2 · sagittal · 4.0mm · 0.73mm/px · 6 of 16 slices shown (1 of 2)]
[im 1/16]
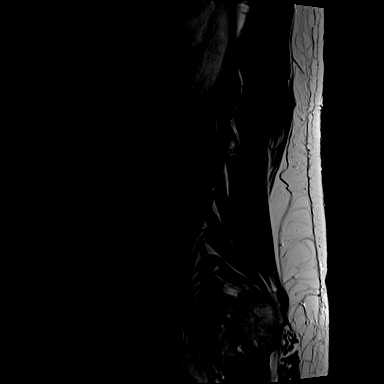
[im 4/16]
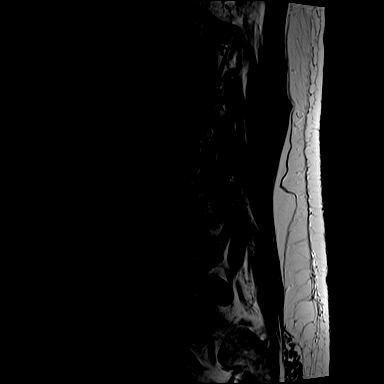
[im 7/16]
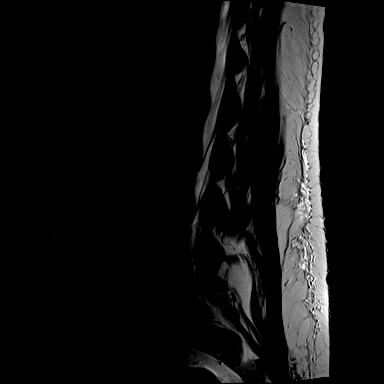
[im 10/16]
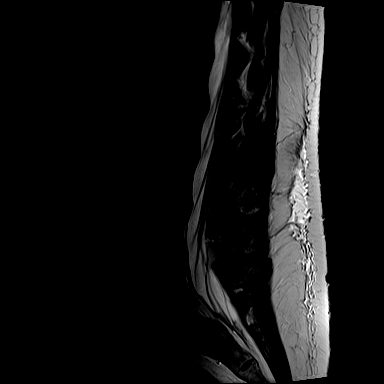
[im 13/16]
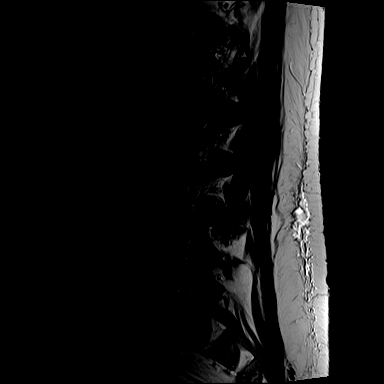
[im 16/16]
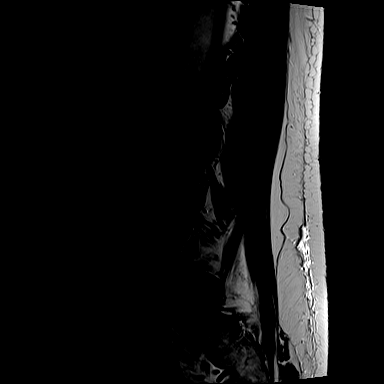

[Series 7: T1 · sagittal · 4.0mm · 0.88mm/px · 6 of 16 slices shown (1 of 2)]
[im 1/16]
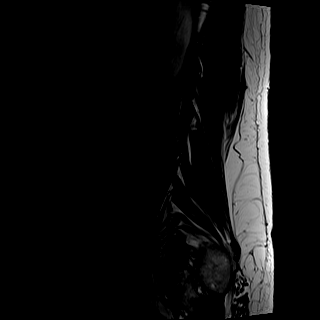
[im 4/16]
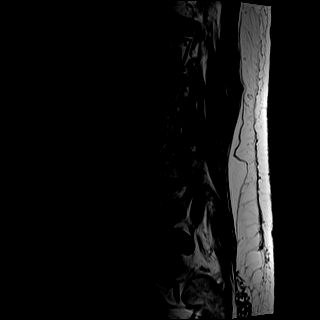
[im 7/16]
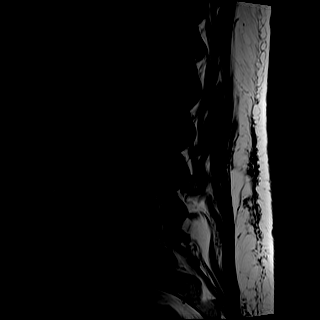
[im 10/16]
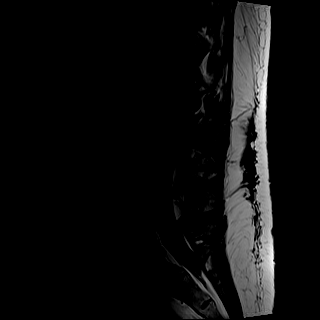
[im 13/16]
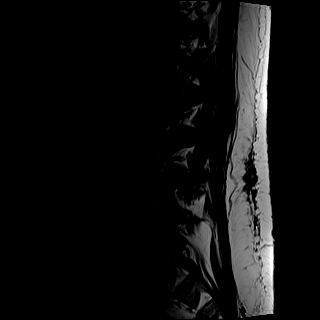
[im 16/16]
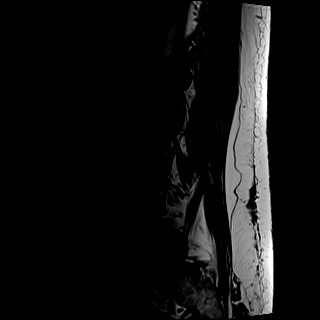

[Series 8: T1 · axial · 4.0mm · 0.34mm/px · z∈[-28,+161]mm · 5 of 37 slices shown (2 of 2)]
[im 1/37]
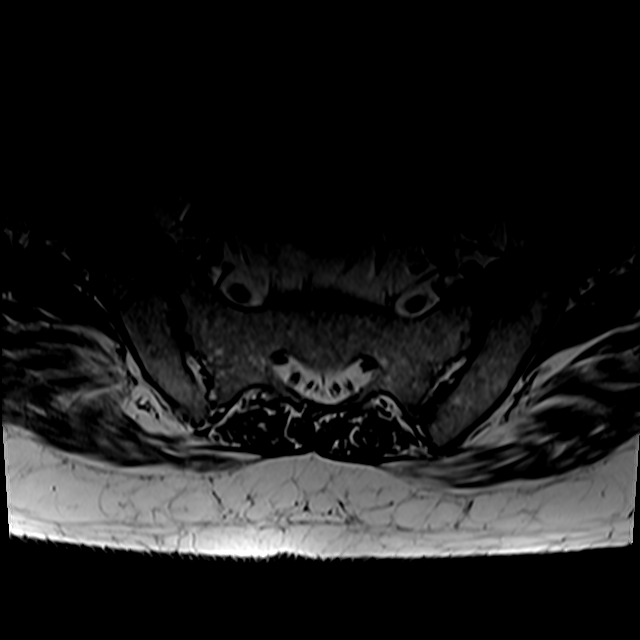
[im 6/37]
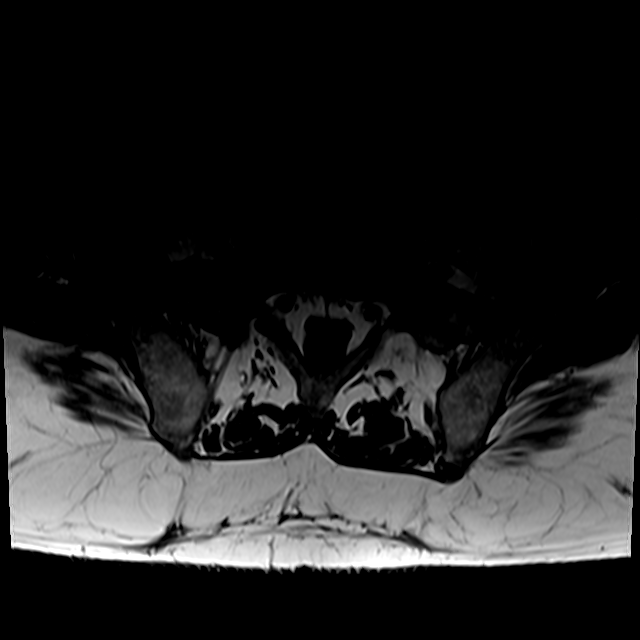
[im 11/37]
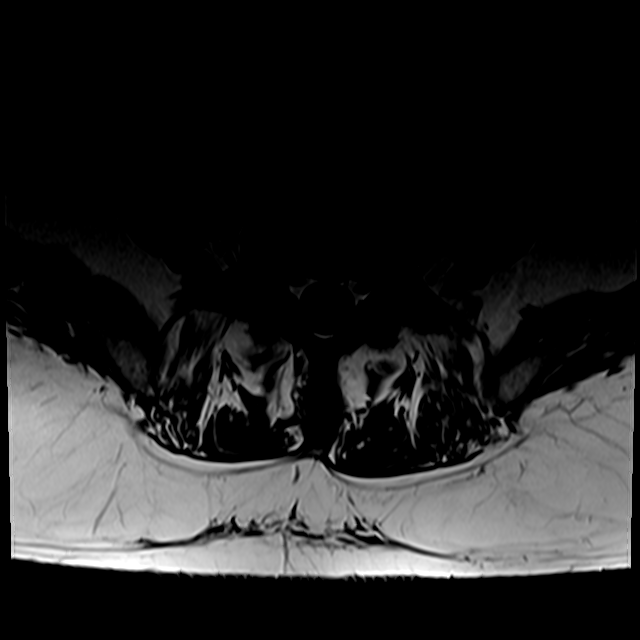
[im 19/37]
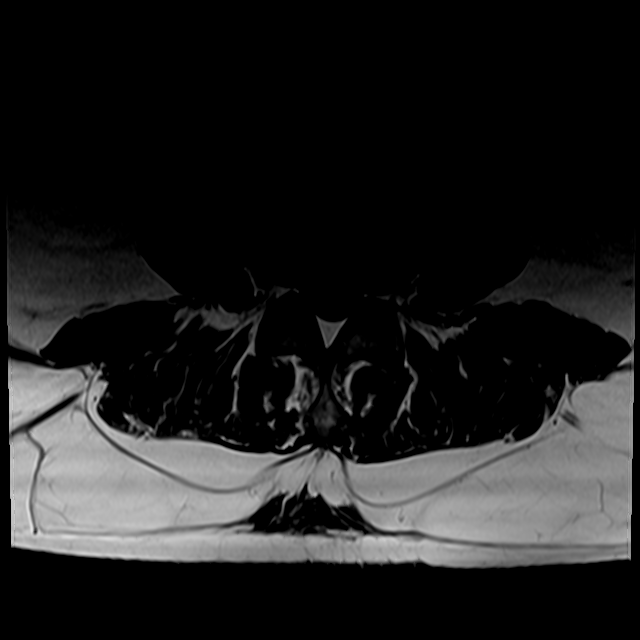
[im 31/37]
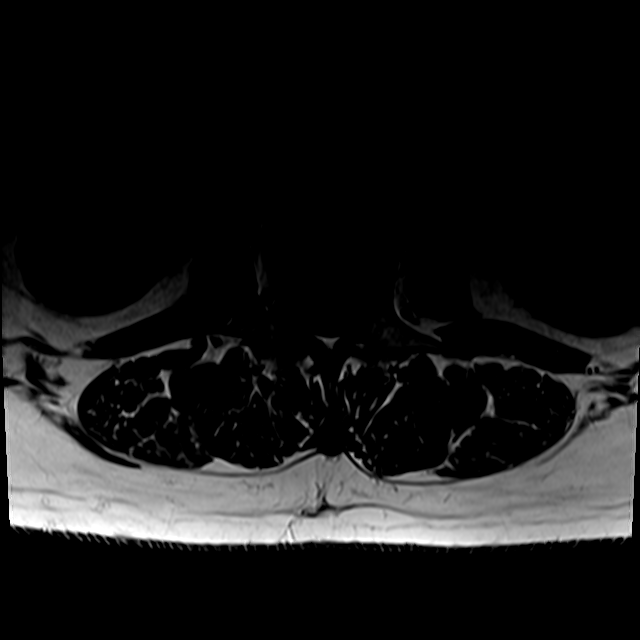

[Series 10: T2 · axial · 4.0mm · 0.57mm/px · z∈[-44,+177]mm · 9 of 37 slices shown (2 of 2)]
[im 1/37]
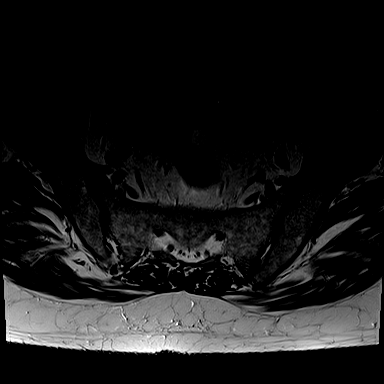
[im 6/37]
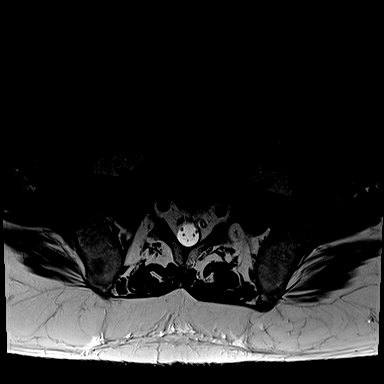
[im 11/37]
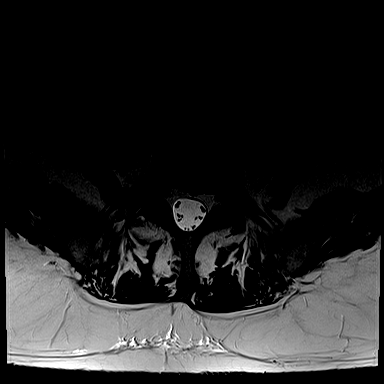
[im 16/37]
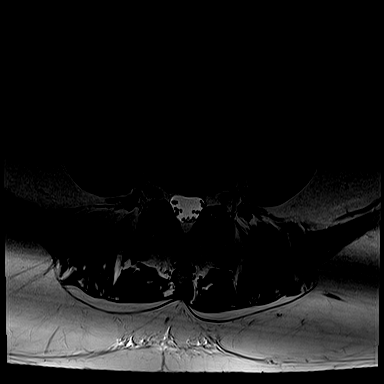
[im 19/37]
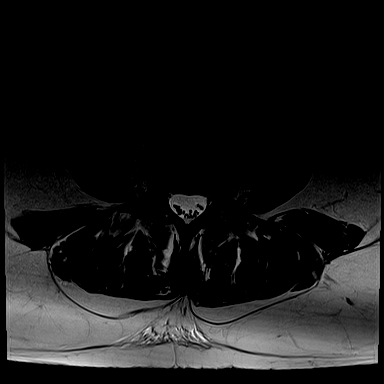
[im 21/37]
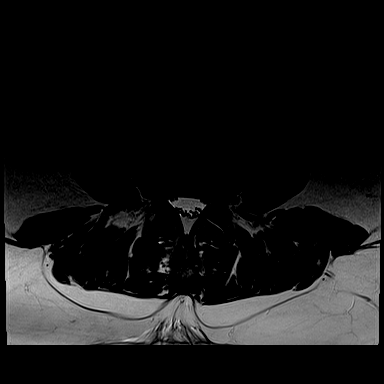
[im 26/37]
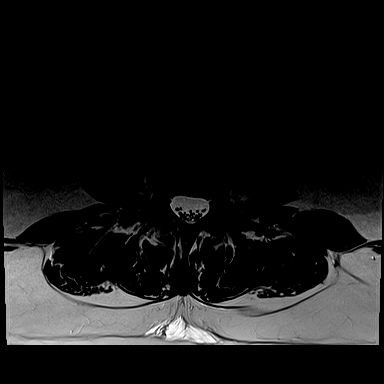
[im 31/37]
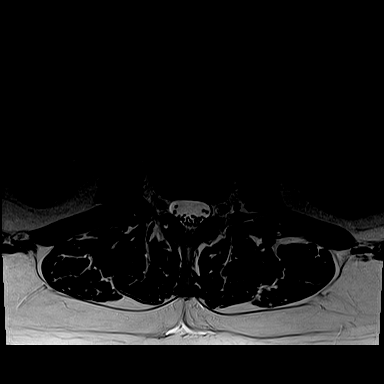
[im 37/37]
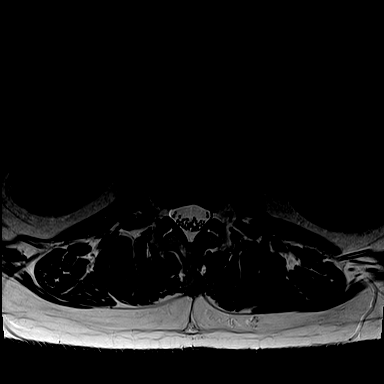

[26 of 48 positions shown; findings below may reference images not displayed]

FINDINGS: Segmentation:  Standard.

Alignment:  Physiologic.

Vertebrae:  No fracture, evidence of discitis, or bone lesion.

Conus medullaris and cauda equina: Conus extends to the L1 level.
Conus and cauda equina appear normal.

Paraspinal and other soft tissues: Bilateral common iliac artery
aneurysms measuring approximately 2.2 cm on the left and 2.1 cm on
the right, not appreciably changed from prior CT of [2M]. Known
abdominal aortic aneurysm is not well seen the current study.

Disc levels:

T12-L1: No significant disc protrusion, foraminal stenosis, or canal
stenosis.

L1-L2: Tiny posterior annular fissure. No significant disc
protrusion, foraminal stenosis, or canal stenosis.

L2-L3: Mild disc desiccation with minimal annular disc bulge. No
foraminal or canal stenosis.

L3-L4: Mild circumferential disc bulge. Mild bilateral facet
hypertrophy. No foraminal or canal stenosis.

L4-L5: Minimal annular disc bulge. Mild bilateral facet arthropathy.
No foraminal or canal stenosis.

L5-S1: Small central disc protrusion. Moderate bilateral facet
arthropathy. Mild right foraminal stenosis. No canal or left
foraminal stenosis.
IMPRESSION: 1. Mild multilevel degenerative changes of the lumbar spine
including mild right foraminal stenosis at L5-S1. No canal stenosis
at any level.
2. Bilateral common iliac artery aneurysms, not appreciably changed
from prior CT of [2M].
Known abdominal aortic aneurysm is not well seen the current study.

## 2021-06-25 IMAGING — MR MR HEAD W/O CM
12 of 13 series · 44 of 48 positions shown · non-contrast
Comparison: [DATE]

CLINICAL DATA: Leg numbness and agitation

EXAM:
MRI HEAD WITHOUT CONTRAST
TECHNIQUE: Multiplanar, multiecho pulse sequences of the brain and surrounding
structures were obtained without intravenous contrast.

[Series 5: DWI · axial · 3.0mm · 0.88mm/px · z∈[-97,+51]mm · 8 of 102 slices shown (1 of 4)]
[im 1/102]
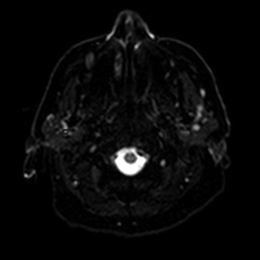
[im 15/102]
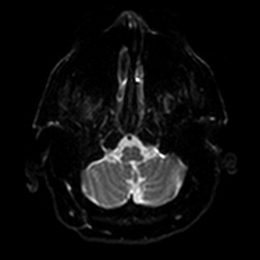
[im 29/102]
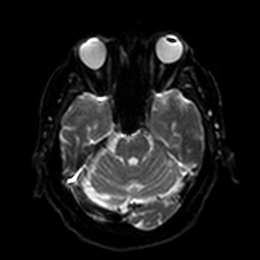
[im 44/102]
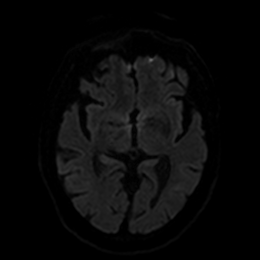
[im 58/102]
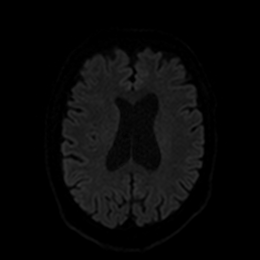
[im 73/102]
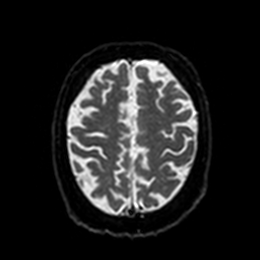
[im 87/102]
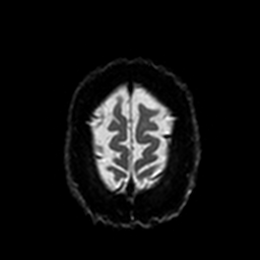
[im 102/102]
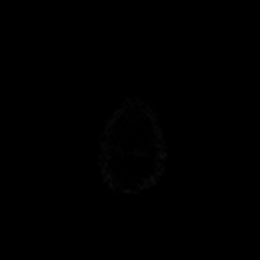

[Series 6: DWI · axial · 3.0mm · 0.88mm/px · z∈[-97,+51]mm · 4 of 51 slices shown (2 of 4)]
[im 1/51]
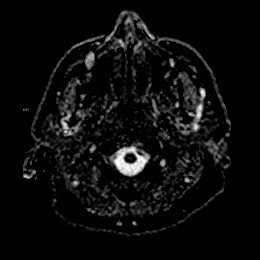
[im 17/51]
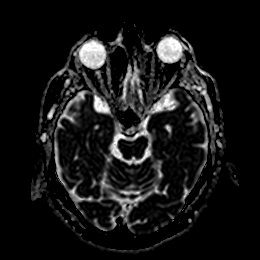
[im 34/51]
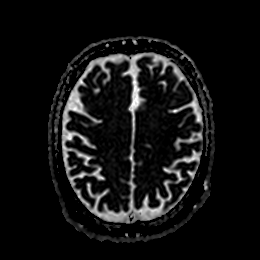
[im 51/51]
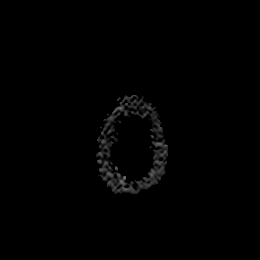

[Series 7: DWI · coronal · 4.0mm · 0.88mm/px · 5 of 72 slices shown (3 of 4)]
[im 1/72]
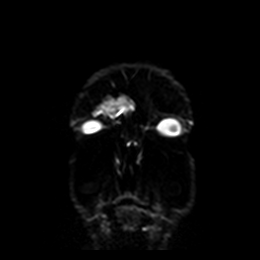
[im 18/72]
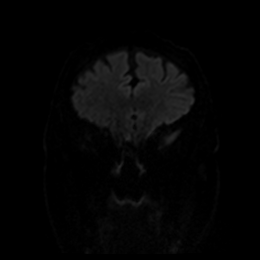
[im 36/72]
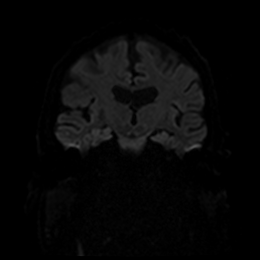
[im 54/72]
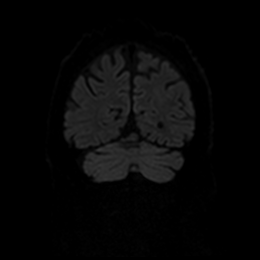
[im 72/72]
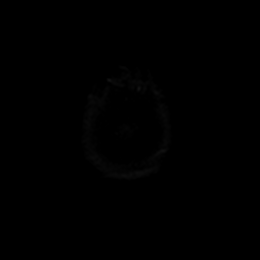

[Series 8: DWI · coronal · 4.0mm · 0.88mm/px · 3 of 36 slices shown (4 of 4)]
[im 1/36]
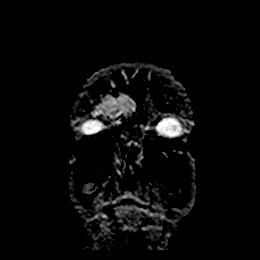
[im 18/36]
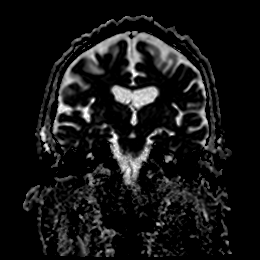
[im 36/36]
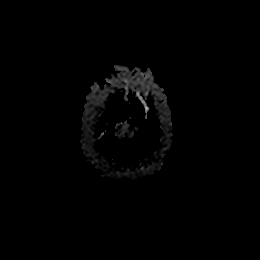

[Series 9: T1 · sagittal · 5.0mm · 0.75mm/px · 2 of 25 slices shown]
[im 1/25]
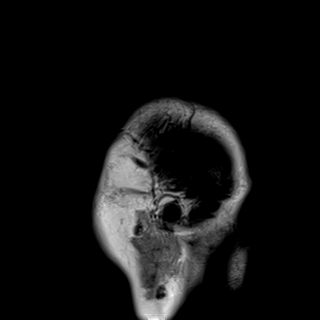
[im 25/25]
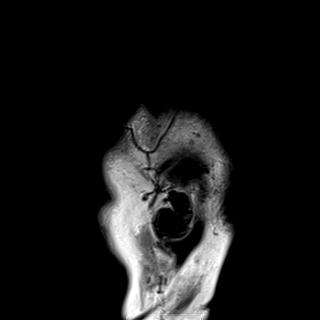

[Series 10: T2 · axial · 5.0mm · 0.72mm/px · z∈[-100,+54]mm · 2 of 27 slices shown (1 of 2)]
[im 1/27]
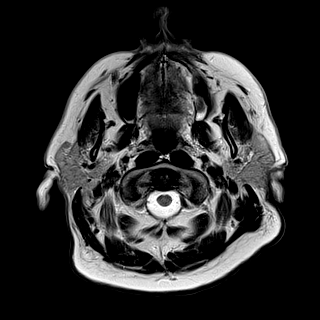
[im 27/27]
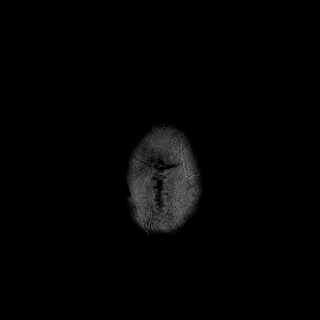

[Series 11: FLAIR · axial · 5.0mm · 0.45mm/px · z∈[-100,+55]mm · 2 of 27 slices shown]
[im 1/27]
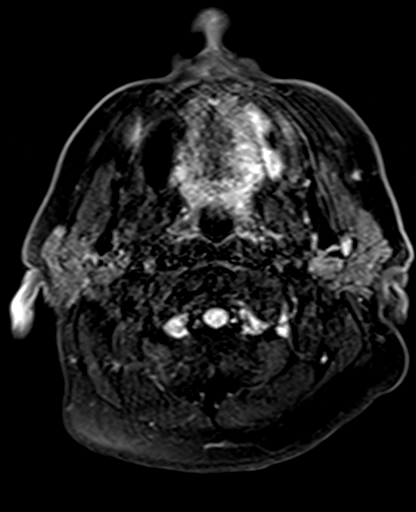
[im 27/27]
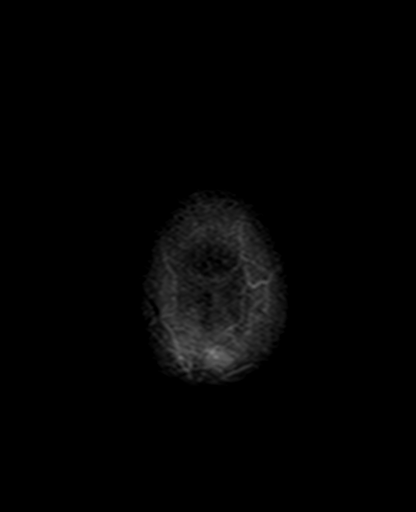

[Series 12: mag_images · axial · 3.0mm · 0.90mm/px · z∈[-104,+59]mm · 4 of 56 slices shown]
[im 1/56]
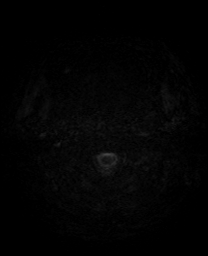
[im 19/56]
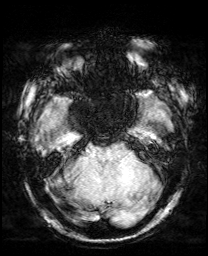
[im 37/56]
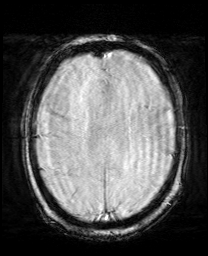
[im 56/56]
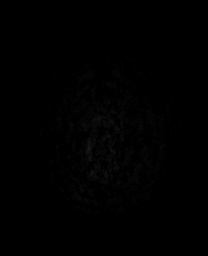

[Series 13: pha_images · axial · 3.0mm · 0.90mm/px · z∈[-104,+59]mm · 4 of 53 slices shown]
[im 1/53]
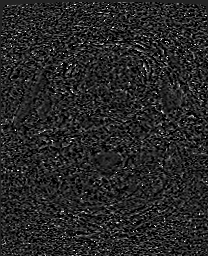
[im 18/53]
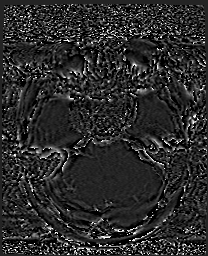
[im 35/53]
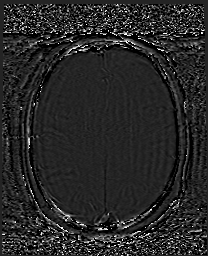
[im 53/53]
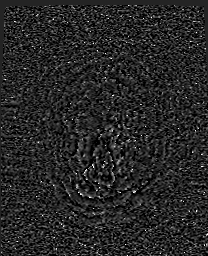

[Series 14: swi_images · axial · 3.0mm · 0.90mm/px · z∈[-104,+59]mm · 4 of 56 slices shown]
[im 1/56]
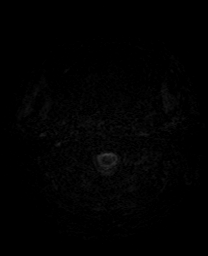
[im 19/56]
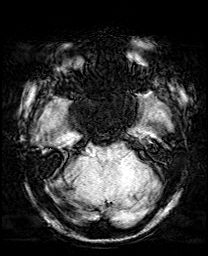
[im 37/56]
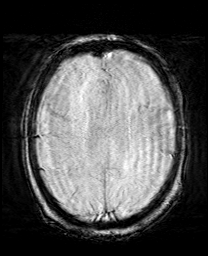
[im 56/56]
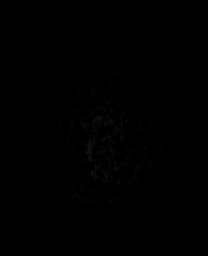

[Series 15: mip_images(sw) · axial · 24.0mm · 0.90mm/px · z∈[-94,+49]mm · 4 of 49 slices shown]
[im 1/49]
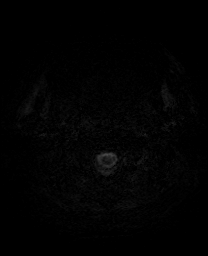
[im 17/49]
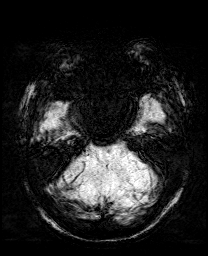
[im 33/49]
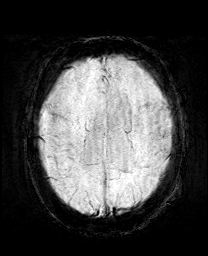
[im 49/49]
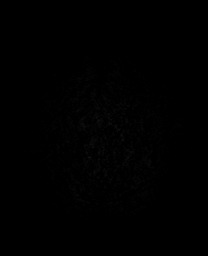

[Series 17: T2 · coronal · 5.0mm · 0.34mm/px · 2 of 29 slices shown (2 of 2)]
[im 1/29]
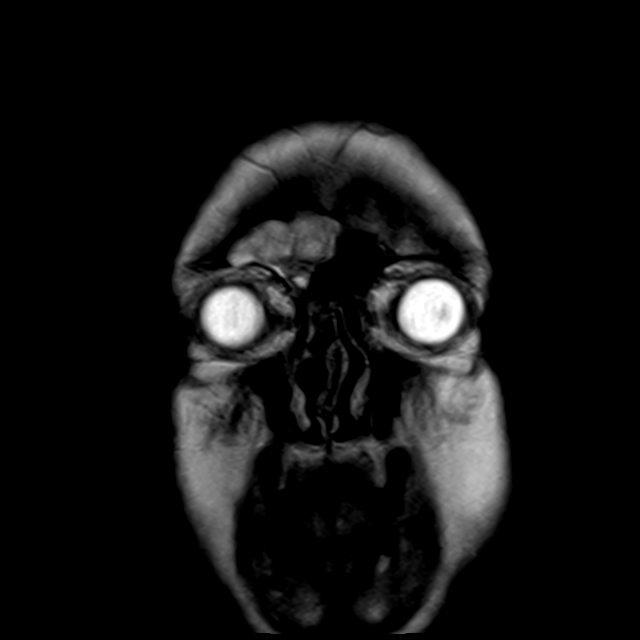
[im 29/29]
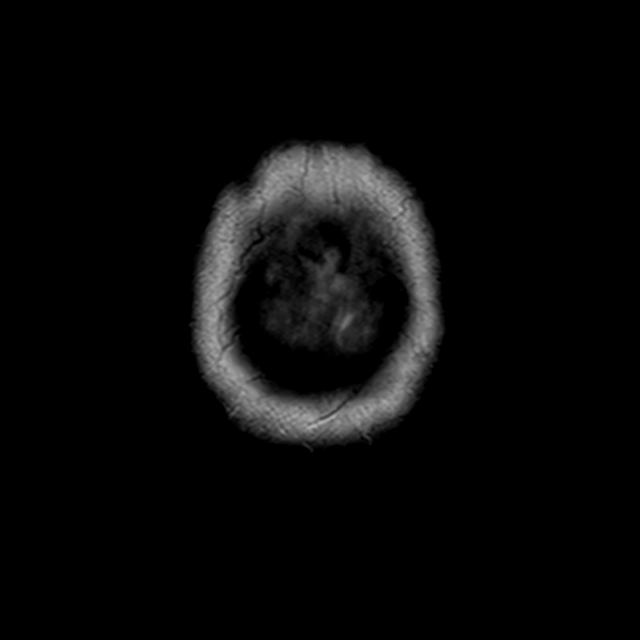

[44 of 48 positions shown; findings below may reference images not displayed]

FINDINGS: Brain: Recent right perirolandic infarcts as previously
demonstrated, with fading. Two new acute infarcts are seen in the
high posterior right frontal cortex. New subcortical parasagittal
right frontal tiny infarct which is in the ACA territory. No acute
hemorrhage, hydrocephalus, or masslike finding. Normal brain volume
and white matter appearance for age.

Vascular: Preserved flow voids

Skull and upper cervical spine: Normal marrow signal

Sinuses/Orbits: Right cataract resection. Chronic right frontal
sinusitis with opacification from peripheral mucosal thickening and
central inspissated material.

Other: Motion artifact
IMPRESSION: Small acute to subacute infarcts in the high right frontal lobe (ACA
and MCA distribution) since comparison [DATE]. There has been
expected fading of other recent infarcts in this territory on prior.
This is likely recurrent embolic disease from the patient's severe
right ICA stenosis in the neck.

## 2021-06-25 IMAGING — MR MR MRA HEAD W/O CM
2 series · 37 of 48 positions shown · non-contrast
Comparison: CTA of the head and neck from [DATE]

CLINICAL DATA: Neuro deficit acute stroke suspected

EXAM:
MRA HEAD WITHOUT CONTRAST
TECHNIQUE: Angiographic images of the Circle of Willis were acquired using MRA
technique without intravenous contrast.

[Series 1: aahead_scout · sagittal · 1.6mm · 1.62mm/px · 20 of 128 slices shown]
[im 1/128]
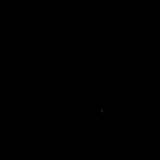
[im 7/128]
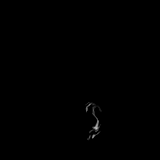
[im 14/128]
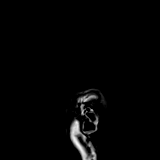
[im 21/128]
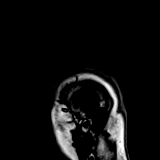
[im 27/128]
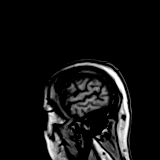
[im 34/128]
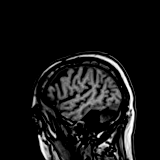
[im 41/128]
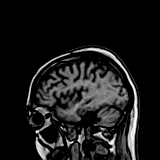
[im 47/128]
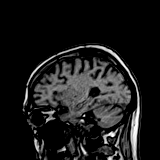
[im 54/128]
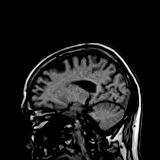
[im 61/128]
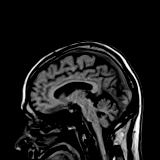
[im 67/128]
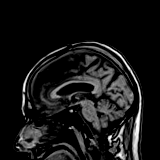
[im 74/128]
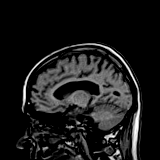
[im 81/128]
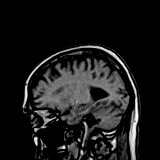
[im 87/128]
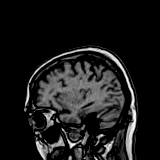
[im 94/128]
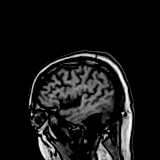
[im 101/128]
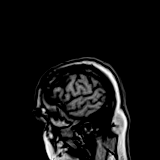
[im 107/128]
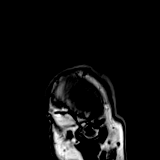
[im 114/128]
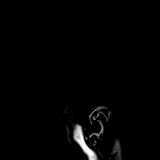
[im 121/128]
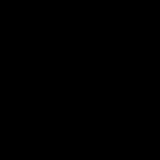
[im 128/128]
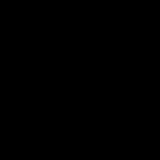

[Series 5: 3d cow · axial · 0.5mm · 0.41mm/px · z∈[-99,-18]mm · 17 of 172 slices shown]
[im 1/172]
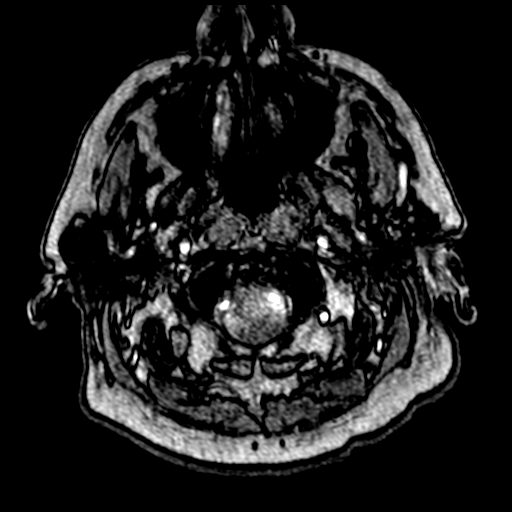
[im 7/172]
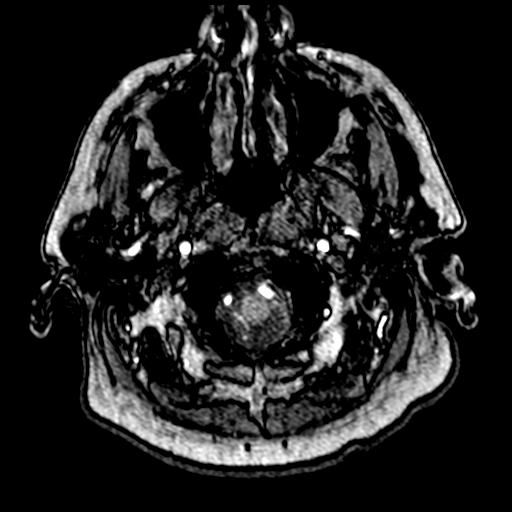
[im 13/172]
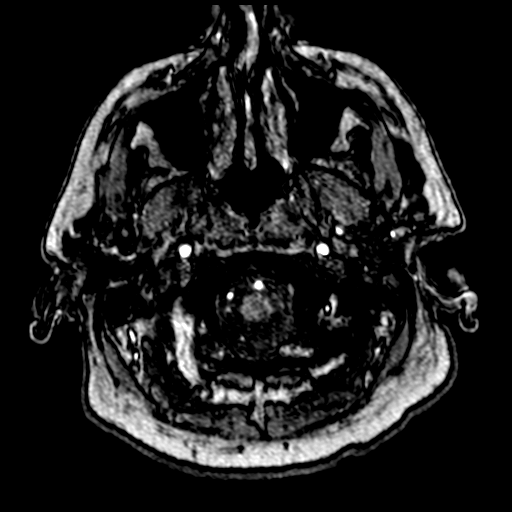
[im 20/172]
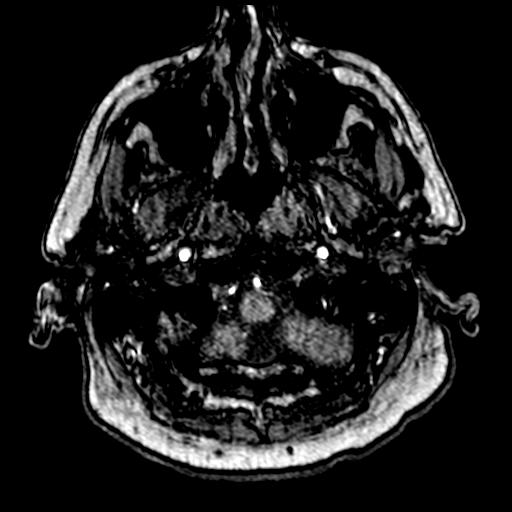
[im 26/172]
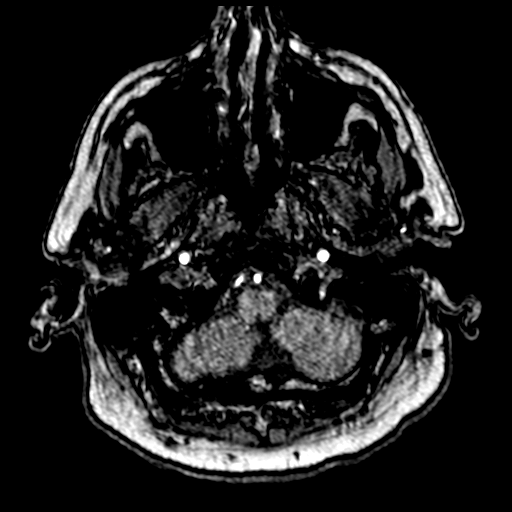
[im 32/172]
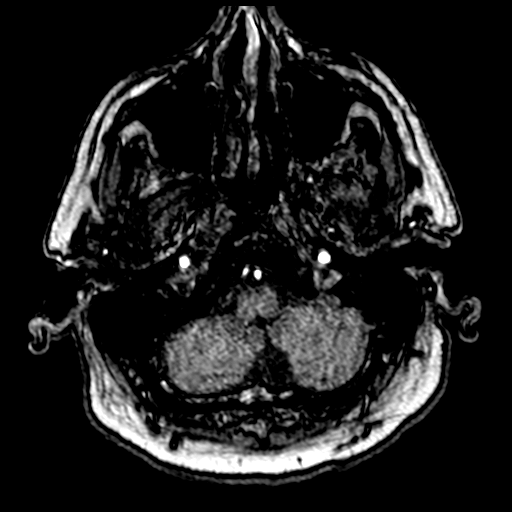
[im 39/172]
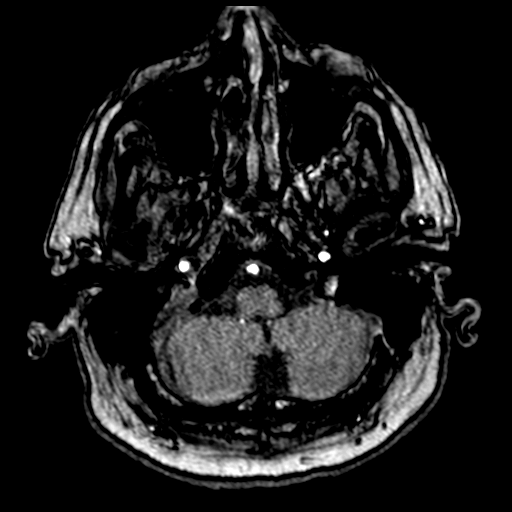
[im 45/172]
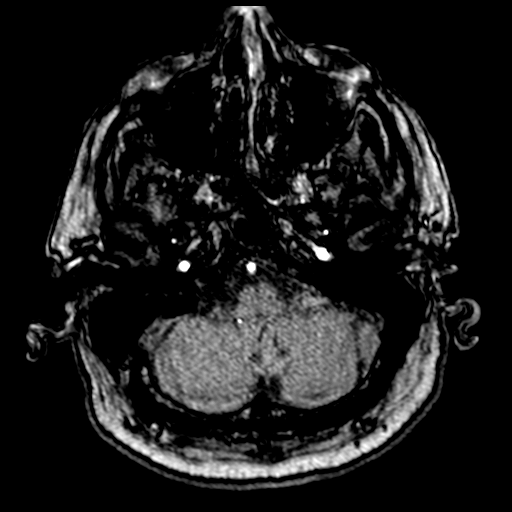
[im 51/172]
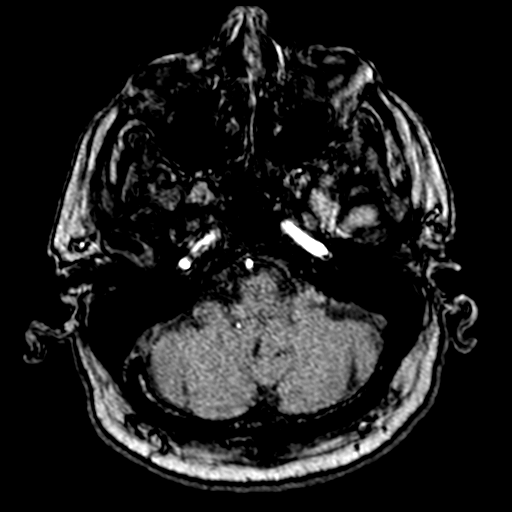
[im 58/172]
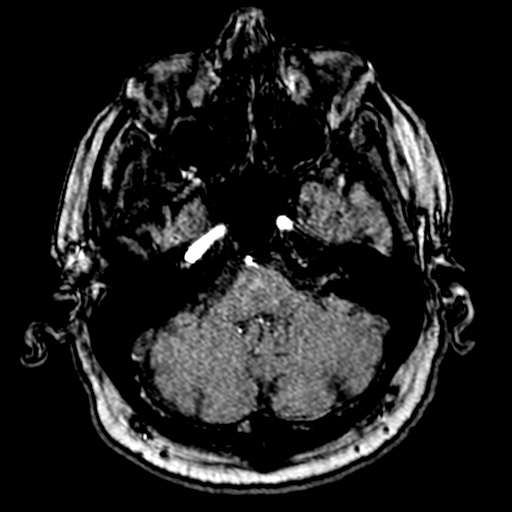
[im 77/172]
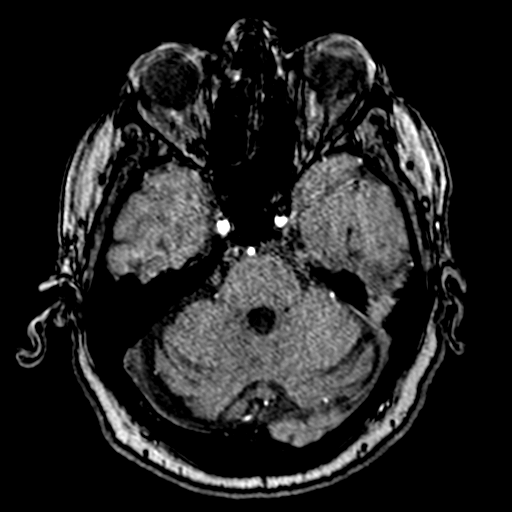
[im 89/172]
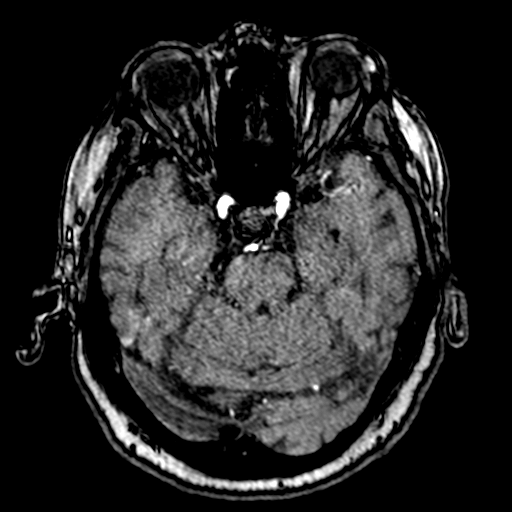
[im 96/172]
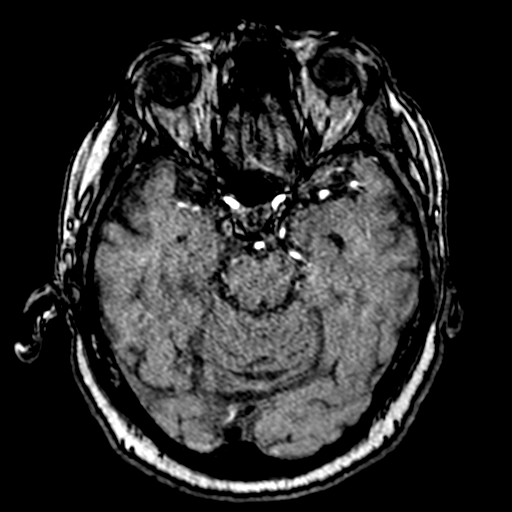
[im 121/172]
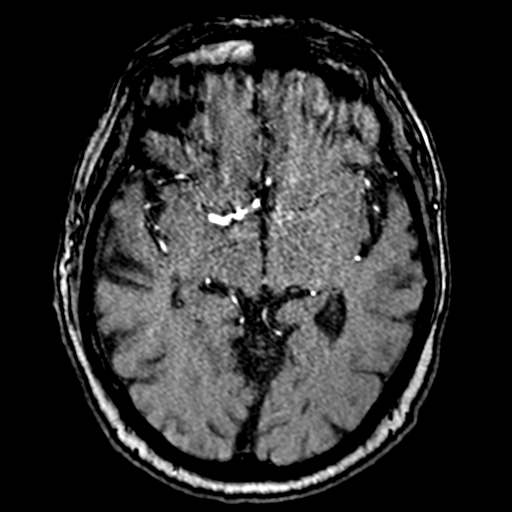
[im 140/172]
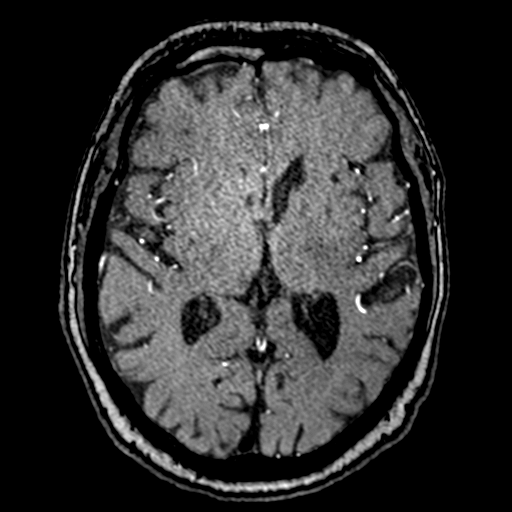
[im 146/172]
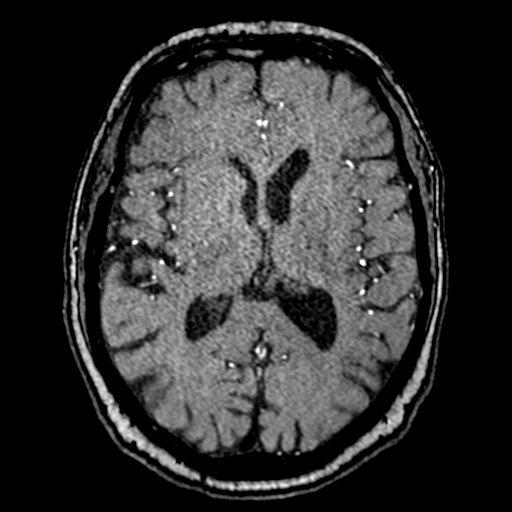
[im 165/172]
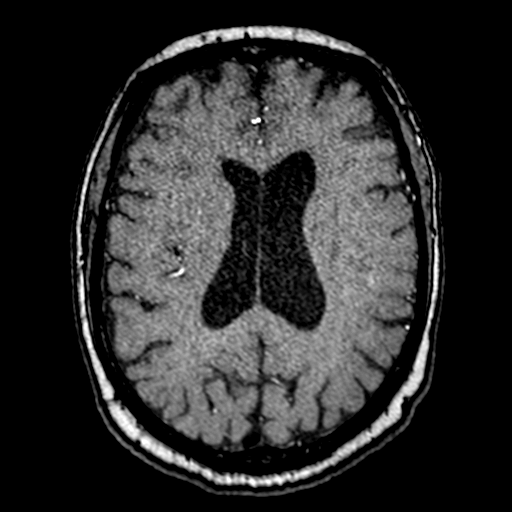

[37 of 48 positions shown; findings below may reference images not displayed]

FINDINGS: Anterior circulation: Carotid arteries, major vessels are diffusely
patent. No major branch occlusion, beading, or aneurysm is seen.
Generalized vessel irregularity from motion artifact.

Posterior circulation: The vertebral and basilar arteries are
smoothly contoured and diffusely patent. Fetal type left PCA. No
branch occlusion, beading, or aneurysm.

Anatomic variants: As above
IMPRESSION: No emergent finding or flow-limiting stenosis. Stable compared to
CTA earlier this month.

## 2021-06-25 MED ORDER — ACETAMINOPHEN 325 MG PO TABS: 650.0000 mg | ORAL_TABLET | ORAL | Status: DC | PRN

## 2021-06-25 MED ORDER — LATANOPROST 0.005 % OP SOLN
1.0000 [drp] | Freq: Every day | OPHTHALMIC | Status: DC
Start: 2021-06-25 — End: 2021-06-26
  Administered 2021-06-25: 1 [drp] via OPHTHALMIC
  Filled 2021-06-25: qty 2.5

## 2021-06-25 MED ORDER — CYCLOSPORINE 0.05 % OP EMUL
1.0000 [drp] | Freq: Two times a day (BID) | OPHTHALMIC | Status: DC
  Administered 2021-06-25 – 2021-06-26 (×2): 1 [drp] via OPHTHALMIC
  Filled 2021-06-25 (×3): qty 1

## 2021-06-25 MED ORDER — ACETAMINOPHEN 160 MG/5ML PO SOLN: 650.0000 mg | ORAL | Status: DC | PRN

## 2021-06-25 MED ORDER — SENNOSIDES-DOCUSATE SODIUM 8.6-50 MG PO TABS: 1.0000 | ORAL_TABLET | Freq: Every evening | ORAL | Status: DC | PRN

## 2021-06-25 MED ORDER — BRINZOLAMIDE 1 % OP SUSP
1.0000 [drp] | Freq: Two times a day (BID) | OPHTHALMIC | Status: DC
  Administered 2021-06-25 – 2021-06-26 (×2): 1 [drp] via OPHTHALMIC
  Filled 2021-06-25: qty 10

## 2021-06-25 MED ORDER — LORAZEPAM 1 MG PO TABS
1.0000 mg | ORAL_TABLET | Freq: Once | ORAL | Status: AC
  Administered 2021-06-25: 1 mg via ORAL
  Filled 2021-06-25: qty 1

## 2021-06-25 MED ORDER — ENOXAPARIN SODIUM 40 MG/0.4ML IJ SOSY
40.0000 mg | PREFILLED_SYRINGE | INTRAMUSCULAR | Status: DC
  Administered 2021-06-25 – 2021-06-26 (×2): 40 mg via SUBCUTANEOUS
  Filled 2021-06-25 (×2): qty 0.4

## 2021-06-25 MED ORDER — ACETAMINOPHEN 650 MG RE SUPP: 650.0000 mg | RECTAL | Status: DC | PRN

## 2021-06-25 MED ORDER — STROKE: EARLY STAGES OF RECOVERY BOOK
Freq: Once | Status: AC
  Filled 2021-06-25: qty 1

## 2021-06-25 MED ORDER — ATORVASTATIN CALCIUM 80 MG PO TABS
80.0000 mg | ORAL_TABLET | Freq: Every day | ORAL | Status: DC
  Administered 2021-06-25: 80 mg via ORAL
  Filled 2021-06-25: qty 1

## 2021-06-25 MED ORDER — ASPIRIN EC 81 MG PO TBEC
81.0000 mg | DELAYED_RELEASE_TABLET | Freq: Every day | ORAL | Status: DC
  Administered 2021-06-25 – 2021-06-26 (×2): 81 mg via ORAL
  Filled 2021-06-25 (×2): qty 1

## 2021-06-25 MED ORDER — CLOPIDOGREL BISULFATE 75 MG PO TABS
75.0000 mg | ORAL_TABLET | Freq: Every day | ORAL | Status: DC
  Administered 2021-06-25 – 2021-06-26 (×2): 75 mg via ORAL
  Filled 2021-06-25 (×2): qty 1

## 2021-06-25 NOTE — Progress Notes (Signed)
Carotid duplex has been completed.   Preliminary results in CV Proc.   Aundra Millet Jeffrie Lofstrom 06/25/2021 9:23 AM

## 2021-06-25 NOTE — ED Notes (Signed)
Tyler Burns daughter [phone] would like an update on the patient

## 2021-06-25 NOTE — Consult Note (Signed)
NEUROLOGY CONSULTATION NOTE   Date of service: June 25, 2021 Patient Name: Tyler Burns MRN:  [id number] DOB:  [birth year] Reason for consult: "Stroke" Requesting Provider: Cy Blamer, MD _ _ _   _ __   _ __ _ _  __ __   _ __   __ _  History of Present Illness   Tyler Burns is a 65 y.o. male with PMH significant for asthma, glaucoma, HLD, AAA s/p repair in 2020, Afibb on aspirin, HTN, recent R frontal and parietal stroke due to significant R ICA stenosis s/p TCAR who presents with a 10-24mins episode of left leg weakness.  He was sitting on a 5 gallon bucket in the afternoon, when his left leg was weak and he could not control it. He is used to to sitting on the bucket and never had this in the past. Has been compliant with his Aspirin and plavix.  Also reports that over the last couple of weeks, has been getting up in the middle of the night feeling a little short of breath. Discussed with his PCP and was directed to see cardiology for concern that this could be due to Afibb.  mRS: 0 NIHSS components Score: Comment  1a Level of Conscious 0[x]  1[]  2[]  3[]      1b LOC Questions 0[x]  1[]  2[]       1c LOC Commands 0[x]  1[]  2[]       2 Best Gaze 0[x]  1[]  2[]       3 Visual 0[x]  1[]  2[]  3[]      4 Facial Palsy 0[x]  1[]  2[]  3[]      5a Motor Arm - left 0[x]  1[]  2[]  3[]  4[]  UN[]    5b Motor Arm - Right 0[x]  1[]  2[]  3[]  4[]  UN[]    6a Motor Leg - Left 0[x]  1[]  2[]  3[]  4[]  UN[]  Mild Left hip flexion weakness.  6b Motor Leg - Right 0[x]  1[]  2[]  3[]  4[]  UN[]    7 Limb Ataxia 0[x]  1[]  2[]  3[]  UN[]     8 Sensory 0[]  1[x]  2[]  UN[]      9 Best Language 0[x]  1[]  2[]  3[]      10 Dysarthria 0[x]  1[]  2[]  UN[]      11 Extinct. and Inattention 0[x]  1[]  2[]       TOTAL: 1       ROS   Constitutional Denies weight loss, fever and chills.   HEENT Denies changes in vision and hearing.   Respiratory Denies SOB and cough.   CV Denies palpitations and CP   GI Denies abdominal pain, nausea, vomiting and  diarrhea.   GU Denies dysuria and urinary frequency.   MSK Denies myalgia and joint pain.   Skin Denies rash and pruritus.   Neurological Denies headache and syncope.   Psychiatric Denies recent changes in mood. Denies anxiety and depression.    Past History   Past Medical History:  Diagnosis Date   Asthma    Dysrhythmia    Afib   GERD (gastroesophageal reflux disease)    Glaucoma    High cholesterol    History of cardiovascular stress test    Myoview 12/19:  EF 50%, No ischemia, Low Risk   Hypertension    Stroke [facility])    August 2022   Past Surgical History:  Procedure Laterality Date   ABDOMINAL AORTIC ANEURYSM REPAIR N/A 05/25/2019   Procedure: ANEURYSM ABDOMINAL AORTIC REPAIR using a HEMASHIELD GOLD VASCULAR GRAFT;  Surgeon: Larina Earthly, MD;  Location: [facility] OR;  Service: Vascular;  Laterality: N/A;  APPENDECTOMY     EYE SURGERY Right    cataract   INGUINAL HERNIA REPAIR Right 06/15/2015   Procedure: RIGHT INGUINAL HERNIA REPAIR WITH MESH;  Surgeon: Avel Peace, MD;  Location: [facility] OR;  Service: General;  Laterality: Right;   INSERTION OF MESH Right 06/15/2015   Procedure: INSERTION OF MESH;  Surgeon: Avel Peace, MD;  Location: [facility] OR;  Service: General;  Laterality: Right;   TRANSCAROTID ARTERY REVASCULARIZATION  Right 06/05/2021   Procedure: RIGHT TRANSCAROTID ARTERY REVASCULARIZATION;  Surgeon: Cephus Shelling, MD;  Location: [facility] OR;  Service: Vascular;  Laterality: Right;   VASCULAR SURGERY     Family History  Problem Relation Age of Onset   Congestive Heart Failure Mother    Heart attack Mother 38   Heart attack Brother 78   Leukemia Brother    Heart failure Sister 35   Stroke Neg Hx    Social History   Socioeconomic History   Marital status: Divorced    Spouse name: Not on file   Number of children: Not on file   Years of education: Not on file   Highest education level: Not on file  Occupational History   Not on file  Tobacco Use   Smoking  status: Former    Types: Cigarettes    Quit date: 1970    Years since quitting: 52.6   Smokeless tobacco: Never  Vaping Use   Vaping Use: Never used  Substance and Sexual Activity   Alcohol use: No   Drug use: No   Sexual activity: Not on file  Other Topics Concern   Not on file  Social History Narrative   Not on file   Social Determinants of Health   Financial Resource Strain: Not on file  Food Insecurity: Not on file  Transportation Needs: Not on file  Physical Activity: Not on file  Stress: Not on file  Social Connections: Not on file   Allergies  Allergen Reactions   Brinzolamide-Brimonidine Shortness Of Breath    Feels he has difficulty breathing when on medication Eye drop medication    Medications  (Not in a hospital admission)    Vitals   Vitals:   06/24/21 1937 06/24/21 2230 06/25/21 0118 06/25/21 0345  BP: (!) 155/112 (!) 143/109 (!) 155/91 (!) 168/93  Pulse: 63 65 67 70  Resp: 17 17 17 17   Temp:      TempSrc:      SpO2: 100% 98% 98% 99%     There is no height or weight on file to calculate BMI.  Physical Exam   General: Laying comfortably in bed; in no acute distress.  HENT: Normal oropharynx and mucosa. Normal external appearance of ears and nose.  Neck: Supple, no pain or tenderness  CV: No JVD. No peripheral edema.  Pulmonary: Symmetric Chest rise. Normal respiratory effort.  Abdomen: Soft to touch, non-tender.  Ext: No cyanosis, edema, or deformity  Skin: No rash. Normal palpation of skin.   Musculoskeletal: Normal digits and nails by inspection. No clubbing.   Neurologic Examination  Mental status/Cognition: Alert, oriented to self, place, month and year, good attention.  Speech/language: Fluent, comprehension intact, object naming intact, repetition intact.  Cranial nerves:   CN II Pupils equal and reactive to light, no VF deficits    CN III,IV,VI EOM intact, no gaze preference or deviation, no nystagmus    CN V normal sensation in  V1, V2, and V3 segments bilaterally    CN VII no asymmetry, no  nasolabial fold flattening    CN VIII normal hearing to speech    CN IX & X normal palatal elevation, no uvular deviation    CN XI 5/5 head turn and 5/5 shoulder shrug bilaterally    CN XII midline tongue protrusion    Motor:  Muscle bulk: normal, tone normal, pronator drift none tremor none Mvmt Root Nerve  Muscle Right Left Comments  SA C5/6 Ax Deltoid 5 5   EF C5/6 Mc Biceps 5 5   EE C6/7/8 Rad Triceps 5 5   WF C6/7 Med FCR     WE C7/8 PIN ECU     F Ab C8/T1 U ADM/FDI 5 5   HF L1/2/3 Fem Illopsoas 5 4+   KE L2/3/4 Fem Quad 5 4+   DF L4/5 D Peron Tib Ant 5 5   PF S1/2 Tibial Grc/Sol 5 5    Reflexes:  Right Left Comments  Pectoralis      Biceps (C5/6) 2 2   Brachioradialis (C5/6) 2 2    Triceps (C6/7) 2 2    Patellar (L3/4) 2 2    Achilles (S1)      Hoffman      Plantar     Jaw jerk    Sensation:  Light touch Mildly decreased to touch in L face.   Pin prick    Temperature    Vibration   Proprioception    Coordination/Complex Motor:  - Finger to Nose intact BL - Heel to shin intact BL - Rapid alternating movement are slowed in LUE - Gait: Deferred.  Labs   CBC:  Recent Labs  Lab 06/24/21 1656  WBC 7.5  NEUTROABS 5.3  HGB 14.8  HCT 45.0  MCV 93.4  PLT 354    Basic Metabolic Panel:  Lab Results  Component Value Date   NA 137 06/24/2021   K 4.3 06/24/2021   CO2 27 06/24/2021   GLUCOSE 104 (H) 06/24/2021   BUN 16 06/24/2021   CREATININE 1.03 06/24/2021   CALCIUM 9.2 06/24/2021   GFRNONAA >60 06/24/2021   GFRAA >60 05/26/2019   Lipid Panel:  Lab Results  Component Value Date   LDLCALC 69 06/06/2021   HgbA1c:  Lab Results  Component Value Date   HGBA1C 6.3 (H) 05/27/2021   Urine Drug Screen: No results found for: LABOPIA, COCAINSCRNUR, LABBENZ, AMPHETMU, THCU, LABBARB  Alcohol Level No results found for: [facility]  MR Angio head without contrast and Carotid Duplex  BL: pending  MRI Brain(personally reviwed): Small acute to subacute infarcts in the high right frontal lobe (ACA and MCA distribution) since comparison 05/27/2021. There has been expected fading of other recent infarcts in this territory on prior. 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   Impression   Tyler Burns is a 65 y.o. male with PMH significant for asthma, glaucoma, HLD, AAA s/p repair in 2020, Afibb on aspirin, HTN, recent R frontal and parietal stroke due to significant R ICA stenosis s/p TCAR who presents with a 10-8715mins episode of left leg weakness. Found to have new small acute to subacute high R frontal lobe infarcts. His neurologic examination is notable for mild L hip flexion weakness and mildly decreased sensation to touch in L face.  Likely embolic stroke from large artery atherosclerosis vs paroxysmal Afibb. Given recent TCAR, will get vessel imaging to evaluate patency of the R ICA stent.  Primary Diagnosis:  Cerebral infarction due to occlusion  or stenosis of right carotid artery.   Secondary Diagnosis: Essential (primary) hypertension and Paroxysmal atrial fibrillation  Recommendations   - Frequent Neuro checks per stroke unit protocol - Recommend brain imaging with MRI Brain without contrast - Recommend Vascular imaging with MRA Angio Head without contrast and US Carotid doppler - Given that he recently had a TTE, I do not think that repeat TTE will be useful. - Recommend obtaining Lipid panel with LDL - Please start statin if LDL > 70 - Recommend HbA1c - Antithrombotic - continue Aspirin 81mg  daily along with plavix 75mg  daily. - Recommend DVT ppx - SBP goal - permissive hypertension first 24 h < 220/110. Held home meds.  - Recommend Telemetry monitoring for arrythmia - Recommend bedside swallow screen prior to PO intake. - Stroke education booklet - Recommend PT/OT/SLP consult - Stroke team to  follow __________________________________________________________________  Plan discussed with Dr. .   Thank you for the opportunity to take part in the care of this patient. If you have any further questions, please contact the neurology consultation attending.  Signed,  [facility] Pager Number Nicanor Alcon _ _ _   _ __   _ __ _ _  __ __   _ __   __ _

## 2021-06-25 NOTE — ED Notes (Signed)
Patient transported to MRI 

## 2021-06-25 NOTE — ED Notes (Signed)
Pt transported to Vascular 

## 2021-06-25 NOTE — Hospital Course (Addendum)
Tyler Burns is a 65 y.o. with pertinent PMH of hypertension, atrial fibrillation no on anticoagulation, hyperlipidemia, AAA s/p repair in 2020, recent right frontal and parietal CVA 2/2 significant R ICA stenosis s/p TCAR who presented with transient left leg weakness and admit for acute CVA.    Subacute ACA MCA Stroke likely periprocedural and clinically silent Sciatica left leg with transient weakness which has resolved Patient presented with transient left lower extremity weakness that has resolved on my exam. MRI Brain with acute and subacute infarcts in the right frontal lobe in the ACA and MCA distribution. MRA Head without flow limiting stenosis noted. Of note, patient had similar CVA earlier this month and was found to have significant right carotid stenosis. He is s/p stent. Repeat carotid US with <50% stenosis of R carotid. He has been on dual antiplatelet therapy with aspirin and plavix since discharge. Pt has been back to his baseline since admission. Suspect that subacute stroke on MRI are in the setting or TCAR and are not contributing to his presenting sx of left sided leg weakness. More than likely his left leg movement was impaired due to sciatica, considering his sitting position on the bucket and back pain when he got up from the bucket. MRI spine negative. Most recent A1c 6.3 and lipid panel with total cholesterol 108 and LDL 69 earlier this month. A dietician was consulted as well. Etiology of patient's recurrent CVA's is likely persistent atrial fibrillation as he is not currently on anticoagulation. When planing for therapy at discharge, this was discussed with his vascular surgeon, who recommended stopping Asprin, continuing plavix, and starting anticoagulation, for which eliquis is prescribed at discharge. - Continued aspirin and plavix   - Continued atorvastatin 80mg  daily  - Permissive HTN with goal <220/110 for first 24 hrs - Held off on repeating Echo, A1c and lipid panel as  these were performed <1 month prior - Plan for starting anticoagulation at discharge  - PT/OT eval    Permanent atrial fibrillation CHA2DS2-VASc score 4. Patient has a history of permanent atrial fibrillation for which he is not on anticoagulation at this time. Patient was recommended for starting Eliquis after completing three weeks of dual antiplatelet therapy but has not been able to do so. Patient endorsed intermittent palpitations and SOB. He was recommended for follow up with cardiology; however, has not been able to do so. He is on metoprolol 50mg  daily for rate control. HR in 60's at this time and he is asymptomatic. - Held metoprolol at this time in setting of permissive HTN for acute CVA as above - Follow up with cardiology at discharge - Plan for starting anticoagulation at discharge  - Cardiac monitoring    Hypertension Held metoprolol for permissive HTN   Hyperlipidemia  Continue atorvastatin 80mg  daily    R carotid stenosis s/p TCAR  Patient was noted to have significant right carotid stenosis during prior hospitalization for stroke. He was referred to outpatient vascular surgery and had right transcarotid artery revascularization. Carotid today with <50% stenosis of R carotid stent. - Continued aspirin and plavix as above, with discharge plans to start anticoagulation and stopping aspirin to prevent triple therapy.

## 2021-06-25 NOTE — Progress Notes (Signed)
Physical Therapy Evaluation & Discharge Patient Details Name: Tyler Burns MRN: [id number] DOB: 16-Jul-1956 Today's Date: 06/25/2021   History of Present Illness  Pt is a 65yo male presenting [facility] ED on 8/29 for LLE numbness and weakness. MRI brain: Small acute to subacute infarcts in R frontal lobe (ACA & MCA). PMH: asthma, AAA s/p repair, Afib, HTN, low back pain, R frontal and parietal CVA, R ICA stenosis s/p TCAR.   Clinical Impression  Pt presents with the impairments above and problems listed below. Pt supervision to min guard for all mobility tasks, no physical assist required. No LOB noted when performing DGI tasks. Educated pt on the BE FAST stroke signs and pt verbalized understanding. Encouraged pt to continue previous activity levels as tolerated upon discharge for general health. Pt reports he is at his baseline. No further skilled PT needs. We will sign off. Should his needs change, please re-consult. Thank you for this referral.     Follow Up Recommendations No PT follow up    Equipment Recommendations  None recommended by PT    Recommendations for Other Services       Precautions / Restrictions Precautions Precautions: None Restrictions Weight Bearing Restrictions: No      Mobility  Bed Mobility Overal bed mobility: Needs Assistance Bed Mobility: Supine to Sit;Sit to Supine     Supine to sit: Supervision;Min guard Sit to supine: Supervision;Min guard   General bed mobility comments: Pt supervision to min guard for safety only.    Transfers Overall transfer level: Needs assistance Equipment used: None Transfers: Sit to/from Stand Sit to Stand: Supervision         General transfer comment: Pt required supervision for safety only.  Ambulation/Gait Ambulation/Gait assistance: Min guard;Supervision Gait Distance (Feet): 150 Feet Assistive device: None Gait Pattern/deviations: Step-through pattern;Decreased step length - right;Decreased step length -  left Gait velocity: decreased   General Gait Details: Pt min guard to supervision for safety only with no overt LOB noted. Completed DGI tasks without LOB.  Stairs            Wheelchair Mobility    Modified Rankin (Stroke Patients Only) Modified Rankin (Stroke Patients Only) Pre-Morbid Rankin Score: Slight disability Modified Rankin: Moderately severe disability     Balance Overall balance assessment: Needs assistance Sitting-balance support: Feet supported Sitting balance-Leahy Scale: Good     Standing balance support: No upper extremity supported;During functional activity Standing balance-Leahy Scale: Good                   Standardized Balance Assessment Standardized Balance Assessment : Dynamic Gait Index   Dynamic Gait Index Level Surface: Normal Change in Gait Speed: Normal Gait with Horizontal Head Turns: Mild Impairment Gait with Vertical Head Turns: Mild Impairment Gait and Pivot Turn: Mild Impairment Step Over Obstacle: Normal Step Around Obstacles: Normal Steps: Mild Impairment Total Score: 20       Pertinent Vitals/Pain Pain Assessment: No/denies pain    Home Living Family/patient expects to be discharged to:: Private residence Living Arrangements: Spouse/significant other (Girlfriend) Available Help at Discharge: Family;Available 24 hours/day (Girlfriend; daughter comes by PRN) Type of Home: House Home Access: Ramped entrance     Home Layout: One level Home Equipment: Shower seat;Grab bars - tub/shower;Cane - single point;Crutches;Bedside commode;Toilet riser;Walker - 2 wheels      Prior Function Level of Independence: Independent         Comments: retired;disabled     Hand Dominance   Dominant Hand: Right  Extremity/Trunk Assessment   Upper Extremity Assessment Upper Extremity Assessment: RUE deficits/detail;LUE deficits/detail RUE Deficits / Details: Strength grossly 5/5 RUE Sensation: WNL RUE Coordination:  WNL LUE Deficits / Details: Strength grossly 5/5 LUE Sensation: WNL LUE Coordination: WNL    Lower Extremity Assessment Lower Extremity Assessment: RLE deficits/detail;LLE deficits/detail RLE Deficits / Details: Strength grossly 5/5 RLE Sensation: WNL RLE Coordination: WNL LLE Deficits / Details: Strength grossly 5/5 LLE Sensation:  (Pt reports hypersensitivity, but it is not painful.) LLE Coordination: WNL    Cervical / Trunk Assessment Cervical / Trunk Assessment: Other exceptions Cervical / Trunk Exceptions: History of back injury and AAA repair.  Communication   Communication: No difficulties  Cognition Arousal/Alertness: Awake/alert Behavior During Therapy: WFL for tasks assessed/performed Overall Cognitive Status: Within Functional Limits for tasks assessed                                        General Comments General comments (skin integrity, edema, etc.): Pt has healing surgical incision at base of R neck from stent placement 05/27/2021. Educated pt on how to stretch neck and on scar tissue massage once fully healed.    Exercises     Assessment/Plan    PT Assessment Patent does not need any further PT services  PT Problem List         PT Treatment Interventions      PT Goals (Current goals can be found in the Care Plan section)  Acute Rehab PT Goals Patient Stated Goal: to go home PT Goal Formulation: With patient Time For Goal Achievement: 06/25/21 Potential to Achieve Goals: Good    Frequency     Barriers to discharge        Co-evaluation               AM-PAC PT "6 Clicks" Mobility  Outcome Measure Help needed turning from your back to your side while in a flat bed without using bedrails?: None Help needed moving from lying on your back to sitting on the side of a flat bed without using bedrails?: None Help needed moving to and from a bed to a chair (including a wheelchair)?: A Little Help needed standing up from a chair  using your arms (e.g., wheelchair or bedside chair)?: A Little Help needed to walk in hospital room?: A Little Help needed climbing 3-5 steps with a railing? : A Little 6 Click Score: 20    End of Session Equipment Utilized During Treatment: Gait belt Activity Tolerance: Patient tolerated treatment well Patient left: in bed;with call bell/phone within reach (on stretcher in ED) Nurse Communication: Mobility status PT Visit Diagnosis: Other symptoms and signs involving the nervous system (R29.898)    Time: 2094-7096 PT Time Calculation (min) (ACUTE ONLY): 31 min   Charges:   PT Evaluation $PT Eval Low Complexity: 1 Low PT Treatments $Gait Training: 8-22 mins        Johnn Hai, SPT Johnn Hai 06/25/2021, 5:00 PM

## 2021-06-25 NOTE — ED Notes (Signed)
IV team at bedside 

## 2021-06-25 NOTE — Plan of Care (Signed)
Min assist with adls 

## 2021-06-25 NOTE — ED Notes (Signed)
Pt eating lunch at this time

## 2021-06-25 NOTE — Progress Notes (Addendum)
STROKE TEAM PROGRESS NOTE    Interval History   No acute events overnight. Patient is resting in bed with no family at bedside. Asked him to recount what happened yesterday. He states that he was sitting on a bucket and when he got up he felt a pain in his back and his leg was not moving properly. This resolved shortly and he feels that he is back at baseline.   He endorses a history of ruptured disc/degenerative disc disease. Last imaging of the lumbar spine per chart review was in 2006 w/no significant degenerative change.   Examination was pertinent for a positive straight leg test. Will proceed with MRI Lumbar Spine to evaluate for herniated disc/disc disease.   Pertinent Lab Work and Imaging    06/26/21 MRI Brain WO IV Contrast Small acute to subacute infarcts in the high right frontal lobe (ACA and MCA distribution) since comparison 05/27/2021. There has been expected fading of other recent infarcts in this territory on prior.This is likely recurrent embolic disease from the patient's severe right ICA stenosis in the neck.  06/25/21 MR Angio Head WO Contrast  No emergent finding or flow-limiting stenosis. Stable compared to CTA earlier this month.  06/25/21 CUS  Right Carotid: Velocities in the right ICA are consistent with a 60-79% stenosis.   Left Carotid: Velocities in the left ICA are consistent with a 1-39% stenosis.   Vertebrals: Bilateral vertebral arteries demonstrate antegrade flow.   Physical Examination   Constitutional: Calm, appropriate for condition  Cardiovascular: Normal RR Respiratory: No increased WOB   Mental status: AAOx4 Speech: Fluent repetition and naming intact  Cranial nerves: EOMI, VFF, Face symmetric, Tongue midline  Motor: Normal bulk and tone. No drift. Strength 5/5 throughout + straight leg test  Sensory: Sensation intact to light touch  Coordination: Intact FNF  Gait: Deferred   NIHSS: 0  Assessment and Plan   [name] is a 65  y.o. male w/pmh of asthma, glaucoma, HLD, AAA s/p repair in 2020, Afibb on aspirin, HTN, recent R frontal and parietal stroke due to significant R ICA stenosis s/p TCAR who presents with an episode of left leg weakness that lasted for 15 minutes.   #Subacute ACA MCA Stroke  #Sciatica  Patient presented with the symptoms described above. Of note, he is familiar to the stroke service- was admitted on 05/27/21 with left sided weakness and underwent full stroke work up. He was found to have acute right frontal and parietal stroke, cryptogenic etiology in the setting of atheroembolism given RICA stenosis versus atrial fibrillation. Recommended that he be evaluated by vascular surgery for intervention. He was seen by them on an outpatient basis and underwent TCAR on 06/05/21.  MRI Brain this admission reveals new stroke in comparison to prior MRI that appear subacute. MRA Head negative for significant stenosis and stable compared to 05/27/21 CTA Head and Neck. CUS also showed stable stenosis. LDL 69, hemoglobin a1c 6.3  Suspect that subacute stroke on MRI are in the setting or TCAR and are not contributing to his presenting sx of left sided leg weakness. More than likely his left leg movement was impaired due to sciatica, considering his sitting position on the bucket and back pain when he got up from the bucket.   Recommendations:  - MRI Lumbar spine to evaluate for herniated disc/degenerative disc disease  - Continue DAPT and Atorvastatin 80 for stroke risk reduction  - No need for permissive hypertension or echocardiogram this admission   From  a neurology standpoint he is ok to discharge after work up is done.   Hospital day # 0  Stark Jock, NP  [facility] Nurse Practitioner Patient seen and discussed with attending physician Dr. Priscille Heidelberg MD NOTE :  I have personally obtained history,examined this patient, reviewed notes, independently viewed imaging studies, participated in  medical decision making and plan of care.ROS completed by me personally and pertinent positives fully documented  I have made any additions or clarifications directly to the above note. Agree with note above.  Patient presented with sudden onset of left leg weakness accompanying back pain and left hip pain which appears to have resolved.  Neurological exam at present is nonfocal.  MRI scan of the brain shows tiny punctate weakly diffusion positive lesion in the right frontal lobe but it is not correspondingly dark on ADC map and likely represent subacute infarct in May infarct within the periprocedural from the TCAR procedure he had 3 weeks ago.  Carotid ultrasound repeated today shows patent right carotid stent without significant restenosis.  Recommend imaging lumbar spine to look for degenerative disc disease.  Continue aspirin and Plavix for his carotid stent and aggressive risk factor modification.  Patient may be discharged later today.  Continue aggressive risk factor modification. Discussed with internal medicine teaching service.  Greater than 50% time during the 35-minute visit were spent in counseling and coordination of care and discussion with care team. Delia Heady, MD Medical Director [facility] Pager: [phone] 06/25/2021 12:29 PM  To contact Stroke Continuity provider, please refer to [email or web address]. After hours, contact General Neurology

## 2021-06-25 NOTE — ED Notes (Signed)
This RN and second [employer], unable to obtain IV access

## 2021-06-25 NOTE — ED Provider Notes (Signed)
[facility] EMERGENCY DEPARTMENT Provider Note   CSN: [id number] Arrival date & time: 06/24/21  1648     History Chief Complaint  Patient presents with   Numbness    Tyler Burns is a 65 y.o. male.  The history is provided by the patient.  Illness Location:  L lower extremity Quality:  "hobbling" Severity:  Moderate Onset quality:  Gradual Duration:  15 minutes Timing:  Constant Progression:  Resolved Chronicity:  New Context:  Previous CVA Relieved by:  Time Worsened by:  Nothing Ineffective treatments:  Nothing Associated symptoms: no abdominal pain, no chest pain, no congestion, no cough, no diarrhea, no fever, no loss of consciousness, no rash and no shortness of breath   Risk factors:  Prior stroke     Past Medical History:  Diagnosis Date   Asthma    Dysrhythmia    Afib   GERD (gastroesophageal reflux disease)    Glaucoma    High cholesterol    History of cardiovascular stress test    Myoview 12/19:  EF 50%, No ischemia, Low Risk   Hypertension    Stroke [facility])    August 2022    Patient Active Problem List   Diagnosis Date Noted   Carotid artery stenosis 06/05/2021   Carotid artery stenosis, symptomatic, right 06/05/2021   Essential hypertension 05/28/2021   Hyperlipidemia 05/28/2021   Cerebral infarction (HCC) 05/27/2021   AAA (abdominal aortic aneurysm) (HCC) 05/25/2019   Atrial fibrillation (HCC) 09/20/2013    Past Surgical History:  Procedure Laterality Date   ABDOMINAL AORTIC ANEURYSM REPAIR N/A 05/25/2019   Procedure: ANEURYSM ABDOMINAL AORTIC REPAIR using a HEMASHIELD GOLD VASCULAR GRAFT;  Surgeon: Larina Earthly, MD;  Location: [facility] OR;  Service: Vascular;  Laterality: N/A;   APPENDECTOMY     EYE SURGERY Right    cataract   INGUINAL HERNIA REPAIR Right 06/15/2015   Procedure: RIGHT INGUINAL HERNIA REPAIR WITH MESH;  Surgeon: Avel Peace, MD;  Location: [facility] OR;  Service: General;  Laterality: Right;   INSERTION OF  MESH Right 06/15/2015   Procedure: INSERTION OF MESH;  Surgeon: Avel Peace, MD;  Location: [facility] OR;  Service: General;  Laterality: Right;   TRANSCAROTID ARTERY REVASCULARIZATION  Right 06/05/2021   Procedure: RIGHT TRANSCAROTID ARTERY REVASCULARIZATION;  Surgeon: Cephus Shelling, MD;  Location: [facility] OR;  Service: Vascular;  Laterality: Right;   VASCULAR SURGERY         Family History  Problem Relation Age of Onset   Congestive Heart Failure Mother    Heart attack Mother 20   Heart attack Brother 59   Leukemia Brother    Heart failure Sister 45   Stroke Neg Hx     Social History   Tobacco Use   Smoking status: Former    Types: Cigarettes    Quit date: 1970    Years since quitting: 52.6   Smokeless tobacco: Never  Vaping Use   Vaping Use: Never used  Substance Use Topics   Alcohol use: No   Drug use: No    Home Medications Prior to Admission medications   Medication Sig Start Date End Date Taking? Authorizing Provider  aspirin EC 81 MG tablet Take 1 tablet (81 mg total) by mouth daily. 09/30/18   Corky Crafts, MD  atorvastatin (LIPITOR) 80 MG tablet Take 1 tablet (80 mg total) by mouth daily with supper. 05/29/21 06/28/21  Carmel Sacramento, MD  bimatoprost (LUMIGAN) 0.01 % SOLN Place 1 drop into the  left eye at bedtime.     [provider]  brinzolamide (AZOPT) 1 % ophthalmic suspension Place 1 drop into both eyes 2 (two) times a day.    [provider]  clopidogrel (PLAVIX) 75 MG tablet Take 1 tablet (75 mg total) by mouth daily. 06/06/21 08/05/21  Emilie Rutter, PA-C  metoprolol succinate (TOPROL-XL) 50 MG 24 hr tablet Take 50 mg by mouth daily. Take with or immediately following a meal.    [provider]  Omega-3 Fatty Acids (FISH OIL) 600 MG CAPS Take 600 mg by mouth daily.     [provider]  RESTASIS 0.05 % ophthalmic emulsion Place 1 drop into both eyes every 12 (twelve) hours. 10/18/19   [provider]  vitamin  E 400 UNIT capsule Take 400 Units by mouth daily.     [provider]    Allergies    Brinzolamide-brimonidine  Review of Systems   Review of Systems  Constitutional:  Negative for fever.  HENT:  Negative for congestion.   Eyes:  Negative for photophobia.  Respiratory:  Negative for cough and shortness of breath.   Cardiovascular:  Negative for chest pain.  Gastrointestinal:  Negative for abdominal pain and diarrhea.  Genitourinary:  Negative for difficulty urinating.  Musculoskeletal:  Negative for neck stiffness.  Skin:  Negative for rash.  Neurological:  Negative for loss of consciousness and numbness.  Psychiatric/Behavioral:  Negative for agitation.   All other systems reviewed and are negative.  Physical Exam Updated Vital Signs BP (!) 168/93 (BP Location: Left Arm)   Pulse 70   Temp 98 F (36.7 C) (Oral)   Resp 17   SpO2 99%   Physical Exam Vitals and nursing note reviewed.  Constitutional:      General: He is not in acute distress.    Appearance: Normal appearance.  HENT:     Head: Normocephalic and atraumatic.     Nose: Nose normal.  Eyes:     Conjunctiva/sclera: Conjunctivae normal.     Pupils: Pupils are equal, round, and reactive to light.  Cardiovascular:     Rate and Rhythm: Normal rate and regular rhythm.     Pulses: Normal pulses.     Heart sounds: Normal heart sounds.  Pulmonary:     Effort: Pulmonary effort is normal.     Breath sounds: Normal breath sounds.  Abdominal:     General: Abdomen is flat. Bowel sounds are normal.     Palpations: Abdomen is soft.     Tenderness: There is no abdominal tenderness. There is no guarding.  Musculoskeletal:        General: Normal range of motion.     Cervical back: Normal range of motion and neck supple.  Skin:    General: Skin is warm and dry.     Capillary Refill: Capillary refill takes less than 2 seconds.  Neurological:     General: No focal deficit present.     Mental Status: He is alert  and oriented to person, place, and time.     Cranial Nerves: No cranial nerve deficit.     Deep Tendon Reflexes: Reflexes normal.  Psychiatric:        Mood and Affect: Mood normal.        Behavior: Behavior normal.    ED Results / Procedures / Treatments   Labs (all labs ordered are listed, but only abnormal results are displayed) Results for orders placed or performed during the hospital encounter of 06/24/21  Basic metabolic panel  Result Value Ref Range   Sodium 137 135 - 145 mmol/L   Potassium 4.3 3.5 - 5.1 mmol/L   Chloride 103 98 - 111 mmol/L   CO2 27 22 - 32 mmol/L   Glucose, Bld 104 (H) 70 - 99 mg/dL   BUN 16 8 - 23 mg/dL   Creatinine, Ser 0.94 0.61 - 1.24 mg/dL   Calcium 9.2 8.9 - 70.9 mg/dL   GFR, Estimated >62 >83 mL/min   Anion gap 7 5 - 15  CBC with Differential  Result Value Ref Range   WBC 7.5 4.0 - 10.5 K/uL   RBC 4.82 4.22 - 5.81 MIL/uL   Hemoglobin 14.8 13.0 - 17.0 g/dL   HCT 66.2 94.7 - 65.4 %   MCV 93.4 80.0 - 100.0 fL   MCH 30.7 26.0 - 34.0 pg   MCHC 32.9 30.0 - 36.0 g/dL   RDW 65.0 35.4 - 65.6 %   Platelets 354 150 - 400 K/uL   nRBC 0.0 0.0 - 0.2 %   Neutrophils Relative % 70 %   Neutro Abs 5.3 1.7 - 7.7 K/uL   Lymphocytes Relative 19 %   Lymphs Abs 1.4 0.7 - 4.0 K/uL   Monocytes Relative 8 %   Monocytes Absolute 0.6 0.1 - 1.0 K/uL   Eosinophils Relative 2 %   Eosinophils Absolute 0.2 0.0 - 0.5 K/uL   Basophils Relative 1 %   Basophils Absolute 0.1 0.0 - 0.1 K/uL   Immature Granulocytes 0 %   Abs Immature Granulocytes 0.03 0.00 - 0.07 K/uL  Urinalysis, Routine w reflex microscopic Urine, Clean Catch  Result Value Ref Range   Color, Urine YELLOW YELLOW   APPearance CLEAR CLEAR   Specific Gravity, Urine 1.006 1.005 - 1.030   pH 6.0 5.0 - 8.0   Glucose, UA NEGATIVE NEGATIVE mg/dL   Hgb urine dipstick NEGATIVE NEGATIVE   Bilirubin Urine NEGATIVE NEGATIVE   Ketones, ur NEGATIVE NEGATIVE mg/dL   Protein, ur NEGATIVE NEGATIVE mg/dL   Nitrite  NEGATIVE NEGATIVE   Leukocytes,Ua TRACE (A) NEGATIVE   RBC / HPF 0-5 0 - 5 RBC/hpf   WBC, UA 0-5 0 - 5 WBC/hpf   Bacteria, UA NONE SEEN NONE SEEN   Mucus PRESENT    CT Angio Head W or Wo Contrast  Result Date: 05/27/2021 CLINICAL DATA:  Left arm weakness. Neuro deficit, acute, stroke suspected. EXAM: CT ANGIOGRAPHY HEAD AND NECK TECHNIQUE: Multidetector CT imaging of the head and neck was performed using the standard protocol during bolus administration of intravenous contrast. Multiplanar CT image reconstructions and MIPs were obtained to evaluate the vascular anatomy. Carotid stenosis measurements (when applicable) are obtained utilizing NASCET criteria, using the distal internal carotid diameter as the denominator. CONTRAST:  19mL OMNIPAQUE IOHEXOL 350 MG/ML SOLN COMPARISON:  Head CT same day FINDINGS: CTA NECK FINDINGS Aortic arch: Aortic atherosclerosis. Branching pattern is normal without origin stenosis. Right carotid system: Common carotid artery shows some scattered plaque but is patent to the bifurcation. No stenosis greater than 20%. At carotid bifurcation, there is soft and calcified plaque. Very severe stenosis of the internal carotid artery at the distal bulb, where the diameter measures 1 mm or less. Compared to an expected distal ICA diameter of 5 mm, this would be a stenosis of 80% or greater. There is some flow reduced diameter of the distal ICA on today's study. Left carotid system: Common carotid artery shows some scattered plaque but is patent to the bifurcation  without stenosis greater than 20%. Soft and calcified plaque at the carotid bifurcation and ICA bulb. Minimal diameter in the distal bulb is 2.5 mm. Compared to a more distal cervical ICA diameter of 5 mm, this indicates a 50% stenosis. Vertebral arteries: Both vertebral arteries show calcified plaque at their origins with stenosis estimated at 50% on the right and 30-50% on the left. Beyond the origins, the vessels are widely  patent through the cervical region to the foramen magnum. Skeleton: Ordinary minimal spondylosis. Other neck: No mass or lymphadenopathy. Upper chest: Pleural and parenchymal scarring at the lung apices. No active process evident. Review of the MIP images confirms the above findings CTA HEAD FINDINGS Anterior circulation: Both internal carotid arteries are patent through the skull base and siphon regions. There is siphon atherosclerotic calcification with stenosis estimated at 30% on both sides. The anterior and middle cerebral vessels are patent. No large vessel occlusion. Distal vessels do show some atherosclerotic irregularity. Posterior circulation: Both vertebral arteries are patent through the foramen magnum to the basilar. No basilar stenosis. Posterior circulation branch vessels show flow. Left PCA takes fetal origin from the anterior circulation. Venous sinuses: Patent and normal. Anatomic variants: None significant. Review of the MIP images confirms the above findings IMPRESSION: No intracranial large vessel occlusion. No correctable intracranial proximal stenosis. Distal vessels show atherosclerotic irregularity. Aortic Atherosclerosis (ICD10-I70.0). Atherosclerotic disease at both carotid bifurcations. Extensive primarily soft plaque at the bifurcation and ICA bulb on the right. ICA lumen 1 mm or less, indicating greater than 80% stenosis. Atherosclerotic plaque at the left carotid bifurcation with maximal stenosis in the distal bulb measuring 50%. Bilateral vertebral artery origin stenoses, 50% on the right and 30-50% on the left. Electronically Signed   By: Paulina FusiMark  Shogry M.D.   On: 05/27/2021 13:11   CT Head Wo Contrast  Result Date: 05/27/2021 CLINICAL DATA:  65 year old male with history of left arm weakness. Possible cerebrovascular accident. EXAM: CT HEAD WITHOUT CONTRAST TECHNIQUE: Contiguous axial images were obtained from the base of the skull through the vertex without intravenous contrast.  COMPARISON:  Head CT 04/16/2010. FINDINGS: Brain: Patchy and confluent areas of decreased attenuation are noted throughout the deep and periventricular white matter of the cerebral hemispheres bilaterally, compatible with chronic microvascular ischemic disease. Tiny cortical calcification in the posterior right frontal region (axial image 22 of series 3), new compared to the prior study, likely sequela of prior infection. No evidence of acute infarction, hemorrhage, hydrocephalus, extra-axial collection or mass lesion/mass effect. Vascular: No hyperdense vessel or unexpected calcification. Skull: Normal. Negative for fracture or focal lesion. Sinuses/Orbits: No acute finding. Other: None. IMPRESSION: 1. No acute intracranial abnormalities. 2. Chronic microvascular ischemic changes in the cerebral white matter, as above. Electronically Signed   By: Trudie Reedaniel  Entrikin M.D.   On: 05/27/2021 10:31   CT Angio Neck W and/or Wo Contrast  Result Date: 05/27/2021 CLINICAL DATA:  Left arm weakness. Neuro deficit, acute, stroke suspected. EXAM: CT ANGIOGRAPHY HEAD AND NECK TECHNIQUE: Multidetector CT imaging of the head and neck was performed using the standard protocol during bolus administration of intravenous contrast. Multiplanar CT image reconstructions and MIPs were obtained to evaluate the vascular anatomy. Carotid stenosis measurements (when applicable) are obtained utilizing NASCET criteria, using the distal internal carotid diameter as the denominator. CONTRAST:  80mL OMNIPAQUE IOHEXOL 350 MG/ML SOLN COMPARISON:  Head CT same day FINDINGS: CTA NECK FINDINGS Aortic arch: Aortic atherosclerosis. Branching pattern is normal without origin stenosis. Right carotid system: Common carotid artery  shows some scattered plaque but is patent to the bifurcation. No stenosis greater than 20%. At carotid bifurcation, there is soft and calcified plaque. Very severe stenosis of the internal carotid artery at the distal bulb, where  the diameter measures 1 mm or less. Compared to an expected distal ICA diameter of 5 mm, this would be a stenosis of 80% or greater. There is some flow reduced diameter of the distal ICA on today's study. Left carotid system: Common carotid artery shows some scattered plaque but is patent to the bifurcation without stenosis greater than 20%. Soft and calcified plaque at the carotid bifurcation and ICA bulb. Minimal diameter in the distal bulb is 2.5 mm. Compared to a more distal cervical ICA diameter of 5 mm, this indicates a 50% stenosis. Vertebral arteries: Both vertebral arteries show calcified plaque at their origins with stenosis estimated at 50% on the right and 30-50% on the left. Beyond the origins, the vessels are widely patent through the cervical region to the foramen magnum. Skeleton: Ordinary minimal spondylosis. Other neck: No mass or lymphadenopathy. Upper chest: Pleural and parenchymal scarring at the lung apices. No active process evident. Review of the MIP images confirms the above findings CTA HEAD FINDINGS Anterior circulation: Both internal carotid arteries are patent through the skull base and siphon regions. There is siphon atherosclerotic calcification with stenosis estimated at 30% on both sides. The anterior and middle cerebral vessels are patent. No large vessel occlusion. Distal vessels do show some atherosclerotic irregularity. Posterior circulation: Both vertebral arteries are patent through the foramen magnum to the basilar. No basilar stenosis. Posterior circulation branch vessels show flow. Left PCA takes fetal origin from the anterior circulation. Venous sinuses: Patent and normal. Anatomic variants: None significant. Review of the MIP images confirms the above findings IMPRESSION: No intracranial large vessel occlusion. No correctable intracranial proximal stenosis. Distal vessels show atherosclerotic irregularity. Aortic Atherosclerosis (ICD10-I70.0). Atherosclerotic disease at  both carotid bifurcations. Extensive primarily soft plaque at the bifurcation and ICA bulb on the right. ICA lumen 1 mm or less, indicating greater than 80% stenosis. Atherosclerotic plaque at the left carotid bifurcation with maximal stenosis in the distal bulb measuring 50%. Bilateral vertebral artery origin stenoses, 50% on the right and 30-50% on the left. Electronically Signed   By: Paulina Fusi M.D.   On: 05/27/2021 13:11   MR BRAIN WO CONTRAST  Result Date: 06/25/2021 CLINICAL DATA:  Leg numbness and agitation EXAM: MRI HEAD WITHOUT CONTRAST TECHNIQUE: Multiplanar, multiecho pulse sequences of the brain and surrounding structures were obtained without intravenous contrast. COMPARISON:  05/27/2021 FINDINGS: Brain: Recent right perirolandic infarcts as previously demonstrated, with fading. Two new acute infarcts are seen in the high posterior right frontal cortex. New subcortical parasagittal right frontal tiny infarct which is in the ACA territory. No acute hemorrhage, hydrocephalus, or masslike finding. Normal brain volume and white matter appearance for age. Vascular: Preserved flow voids Skull and upper cervical spine: Normal marrow signal Sinuses/Orbits: Right cataract resection. Chronic right frontal sinusitis with opacification from peripheral mucosal thickening and central inspissated material. Other: Motion artifact IMPRESSION: Small acute to subacute infarcts in the high right frontal lobe (ACA and MCA distribution) since comparison 05/27/2021. There has been expected fading of other recent infarcts in this territory on prior. 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 Electronically Signed   By: Marnee Spring M.D.   On: 06/25/2021 05:48   MR BRAIN WO CONTRAST  Result Date:  05/27/2021 CLINICAL DATA:  Neuro deficit, acute, stroke suspected. Left arm weakness and numbness. EXAM: MRI HEAD WITHOUT CONTRAST TECHNIQUE: Multiplanar, multiecho  pulse sequences of the brain and surrounding structures were obtained without intravenous contrast. COMPARISON:  Head CT and CTA 05/27/2021 FINDINGS: Brain: There are small acute cortical and subcortical infarcts involving the right frontal and parietal lobes in the perirolandic region. A punctate focus of susceptibility artifact in the right frontoparietal region corresponds to a calcification on CT. No intracranial hemorrhage, mass, midline shift, or extra-axial fluid collection is identified. T2 hyperintensities elsewhere in the cerebral white matter bilaterally are nonspecific but compatible with mild chronic small vessel ischemic disease. There are chronic lacunar infarcts in the right corona radiata and caudate body. There is mild-to-moderate cerebral atrophy. Vascular: Major intracranial vascular flow voids are preserved. Skull and upper cervical spine: Unremarkable bone marrow signal. Sinuses/Orbits: Right cataract extraction. Complete opacification of the right frontal sinus. No significant mastoid fluid. Other: None. IMPRESSION: 1. 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 2. Mild chronic small vessel ischemic disease and cerebral atrophy. Electronically Signed   By: Sebastian Ache M.D.   On: 05/27/2021 18:51   ECHOCARDIOGRAM COMPLETE  Result Date: 05/27/2021    ECHOCARDIOGRAM REPORT   Patient Name:   Tyler Burns Date of Exam: 05/27/2021 Medical Rec #:  [id number]     Height:       72.0 in Accession #:    [id number]    Weight:       236.0 lb Date of Birth:  01/05/1956     BSA:          2.286 m Patient Age:    65 years      BP:           180/121 mmHg Patient Gender: M             HR:           67 bpm. Exam Location:  Inpatient Procedure: 2D Echo, Cardiac Doppler, Color Doppler and Intracardiac            Opacification Agent Indications:    Stroke  History:        Patient has prior history of Echocardiogram examinations, most                 recent 10/10/2013. Arrythmias:Atrial Fibrillation; Risk                  Factors:Former Smoker. CKD. AAA.  Sonographer:    Ross Ludwig RDCS (AE) Referring Phys: [id number] [facility] PFEIFFER IMPRESSIONS  1. Left ventricular ejection fraction, by estimation, is 60 to 65%. The left ventricle has normal function. The left ventricle has no regional wall motion abnormalities. There is mild left ventricular hypertrophy. Left ventricular diastolic parameters are indeterminate.  2. Right ventricular systolic function is normal. The right ventricular size is normal. Tricuspid regurgitation signal is inadequate for assessing PA pressure.  3. Left atrial size was mildly dilated.  4. The mitral valve is normal in structure. No evidence of mitral valve regurgitation. No evidence of mitral stenosis.  5. The aortic valve is tricuspid. Aortic valve regurgitation is not visualized. No aortic stenosis is present.  6. The inferior vena cava is dilated in size with >50% respiratory variability, suggesting right atrial pressure of 8 mmHg. FINDINGS  Left Ventricle: Left ventricular ejection fraction, by estimation, is 60 to 65%. The left ventricle has normal function. The left ventricle has no regional wall motion abnormalities. Definity contrast agent  was given IV to delineate the left ventricular  endocardial borders. The left ventricular internal cavity size was normal in size. There is mild left ventricular hypertrophy. Left ventricular diastolic parameters are indeterminate. Right Ventricle: The right ventricular size is normal. No increase in right ventricular wall thickness. Right ventricular systolic function is normal. Tricuspid regurgitation signal is inadequate for assessing PA pressure. Left Atrium: Left atrial size was mildly dilated. Right Atrium: Right atrial size was normal in size. Pericardium: There is no evidence of pericardial effusion. Mitral Valve: The mitral valve is normal in structure. No evidence of mitral valve regurgitation. No evidence of mitral valve stenosis. MV peak  gradient, 4.0 mmHg. The mean mitral valve gradient is 1.0 mmHg. Tricuspid Valve: The tricuspid valve is normal in structure. Tricuspid valve regurgitation is not demonstrated. Aortic Valve: The aortic valve is tricuspid. Aortic valve regurgitation is not visualized. No aortic stenosis is present. Aortic valve mean gradient measures 2.0 mmHg. Aortic valve peak gradient measures 3.4 mmHg. Aortic valve area, by VTI measures 4.20 cm. Pulmonic Valve: The pulmonic valve was not well visualized. Pulmonic valve regurgitation is not visualized. Aorta: The aortic root and ascending aorta are structurally normal, with no evidence of dilitation. Venous: The inferior vena cava is dilated in size with greater than 50% respiratory variability, suggesting right atrial pressure of 8 mmHg. IAS/Shunts: The interatrial septum was not well visualized.  LEFT VENTRICLE PLAX 2D LVIDd:         4.90 cm LVIDs:         3.30 cm LV PW:         1.10 cm LV IVS:        1.30 cm LVOT diam:     2.40 cm LV SV:         71 LV SV Index:   31 LVOT Area:     4.52 cm  RIGHT VENTRICLE             IVC RV Basal diam:  3.90 cm     IVC diam: 2.20 cm RV S prime:     14.30 cm/s TAPSE (M-mode): 2.3 cm LEFT ATRIUM             Index       RIGHT ATRIUM           Index LA diam:        4.70 cm 2.06 cm/m  RA Area:     27.80 cm LA Vol (A2C):   89.9 ml 39.33 ml/m RA Volume:   91.50 ml  40.03 ml/m LA Vol (A4C):   69.8 ml 30.54 ml/m LA Biplane Vol: 83.0 ml 36.31 ml/m  AORTIC VALVE AV Area (Vmax):    4.18 cm AV Area (Vmean):   3.77 cm AV Area (VTI):     4.20 cm AV Vmax:           91.80 cm/s AV Vmean:          62.200 cm/s AV VTI:            0.170 m AV Peak Grad:      3.4 mmHg AV Mean Grad:      2.0 mmHg LVOT Vmax:         84.90 cm/s LVOT Vmean:        51.900 cm/s LVOT VTI:          0.158 m LVOT/AV VTI ratio: 0.93  AORTA Ao Root diam: 3.90 cm Ao Asc diam:  3.50 cm MITRAL VALVE MV Area (  PHT): 4.07 cm  SHUNTS MV Area VTI:   2.91 cm  Systemic VTI:  0.16 m MV Peak grad:   4.0 mmHg  Systemic Diam: 2.40 cm MV Mean grad:  1.0 mmHg MV Vmax:       1.00 m/s MV Vmean:      54.9 cm/s Epifanio Lesches MD Electronically signed by Epifanio Lesches MD Signature Date/Time: 05/27/2021/5:55:47 PM    Final    VAS US CAROTID  Result Date: 05/29/2021 Carotid Arterial Duplex Study Patient Name:  Tyler Burns  Date of Exam:   05/28/2021 Medical Rec #: [id number]      Accession #:    [id number] Date of Birth: 08-02-1956      Patient Gender: M Patient Age:   65Y Exam Location:  [facility] Procedure:      VAS US CAROTID Referring Phys: [id number] COURTNEY S HEARD --------------------------------------------------------------------------------  Indications:       Stenosis. Risk Factors:      Hypertension, hyperlipidemia. Comparison Study:  no prior Performing Technologist: [place] Ponder RVS  Examination Guidelines: A complete evaluation includes B-mode imaging, spectral Doppler, color Doppler, and power Doppler as needed of all accessible portions of each vessel. Bilateral testing is considered an integral part of a complete examination. Limited examinations for reoccurring indications may be performed as noted.  Right Carotid Findings: +---------+--------+-------+--------+---------------------------------+--------+          PSV cm/sEDV    StenosisPlaque Description               Comments                  cm/s                                                     +---------+--------+-------+--------+---------------------------------+--------+ CCA Prox 61      15             heterogenous                              +---------+--------+-------+--------+---------------------------------+--------+ CCA      91      20             heterogenous                              Distal                                                                    +---------+--------+-------+--------+---------------------------------+--------+ ICA Prox 202     57     60-79%   heterogenous, calcific and                                                irregular                                 +---------+--------+-------+--------+---------------------------------+--------+  ICA Mid  127     28                                                       +---------+--------+-------+--------+---------------------------------+--------+ ICA      64      10                                                       Distal                                                                    +---------+--------+-------+--------+---------------------------------+--------+ ECA      118     12                                                       +---------+--------+-------+--------+---------------------------------+--------+ +----------+--------+-------+--------+-------------------+           PSV cm/sEDV cmsDescribeArm Pressure (mmHG) +----------+--------+-------+--------+-------------------+ [id number]                                         +----------+--------+-------+--------+-------------------+ +---------+--------+--+--------+--+---------+ VertebralPSV cm/s33EDV cm/s11Antegrade +---------+--------+--+--------+--+---------+  Left Carotid Findings: +----------+--------+--------+--------+------------------+--------+           PSV cm/sEDV cm/sStenosisPlaque DescriptionComments +----------+--------+--------+--------+------------------+--------+ CCA Prox  67      21              heterogenous               +----------+--------+--------+--------+------------------+--------+ CCA Distal64      23              heterogenous               +----------+--------+--------+--------+------------------+--------+ ICA Prox  86      23      1-39%   heterogenous               +----------+--------+--------+--------+------------------+--------+ ICA Distal70      24                                          +----------+--------+--------+--------+------------------+--------+ ECA       213     45                                         +----------+--------+--------+--------+------------------+--------+ +----------+--------+--------+--------+-------------------+           PSV cm/sEDV cm/sDescribeArm Pressure (mmHG) +----------+--------+--------+--------+-------------------+ [id number]                                          +----------+--------+--------+--------+-------------------+ +---------+--------+--+--------+--+---------+  VertebralPSV cm/s37EDV cm/s13Antegrade +---------+--------+--+--------+--+---------+   Summary: Right Carotid: Velocities in the right ICA are consistent with a 60-79%                stenosis. Left Carotid: Velocities in the left ICA are consistent with a 1-39% stenosis. Vertebrals: Bilateral vertebral arteries demonstrate antegrade flow. *See table(s) above for measurements and observations.  Electronically signed by Delia Heady MD on 05/29/2021 at 1:06:08 PM.    Final    Structural Heart Procedure  Result Date: 06/05/2021 See surgical note for result.  HYBRID OR IMAGING ([facility] ONLY)  Result Date: 06/05/2021 There is no interpretation for this exam.  This order is for images obtained during a surgical procedure.  Please See "Surgeries" Tab for more information regarding the procedure.     Radiology MR BRAIN WO CONTRAST  Result Date: 06/25/2021 CLINICAL DATA:  Leg numbness and agitation EXAM: MRI HEAD WITHOUT CONTRAST TECHNIQUE: Multiplanar, multiecho pulse sequences of the brain and surrounding structures were obtained without intravenous contrast. COMPARISON:  05/27/2021 FINDINGS: Brain: Recent right perirolandic infarcts as previously demonstrated, with fading. Two new acute infarcts are seen in the high posterior right frontal cortex. New subcortical parasagittal right frontal tiny infarct which is in the ACA territory. No acute hemorrhage, hydrocephalus,  or masslike finding. Normal brain volume and white matter appearance for age. Vascular: Preserved flow voids Skull and upper cervical spine: Normal marrow signal Sinuses/Orbits: Right cataract resection. Chronic right frontal sinusitis with opacification from peripheral mucosal thickening and central inspissated material. Other: Motion artifact IMPRESSION: Small acute to subacute infarcts in the high right frontal lobe (ACA and MCA distribution) since comparison 05/27/2021. There has been expected fading of other recent infarcts in this territory on prior. 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 Electronically Signed   By: Marnee Spring M.D.   On: 06/25/2021 05:48    Procedures Procedures   Medications Ordered in ED Medications  LORazepam (ATIVAN) tablet 1 mg (1 mg Oral Given 06/25/21 0432)    ED Course  I have reviewed the triage vital signs and the nursing notes.  Pertinent labs & imaging results that were available during my care of the patient were reviewed by me and considered in my medical decision making (see chart for details).   Seen by Dr. Derry Lory, please admit to medicine     Final Clinical Impression(s) / ED Diagnoses Final diagnoses:  None   Admit to medicine  Rx / DC Orders ED Discharge Orders     None        Towanda Hornstein, MD 06/25/21 [phone]

## 2021-06-25 NOTE — H&P (Signed)
Date: 06/25/2021               Patient Name:  Tyler Burns MRN: [id number]  DOB: 14-Oct-1956 Age / Sex: 65 y.o., male   PCP: Quitman LivingsHassan, [name], MD         Medical Service: Internal Medicine Teaching Service         Attending Physician: Dr. Inez CatalinaMullen, Emily B, MD    First Contact: Carmel SacramentoMonik Patel, MD Pager: MP [phone]  Second Contact: Merrilyn PumaSagar Jinwala, MD Pager: Nada MaclachlanSJ [phone]       After Hours (After 5p/  First Contact Pager: [phone]  weekends / holidays): Second Contact Pager: [phone]   SUBJECTIVE  Chief Complaint: left leg weakness  History of Present Illness: Tyler Burns is a 65 y.o. 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 Patient reports that he was in his usual state of health until yesterday afternoon when he had sudden onset of left leg weakness. This lasted for about 15 minutes before subsiding. He notes that this weakness is no longer there and he feels back to baseline at this time. He denies any headaches, vision changes, dizziness/lightheadedness, chest pain, shortness of breath or nausea/vomiting. He does endorse having some palpitations during the episode and intermittent episodes of shortness of breath since his TCAR for right carotid stenosis on 06/05/21.  Of note, patient was recently admitted on 8/1-8/2 for acute CVA of right frontal and parietal infarcts. He was noted to have >80% stenosis of the right carotid artery and was recommended for vascular intervention. He was discharged on aspirin and plavix. He was recommended to start anticoagulation following three weeks of dual antiplatelet therapy. He underwent right transcarotid artery revascularization on 06/05/21 and has been adherent to his aspirin and plavix therapy since discharge. However, has not started anticoagulation  yet.     Patient presented with transient left lower extremity weakness. VSS. Code stroke activated. Repeat MRI Brain with acute to subacute infarcts in high right frontal lobe, suspected to be recurrent embolic disease.  Patient admitted for further stroke management.   Medications: No current facility-administered medications on file prior to encounter.   Current Outpatient Medications on File Prior to Encounter  Medication Sig Dispense Refill   aspirin EC 81 MG tablet Take 1 tablet (81 mg total) by mouth daily. 90 tablet 3   atorvastatin (LIPITOR) 80 MG tablet Take 1 tablet (80 mg total) by mouth daily with supper. 30 tablet 0   bimatoprost (LUMIGAN) 0.01 % SOLN Place 1 drop into both eyes at bedtime.     brinzolamide (AZOPT) 1 % ophthalmic suspension Place 1 drop into both eyes 2 (two) times a day.     cetirizine (ZYRTEC) 10 MG tablet Take 10 mg by mouth daily.     clopidogrel (PLAVIX) 75 MG tablet Take 1 tablet (75 mg total) by mouth daily. 30 tablet 1   metoprolol succinate (TOPROL-XL) 50 MG 24 hr tablet Take 50 mg by mouth daily. Take with or immediately following a meal.     Omega-3 Fatty Acids (FISH OIL) 600 MG CAPS Take 600 mg by mouth daily.      vitamin E 400 UNIT capsule Take 400 Units by mouth daily.       Past Medical History:  Past Medical History:  Diagnosis Date  Asthma    Dysrhythmia    Afib   GERD (gastroesophageal reflux disease)    Glaucoma    High cholesterol    History of cardiovascular stress test    Myoview 12/19:  EF 50%, No ischemia, Low Risk   Hypertension    Stroke [facility])    August 2022    Social:  Patient lives at home with his wife in [place]. He retired following back injury approximately 12 years ago. He endorses occasional alcohol use. He denies any history of tobacco or illicit substance use.   Family History: Family History  Problem Relation Age of Onset   Congestive Heart Failure Mother    Heart attack Mother 86   Heart attack  Brother 56   Leukemia Brother    Heart failure Sister 70   Stroke Neg Hx     Allergies: Allergies as of 06/24/2021 - Review Complete 06/24/2021  Allergen Reaction Noted   Brinzolamide-brimonidine Shortness Of Breath 05/02/2015    Review of Systems: A complete ROS was negative except as per HPI.   OBJECTIVE:  Physical Exam: Blood pressure (!) 185/119, pulse (!) 112, temperature 98.4 F (36.9 C), temperature source Oral, resp. rate (!) 23, SpO2 96 %. Physical Exam  Constitutional: Somnolent but easily arousable; Appears well-developed and well-nourished. No distress.  HENT: Normocephalic and atraumatic, EOMI, moist mucous membranes, poor dentition Cardiovascular: irregularly irregular, S1 and S2 present, no murmurs, rubs, gallops.  Distal pulses intact Respiratory: No respiratory distress, no accessory muscle use. On Room air Lungs are clear to auscultation bilaterally. GI: Nondistended, soft, nontender to palpation, normal bowel sounds Musculoskeletal: Normal bulk and tone.  No peripheral edema noted. Neurological: Is alert and oriented x3, No dysarthria or facial asymmetry noted; CN II-XII grossly intact; strength 5/5 in bilateral upper and lower extremities; sensation to light touch intact Skin: Warm and dry.  No rash, erythema, lesions noted. Psychiatric: Normal mood and affect.   Pertinent Labs: CBC    Component Value Date/Time   WBC 7.5 06/24/2021 1656   RBC 4.82 06/24/2021 1656   HGB 14.8 06/24/2021 1656   HCT 45.0 06/24/2021 1656   PLT 354 06/24/2021 1656   MCV 93.4 06/24/2021 1656   MCH 30.7 06/24/2021 1656   MCHC 32.9 06/24/2021 1656   RDW 13.1 06/24/2021 1656   LYMPHSABS 1.4 06/24/2021 1656   MONOABS 0.6 06/24/2021 1656   EOSABS 0.2 06/24/2021 1656   BASOSABS 0.1 06/24/2021 1656     CMP     Component Value Date/Time   NA 137 06/24/2021 1656   NA 140 07/30/2017 1212   K 4.3 06/24/2021 1656   CL 103 06/24/2021 1656   CO2 27 06/24/2021 1656   GLUCOSE 104  (H) 06/24/2021 1656   BUN 16 06/24/2021 1656   BUN 18 07/30/2017 1212   CREATININE 1.03 06/24/2021 1656   CALCIUM 9.2 06/24/2021 1656   PROT 6.5 05/27/2021 0739   PROT 7.1 07/30/2017 1212   ALBUMIN 3.5 05/27/2021 0739   ALBUMIN 4.7 07/30/2017 1212   AST 18 05/27/2021 0739   ALT 15 05/27/2021 0739   ALKPHOS 57 05/27/2021 0739   BILITOT 0.5 05/27/2021 0739   BILITOT 0.8 07/30/2017 1212   GFRNONAA >60 06/24/2021 1656   GFRAA >60 05/26/2019 0447    Pertinent Imaging: MR ANGIO HEAD WO CONTRAST  Result Date: 06/25/2021 CLINICAL DATA:  Neuro deficit acute stroke suspected EXAM: MRA HEAD WITHOUT CONTRAST TECHNIQUE: Angiographic images of the Circle of Willis were acquired using MRA technique without  intravenous contrast. COMPARISON:  CTA of the head and neck from 05/27/2021 FINDINGS: Anterior circulation: Carotid arteries, major vessels are diffusely patent. No major branch occlusion, beading, or aneurysm is seen. Generalized vessel irregularity from motion artifact. Posterior circulation: The vertebral and basilar arteries are smoothly contoured and diffusely patent. Fetal type left PCA. No branch occlusion, beading, or aneurysm. Anatomic variants: As above IMPRESSION: No emergent finding or flow-limiting stenosis. Stable compared to CTA earlier this month. Electronically Signed   By: Marnee Spring M.D.   On: 06/25/2021 07:03   MR BRAIN WO CONTRAST  Result Date: 06/25/2021 CLINICAL DATA:  Leg numbness and agitation EXAM: MRI HEAD WITHOUT CONTRAST TECHNIQUE: Multiplanar, multiecho pulse sequences of the brain and surrounding structures were obtained without intravenous contrast. COMPARISON:  05/27/2021 FINDINGS: Brain: Recent right perirolandic infarcts as previously demonstrated, with fading. Two new acute infarcts are seen in the high posterior right frontal cortex. New subcortical parasagittal right frontal tiny infarct which is in the ACA territory. No acute hemorrhage, hydrocephalus, or  masslike finding. Normal brain volume and white matter appearance for age. Vascular: Preserved flow voids Skull and upper cervical spine: Normal marrow signal Sinuses/Orbits: Right cataract resection. Chronic right frontal sinusitis with opacification from peripheral mucosal thickening and central inspissated material. Other: Motion artifact IMPRESSION: Small acute to subacute infarcts in the high right frontal lobe (ACA and MCA distribution) since comparison 05/27/2021. There has been expected fading of other recent infarcts in this territory on prior. 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 Electronically Signed   By: Marnee Spring M.D.   On: 06/25/2021 05:48   VAS US CAROTID  Result Date: 06/25/2021 Carotid Arterial Duplex Study Patient Name:  Tyler Burns  Date of Exam:   06/25/2021 Medical Rec #: [id number]      Accession #:    [id number] Date of Birth: [birth year]      Patient Gender: M Patient Age:   52 years Exam Location:  [facility] Procedure:      VAS US CAROTID Referring Phys: Debe Coder --------------------------------------------------------------------------------  Indications:       CVA and Right stent. Risk Factors:      Hypertension, prior CVA. Comparison Study:  05/28/21 prior Performing Technologist: [place] Ponder RVS  Examination Guidelines: A complete evaluation includes B-mode imaging, spectral Doppler, color Doppler, and power Doppler as needed of all accessible portions of each vessel. Bilateral testing is considered an integral part of a complete examination. Limited examinations for reoccurring indications may be performed as noted.  Right Carotid Findings: +----------+--------+--------+--------+------------------+--------+           PSV cm/sEDV cm/sStenosisPlaque DescriptionComments +----------+--------+--------+--------+------------------+--------+ CCA Prox  70      17              heterogenous                +----------+--------+--------+--------+------------------+--------+ CCA Distal78      20              heterogenous               +----------+--------+--------+--------+------------------+--------+ ECA       79                                                 +----------+--------+--------+--------+------------------+--------+ +----------+--------+-------+--------+-------------------+  PSV cm/sEDV cmsDescribeArm Pressure (mmHG) +----------+--------+-------+--------+-------------------+ [id number]                                         +----------+--------+-------+--------+-------------------+ +---------+--------+--+--------+--+---------+ VertebralPSV cm/s34EDV cm/s11Antegrade +---------+--------+--+--------+--+---------+  Right Stent(s): +---------------+--+--+-------------+++ Prox to Stent  4511              +---------------+--+--+-------------+++ Proximal Stent 5917              +---------------+--+--+-------------+++ Mid Stent      6529<50% stenosis +---------------+--+--+-------------+++ Distal Stent   6522              +---------------+--+--+-------------+++ Distal to [id number]              +---------------+--+--+-------------+++   Left Carotid Findings: +----------+--------+--------+--------+------------------+--------+           PSV cm/sEDV cm/sStenosisPlaque DescriptionComments +----------+--------+--------+--------+------------------+--------+ CCA Prox  50      12              heterogenous               +----------+--------+--------+--------+------------------+--------+ CCA Distal81      21              heterogenous               +----------+--------+--------+--------+------------------+--------+ ICA Prox  83      27      1-39%   heterogenous               +----------+--------+--------+--------+------------------+--------+ ICA Distal54      15                                          +----------+--------+--------+--------+------------------+--------+ ECA       117     10                                         +----------+--------+--------+--------+------------------+--------+ +----------+--------+--------+--------+-------------------+           PSV cm/sEDV cm/sDescribeArm Pressure (mmHG) +----------+--------+--------+--------+-------------------+ [id number]                                          +----------+--------+--------+--------+-------------------+ +---------+--------+--+--------+-+---------+ VertebralPSV cm/s32EDV cm/s8Antegrade +---------+--------+--+--------+-+---------+   Summary: Right Carotid: ICA stent <50% stenosis. Left Carotid: Velocities in the left ICA are consistent with a 1-39% stenosis. Vertebrals: Bilateral vertebral arteries demonstrate antegrade flow. *See table(s) above for measurements and observations.     Preliminary     EKG: personally reviewed my interpretation is pending  ASSESSMENT & PLAN:  Assessment: Active Problems:   Acute CVA (cerebrovascular accident) (HCC)   Tyler Burns is a 65 y.o. 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 MRI Brain with acute and subacute infarcts in the right frontal lobe in the ACA and MCA  distribution. MRA Head without flow limiting stenosis noted. Of note, patient had similar CVA earlier this month and was found to have significant right carotid stenosis. He is s/p stent. Repeat carotid US with <50% stenosis of R carotid. He has been on dual antiplatelet therapy with aspirin and plavix since discharge. Most recent A1c 6.3  and lipid panel with total cholesterol 108 and LDL 69 earlier this month. Etiology of patient's recurrent CVA's is likely persistent atrial fibrillation as he is not currently on anticoagulation.  - Neurology following, appreciate recommendations  - Continue aspirin  daily and plavix  daily for 3 weeks  - Consider starting on anticoagulation  - Continue atorvastatin  daily  - Permissive HTN with goal <220/110 for first 24hrs - Will hold off on repeating Echo, A1c and lipid panel as these were performed <1 month prior - PT/OT eval   #Permanent atrial fibrillation CHA2DS2-VASc score 4. Patient has a history of permanent atrial fibrillation for which he is not on anticoagulation at this time. Patient was recommended for starting Eliquis after completing three weeks of dual antiplatelet therapy but has not been able to do so. Patient endorsed intermittent palpitations and shortness of breath. He was recommended for follow up with cardiology; however, has not been able to do so. He is on metoprolol  daily for rate control. HR in 60's at this time and he is asymptomatic. - Holding metoprolol at this time in setting of permissive HTN for acute CVA as above - Consider starting on anticoagulation on discharge  - Follow up with cardiology at discharge - Cardiac monitoring   #Hypertension Holding metoprolol for permissive HTN  #Hyperlipidemia  Continue atorvastatin  daily   #R carotid stenosis s/p TCAR  Patient was noted to have significant right carotid stenosis during prior hospitalization for stroke. He was referred to outpatient vascular surgery and had right transcarotid artery revascularization. Carotid US today with <50% stenosis of R carotid stent. - Continue aspirin and plavix as above.    Best Practice: Diet: Cardiac diet IVF: Fluids: None, Rate: None VTE: enoxaparin (LOVENOX) injection 40 mg Start: 06/25/21 1000 Code: Full Status: Observation with expected length  of stay less than 2 midnights. Anticipated Discharge Location: Home Barriers to Discharge: Medical stability  Signature: Eliezer Bottom, MD Internal Medicine Resident, PGY-3 [facility]  Pager: [phone] 9:50 AM, 06/25/2021   Please contact the on call pager after 5 pm and on weekends at [phone].

## 2021-06-25 NOTE — Progress Notes (Signed)
PT Cancellation Note  Patient Details Name: Tyler Burns MRN: [id number] DOB: January 30, 1956   Cancelled Treatment:    Reason Eval/Treat Not Completed: Patient at procedure or test/unavailable. Pt at MRI. Will check back as schedule allows.   Johnn Hai, SPT Johnn Hai 06/25/2021, 12:07 PM

## 2021-06-25 NOTE — Telephone Encounter (Signed)
Patient currently in hospital.

## 2021-06-26 DIAGNOSIS — I639 Cerebral infarction, unspecified: Secondary | ICD-10-CM

## 2021-06-26 MED ORDER — APIXABAN 5 MG PO TABS: 5.0000 mg | ORAL_TABLET | Freq: Two times a day (BID) | ORAL | 0 refills | Status: AC

## 2021-06-26 MED ORDER — ACETAMINOPHEN 325 MG PO TABS: 650.0000 mg | ORAL_TABLET | ORAL | Status: DC | PRN

## 2021-06-26 MED ORDER — CLOPIDOGREL BISULFATE 75 MG PO TABS: 75.0000 mg | ORAL_TABLET | Freq: Every day | ORAL | 0 refills | Status: DC

## 2021-06-26 NOTE — Discharge Instructions (Addendum)
Tyler Burns it was a pleasure taking care of you. You were admitted because left leg weakness concerning for another stroke. You were admitted and monitored for any ongoing signs of neurological problems. Given that you have the history of A fib, it is important for you to start anticoagulation to prevent a stroke in the future. This anticoagulation is called Eliquis, and it is easy to manage since it does not require frequent blood monitoring.   Please STOP taking aspirin Please CONTINUE taking the plavix daily Please START taking the Eliquis (anticoagulation) for the A fib.   If you have any more concerns of a stroke, such as very high blood pressure, chest pressure, changes in vision, slurry speech, weakness, and changes in sensation.

## 2021-06-26 NOTE — TOC Transition Note (Signed)
Transition of Care [facility]) - CM/SW Discharge Note   Patient Details  Name: Tyler Burns MRN: [id number] Date of Birth: 1956-07-07  Transition of Care [facility]) CM/SW Contact:  Kermit Balo, RN Phone Number: 06/26/2021, 1:48 PM   Clinical Narrative:    Pt states he lives with his girlfriend that is with him most of the time. He states he has all needed DME. Patient discharging home with self care. No f/u per PT/OT. CM updated pts daughter about supervision for meds and finances.  CM provided pt with 30 day free Eliquis card. Pt to f/u with his pharmacy on the cost after the 30 days.  Pt has transportation home.   Final next level of care: Home/Self Care Barriers to Discharge: No Barriers Identified   Patient Goals and CMS Choice        Discharge Placement                       Discharge Plan and Services                                     Social Determinants of Health (SDOH) Interventions     Readmission Risk Interventions No flowsheet data found.

## 2021-06-26 NOTE — Progress Notes (Signed)
Cardiology Office Note   Date:  06/27/2021   ID:  Tyler, Burns 1956/03/12, MRN [id number]  PCP:  Quitman Livings, MD    No chief complaint on file.  Abnormal stress test  Wt Readings from Last 3 Encounters:  06/27/21 227 lb 12.8 oz (103.3 kg)  06/05/21 235 lb (106.6 kg)  06/04/21 238 lb (108 kg)       History of Present Illness: Tyler Burns is a 65 y.o. male  with a hx of chronic atrial fibrillation, CKD, mildly depressed LV function.  CHADS2-VASc=1 (sCHF).  He has been treated with ASA only.     He had a R inguinal hernia.  He had surgical open repair with Dr. Avel Peace in 8/16. No cardiac issues with surgery. He had a low risk stress test, that showed a small area of ischemia.  This was medically managed.  No chest pain.   His activity is significantly limited by his chronic back pain.  He has a remote hx of smoking.  He is treated for HL.  He has a FHx of CAD (brother, mother with prior MI; sister died with CHF).  He declined anticoagulation in 2021 for his AFib.  On 06/05/2021 Admitted with a stroke in 05/2021-subacute stroke on MRI are in the setting or TCAR : "Patient was recommended for starting Eliquis after completing three weeks of dual antiplatelet therapy"  Leg strength has returned.  Feels well, except for some DOE.  He has been tired of being in the hospital.    Past Medical History:  Diagnosis Date   Asthma    Dysrhythmia    Afib   GERD (gastroesophageal reflux disease)    Glaucoma    High cholesterol    History of cardiovascular stress test    Myoview 12/19:  EF 50%, No ischemia, Low Risk   Hypertension    Stroke [facility])    August 2022    Past Surgical History:  Procedure Laterality Date   ABDOMINAL AORTIC ANEURYSM REPAIR N/A 05/25/2019   Procedure: ANEURYSM ABDOMINAL AORTIC REPAIR using a HEMASHIELD GOLD VASCULAR GRAFT;  Surgeon: Larina Earthly, MD;  Location: [facility] OR;  Service: Vascular;  Laterality: N/A;   APPENDECTOMY     EYE SURGERY  Right    cataract   INGUINAL HERNIA REPAIR Right 06/15/2015   Procedure: RIGHT INGUINAL HERNIA REPAIR WITH MESH;  Surgeon: Avel Peace, MD;  Location: [facility] OR;  Service: General;  Laterality: Right;   INSERTION OF MESH Right 06/15/2015   Procedure: INSERTION OF MESH;  Surgeon: Avel Peace, MD;  Location: [facility] OR;  Service: General;  Laterality: Right;   TRANSCAROTID ARTERY REVASCULARIZATION  Right 06/05/2021   Procedure: RIGHT TRANSCAROTID ARTERY REVASCULARIZATION;  Surgeon: Cephus Shelling, MD;  Location: [facility] OR;  Service: Vascular;  Laterality: Right;   VASCULAR SURGERY       Current Outpatient Medications  Medication Sig Dispense Refill   acetaminophen (TYLENOL) 325 MG tablet Take 2 tablets (650 mg total) by mouth every 4 (four) hours as needed for mild pain (or temp > 37.5 C (99.5 F)). 90 tablet    apixaban (ELIQUIS) 5 MG TABS tablet Take 1 tablet (5 mg total) by mouth 2 (two) times daily. 60 tablet 0   atorvastatin (LIPITOR) 80 MG tablet Take 1 tablet (80 mg total) by mouth daily with supper. 30 tablet 0   bimatoprost (LUMIGAN) 0.01 % SOLN Place 1 drop into both eyes at bedtime.     brinzolamide (  AZOPT) 1 % ophthalmic suspension Place 1 drop into both eyes 2 (two) times a day.     cetirizine (ZYRTEC) 10 MG tablet Take 10 mg by mouth daily.     clopidogrel (PLAVIX) 75 MG tablet Take 1 tablet (75 mg total) by mouth daily. 90 tablet 0   metoprolol succinate (TOPROL-XL) 50 MG 24 hr tablet Take 50 mg by mouth daily. Take with or immediately following a meal.     Omega-3 Fatty Acids (FISH OIL) 600 MG CAPS Take 600 mg by mouth daily.      vitamin E 400 UNIT capsule Take 400 Units by mouth daily.      No current facility-administered medications for this visit.    Allergies:   Brinzolamide-brimonidine    Social History:  The patient  reports that he quit smoking about 52 years ago. His smoking use included cigarettes. He has never used smokeless tobacco. He reports that he does  not drink alcohol and does not use drugs.   Family History:  The patient's family history includes Congestive Heart Failure in his mother; Heart attack (age of onset: 17) in his brother; Heart attack (age of onset: 44) in his mother; Heart failure (age of onset: 29) in his sister; Leukemia in his brother.    ROS:  Please see the history of present illness.   Otherwise, review of systems are positive for fatigue since recent hospital stays.   All other systems are reviewed and negative.    PHYSICAL EXAM: VS:  BP 120/70   Pulse 89   Ht 6' (1.829 m)   Wt 227 lb 12.8 oz (103.3 kg)   SpO2 95%   BMI 30.90 kg/m  , BMI Body mass index is 30.9 kg/m. GEN: Well nourished, well developed, in no acute distress HEENT: normal Neck: no JVD, carotid bruits, or masses Cardiac: irregularly irregular; no murmurs, rubs, or gallops,no edema , abrasion on left shin Respiratory:  clear to auscultation bilaterally, normal work of breathing GI: soft, nontender, nondistended, + BS, obese MS: no deformity or atrophy Skin: warm and dry, no rash Neuro:  Strength and sensation are intact Psych: euthymic mood, full affect   EKG:   The ekg ordered 06/25/21 demonstrates AFib, rate controlled   Recent Labs: 05/27/2021: ALT 15 06/24/2021: BUN 16; Creatinine, Ser 1.03; Hemoglobin 14.8; Platelets 354; Potassium 4.3; Sodium 137   Lipid Panel    Component Value Date/Time   CHOL 108 06/06/2021 0404   TRIG 80 06/06/2021 0404   HDL 23 (L) 06/06/2021 0404   CHOLHDL 4.7 06/06/2021 0404   VLDL 16 06/06/2021 0404   LDLCALC 69 06/06/2021 0404     Other studies Reviewed: Additional studies/ records that were reviewed today with results demonstrating: hospital records reviewed.   ASSESSMENT AND PLAN:  AFib: Was back in the hospital yesterday for possible CVA. Apixaban was started and aspirin was stopped.  Continuing Plavix. AAA: s/p repair.   HTN: The current medical regimen is effective;  continue present plan  and medications. Hyperlipidemia: Eat healthy.  Whole food, plant-based diet.  Increase fiber.  Avoid Processed foods. Increase exercise gradually, walking is sufficient.  This should help with his energy level.   Current medicines are reviewed at length with the patient today.  The patient concerns regarding his medicines were addressed.  The following changes have been made:  No change  Labs/ tests ordered today include:  No orders of the defined types were placed in this encounter.   Recommend 150 minutes/week  of aerobic exercise Low fat, low carb, high fiber diet recommended  Disposition:   FU in 6 months with CBC and BMet   Signed, Lance Muss, MD  06/27/2021 3:09 PM    [facility] [address] [place], [place], [place]  [address] Phone: [phone]; Fax: [phone]

## 2021-06-26 NOTE — Care Management Obs Status (Signed)
MEDICARE OBSERVATION STATUS NOTIFICATION   Patient Details  Name: Tyler Burns MRN: [id number] Date of Birth: Nov 02, 1955   Medicare Observation Status Notification Given:  Yes    Kermit Balo, RN 06/26/2021, 1:42 PM

## 2021-06-26 NOTE — Progress Notes (Signed)
Occupational Therapy Evaluation Patient Details Name: Tyler Burns MRN: [id number] DOB: 1956-08-01 Today's Date: 06/26/2021    History of Present Illness Pt is a 65yo male presenting to [facility] ED on 8/29 for LLE numbness and weakness. MRI brain: Small acute to subacute infarcts in R frontal lobe (ACA & MCA). 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   Clinical Impression   PTA pt lives independently with his significant other. Pt scored a 10 (0-4, normal; 5-9 impaired; above 10 in need of further assessment) on the Short Blessed Test of Memory and Concentration, demonstrating difficulty with attention and poor awareness/insight of deficits. Pt unable to accurately say numbers in reverse order from 20 to 1 or state months of the year in reverse order. 4/5 on delayed recall. Pt with multiple errors on cancellation tasks.  Recommend direct S with medication and financial management and to refrain from driving until cleared by MD. Daughter present for session. Pt does not feel that he has any "trouble with his thinking".  Follow Up Recommendations   (S with medication and financial management; refrain from driving until cleared by MD)    Equipment Recommendations       Recommendations for Other Services       Precautions / Restrictions Precautions Precautions: None      Mobility Bed Mobility Overal bed mobility: Modified Independent                  Transfers Overall transfer level: Modified independent                    Balance Overall balance assessment: Modified Independent                                         ADL either performed or assessed with clinical judgement   ADL Overall ADL's : At baseline                                             Vision Baseline Vision/History: 1 Wears glasses (for driving) Vision Assessment?: No apparent visual  deficits     Perception Perception Comments: clock draw without error   Praxis      Pertinent Vitals/Pain Pain Assessment: No/denies pain     Hand Dominance Right   Extremity/Trunk Assessment Upper Extremity Assessment RUE Deficits / Details: Strength grossly 5/5 RUE Sensation: WNL RUE Coordination: WNL LUE Deficits / Details: Strength grossly 5/5 LUE Sensation: WNL LUE Coordination: WNL (mild difficulty with rapid alternating movements)   Lower Extremity Assessment RLE Deficits / Details: Strength grossly 5/5 RLE Sensation: WNL RLE Coordination: WNL LLE Deficits / Details: Strength grossly 5/5 LLE Sensation:  (Pt reports hypersensitivity, but it is not painful.) LLE Coordination: WNL   Cervical / Trunk Assessment Cervical / Trunk Assessment: Other exceptions Cervical / Trunk Exceptions: History of back injury and AAA repair.   Communication Communication Communication: No difficulties   Cognition Arousal/Alertness: Awake/alert Behavior During Therapy: WFL for tasks assessed/performed Overall Cognitive Status: Impaired/Different from baseline Area of Impairment: Attention;Safety/judgement;Awareness                   Current Attention Level: Selective     Safety/Judgement: Decreased  awareness of deficits Awareness: Emergent   General Comments: Scored a 10/29 on the Short Blessed Test, placing him in the Impaired category. Deficits with attention and decreased self awareness, insight into deficits.   General Comments  Pt has healing surgical incision at base of R neck from stent placement 05/27/2021. Marland Kitchen    Exercises     Shoulder Instructions      Home Living Family/patient expects to be discharged to:: Private residence Living Arrangements: Spouse/significant other (Girlfriend) Available Help at Discharge: Family;Available 24 hours/day (Girlfriend; daughter comes by PRN) Type of Home: House Home Access: Ramped entrance     Home Layout: One level      Bathroom Shower/Tub: Producer, television/film/video: Handicapped height Bathroom Accessibility: Yes How Accessible: Accessible via walker Home Equipment: Shower seat;Grab bars - tub/shower;Cane - single point;Crutches;Bedside commode;Toilet riser;Walker - 2 wheels          Prior Functioning/Environment Level of Independence: Independent        Comments: retired;disabled        OT Problem List: Decreased cognition;Decreased safety awareness;Obesity      OT Treatment/Interventions: Self-care/ADL training;Cognitive remediation/compensation;Therapeutic activities    OT Goals(Current goals can be found in the care plan section) Acute Rehab OT Goals Patient Stated Goal: to go home  OT Frequency:     Barriers to D/C:            Co-evaluation              AM-PAC OT "6 Clicks" Daily Activity     Outcome Measure Help from another person eating meals?: None Help from another person taking care of personal grooming?: None Help from another person toileting, which includes using toliet, bedpan, or urinal?: None Help from another person bathing (including washing, rinsing, drying)?: None Help from another person to put on and taking off regular upper body clothing?: None Help from another person to put on and taking off regular lower body clothing?: None 6 Click Score: 24   End of Session Nurse Communication: Other (comment) (DC needs)  Activity Tolerance: Patient tolerated treatment well Patient left: in bed;with call bell/phone within reach;with family/visitor present  OT Visit Diagnosis: Other symptoms and signs involving cognitive function                Time: 1145-1210 OT Time Calculation (min): 25 min Charges:  OT General Charges $OT Visit: 1 Visit OT Evaluation $OT Eval Moderate Complexity: 1 Mod  Tyler Burns, OT/L   Acute OT Clinical Specialist Acute Rehabilitation Services Pager [phone] Office [phone]   [facility] 06/26/2021, 12:20  PM

## 2021-06-26 NOTE — Progress Notes (Signed)
STROKE TEAM PROGRESS NOTE    Interval History   No acute events overnight. Patient is resting in bed with his wife  at bedside.  Patient states he is feeling better and wants to go home.  MRI scan lumbar spine yesterday shows multilevel degenerative changes and mild right-sided foraminal stenosis at L5-S1 but no definite root impingement or herniated disc is noted.  Pertinent Lab Work and Imaging    06/26/21 MRI Brain WO IV Contrast Small acute to subacute infarcts in the high right frontal lobe (ACA and MCA distribution) since comparison 05/27/2021. There has been expected fading of other recent infarcts in this territory on prior.This is likely recurrent embolic disease from the patient's severe right ICA stenosis in the neck.  06/25/21 MR Angio Head WO Contrast  No emergent finding or flow-limiting stenosis. Stable compared to CTA earlier this month.  06/25/21 CUS  Right Carotid: Velocities in the right ICA are consistent with a 60-79% stenosis.   Left Carotid: Velocities in the left ICA are consistent with a 1-39% stenosis.   Vertebrals: Bilateral vertebral arteries demonstrate antegrade flow.   Physical Examination   Constitutional: Calm, appropriate for condition  Cardiovascular: Normal RR Respiratory: No increased WOB   Mental status: AAOx4 Speech: Fluent repetition and naming intact  Cranial nerves: EOMI, VFF, Face symmetric, Tongue midline  Motor: Normal bulk and tone. No drift. Strength 5/5 throughout + straight leg test  Sensory: Sensation intact to light touch  Coordination: Intact FNF  Gait: Deferred     Assessment and Plan   Tyler Burns is a 65 y.o. male w/pmh of asthma, glaucoma, HLD, AAA s/p repair in 2020, Afibb on aspirin, HTN, recent R frontal and parietal stroke due to significant R ICA stenosis s/p TCAR who presents with an episode of left leg weakness that lasted for 15 minutes.   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 Of note, he is familiar to the stroke service- was admitted on 05/27/21 with left sided weakness and underwent full stroke work up. He was found to have acute right frontal and parietal stroke, cryptogenic etiology in the setting of atheroembolism given RICA stenosis versus atrial fibrillation. Recommended that he be evaluated by vascular surgery for intervention. He was seen by them on an outpatient basis and underwent TCAR on 06/05/21.  MRI Brain this admission reveals new stroke in comparison to prior MRI that appear subacute. MRA Head negative for significant stenosis and stable compared to 05/27/21 CTA Head and Neck. CUS also showed stable stenosis. LDL 69, hemoglobin a1c 6.3  Suspect that subacute stroke on MRI are in the setting or TCAR and are not contributing to his presenting sx of left sided leg weakness. More than likely his left leg movement was impaired due to sciatica, considering his sitting position on the bucket and back pain when he got up from the bucket.   Recommendations:  - MRI Lumbar spine to evaluate for herniated disc/degenerative disc disease  - Continue DAPT and Atorvastatin 80 for stroke risk reduction  - No need for permissive hypertension or echocardiogram this admission   From a neurology standpoint he is ok to discharge  Hospital day # 0   Patient presented with sudden onset of left leg weakness accompanying back pain and left hip pain which appears to have resolved.  Neurological exam at present is nonfocal.  MRI scan of the  brain shows tiny punctate weakly diffusion positive lesion in the right frontal lobe but it is not correspondingly dark on ADC map and likely represent subacute infarct in May infarct within the periprocedural from the TCAR procedure he had 3 weeks ago.  Carotid ultrasound repeated today shows patent right carotid stent without  significant restenosis.  MRI lumbar spine shows no significant compressive etiology.Molli Knock to start anticoagulation with NOAC for his atrial fibrillation but he will also need Plavix for his carotid stent and aggressive risk factor modification.  Recommend discuss antiplatelet regimen with vascular surgery prior to discharge.  Patient may be discharged later today.  Continue aggressive risk factor modification. Discussed with Dr. Criselda Peaches and Dr. Clotilde Dieter.  Greater than 50% time during the 25-minute visit were spent in counseling and coordination of care and discussion with care team.  Tyler Heady, MD Medical Director [facility] Pager: [phone] 06/26/2021 12:37 PM  To contact Stroke Continuity provider, please refer to [email or web address]. After hours, contact General Neurology

## 2021-06-26 NOTE — Progress Notes (Signed)
Discharge instructions given. Patient verbalized understanding and all questions were answered.  ?

## 2021-06-26 NOTE — Discharge Summary (Addendum)
Name: Tyler Burns MRN: [id number] DOB: [birth year] 65 y.o. PCP: Quitman Livings, MD  Date of Admission: 06/24/2021  4:53 PM Date of Discharge:  06/26/21 Attending Physician: Dr. Criselda Peaches  DISCHARGE DIAGNOSIS:  Primary Problem: Acute CVA   Hospital Problems: Active Problems:   Acute CVA (cerebrovascular accident) [facility])    DISCHARGE MEDICATIONS:   Allergies as of 06/26/2021       Reactions   Brinzolamide-brimonidine Shortness Of Breath   Feels he has difficulty breathing when on medication Eye drop medication        Medication List     STOP taking these medications    aspirin EC 81 MG tablet       TAKE these medications    acetaminophen 325 MG tablet Commonly known as: TYLENOL Take 2 tablets (650 mg total) by mouth every 4 (four) hours as needed for mild pain (or temp > 37.5 C (99.5 F)).   apixaban 5 MG Tabs tablet Commonly known as: Eliquis Take 1 tablet (5 mg total) by mouth 2 (two) times daily.   atorvastatin 80 MG tablet Commonly known as: LIPITOR Take 1 tablet (80 mg total) by mouth daily with supper.   bimatoprost 0.01 % Soln Commonly known as: LUMIGAN Place 1 drop into both eyes at bedtime.   brinzolamide 1 % ophthalmic suspension Commonly known as: AZOPT Place 1 drop into both eyes 2 (two) times a day.   cetirizine 10 MG tablet Commonly known as: ZYRTEC Take 10 mg by mouth daily.   clopidogrel 75 MG tablet Commonly known as: PLAVIX Take 1 tablet (75 mg total) by mouth daily. Start taking on: June 27, 2021   Fish Oil 600 MG Caps Take 600 mg by mouth daily.   metoprolol succinate 50 MG 24 hr tablet Commonly known as: TOPROL-XL Take 50 mg by mouth daily. Take with or immediately following a meal.   vitamin E 180 MG (400 UNITS) capsule Take 400 Units by mouth daily.        DISPOSITION AND FOLLOW-UP:  Tyler Burns was discharged from [facility] in Stable condition. At the hospital follow up visit please  address:  Follow-up Recommendations: Consults: none  Labs: none  Studies: none  Medications: Start taking eliquis and stop aspirin    Follow-up Appointments:  Follow-up Information     Quitman Livings, MD. Schedule an appointment as soon as possible for a visit in 1 week(s).   Specialty: Internal Medicine Why: Make appt for post-hospitalization visit with your PCP. Contact information: [address] [place] [address] [phone]         Corky Crafts, MD .   Specialties: Cardiology, Radiology, Interventional Cardiology Contact information: [address] [place] [address] [phone]         [facility]. Go on 07/09/2021.   Specialty: Vascular Surgery Why: At 0800 AM Contact information: [address] [place] [address] [phone]                HOSPITAL COURSE:  Patient Summary: Tyler Burns is a 65 y.o. with pertinent PMH of hypertension, atrial fibrillation no on anticoagulation, hyperlipidemia, AAA s/p repair in 2020, recent right frontal and parietal CVA 2/2 significant R ICA stenosis s/p TCAR who presented with transient left leg weakness and admit for acute CVA.    Subacute ACA MCA Stroke likely periprocedural and clinically silent Sciatica left leg with transient weakness which has resolved Patient  presented with transient left lower extremity weakness that has resolved on my exam. MRI Brain with acute and subacute infarcts in the right frontal lobe in the ACA and MCA distribution. MRA Head without flow limiting stenosis noted. Of note, patient had similar CVA earlier this month and was found to have significant right carotid stenosis. He is s/p stent. Repeat carotid US with <50% stenosis of R carotid. He has been on dual antiplatelet therapy with aspirin and plavix since discharge. Pt has been back to his baseline since admission. Suspect that subacute stroke on MRI  are in the setting or TCAR and are not contributing to his presenting sx of left sided leg weakness. More than likely his left leg movement was impaired due to sciatica, considering his sitting position on the bucket and back pain when he got up from the bucket. MRI spine negative. Most recent A1c 6.3 and lipid panel with total cholesterol 108 and LDL 69 earlier this month. A dietician was consulted as well. Etiology of patient's recurrent CVA's is likely persistent atrial fibrillation as he is not currently on anticoagulation. When planing for therapy at discharge, this was discussed with his vascular surgeon, who recommended stopping Asprin, continuing plavix, and starting anticoagulation, for which eliquis is prescribed at discharge.   Permanent atrial fibrillation CHA2DS2-VASc score 4. Patient has a history of permanent atrial fibrillation for which he is not on anticoagulation at this time. Patient was recommended for starting Eliquis after completing three weeks of dual antiplatelet therapy but has not been able to do so. Patient endorsed intermittent palpitations and SOB. He was recommended for follow up with cardiology; however, has not been able to do so. He is on metoprolol 50mg  daily for rate control. HR in 60's at this time and he is asymptomatic. Metoprolol was continued at discharge.    Hypertension Restart home metoprolol   Hyperlipidemia  Continue atorvastatin 80mg  daily    R carotid stenosis s/p TCAR  Patient was noted to have significant right carotid stenosis during prior hospitalization for stroke. He was referred to outpatient vascular surgery and had right transcarotid artery revascularization. Carotid US today with <50% stenosis of R carotid stent. - Continued aspirin and plavix as above, with discharge plans to start anticoagulation and stopping aspirin at discharge to prevent triple therapy.    DISCHARGE INSTRUCTIONS:   Tyler Burns it was a pleasure taking care of you. You  were admitted because left leg weakness concerning for another stroke. You were admitted and monitored for any ongoing signs of neurological problems. Given that you have the history of A fib, it is important for you to start anticoagulation to prevent a stroke in the future. This anticoagulation is called Eliquis, and it is easy to manage since it does not require frequent blood monitoring.   Please STOP taking aspirin Please CONTINUE taking the plavix daily Please START taking the Eliquis (anticoagulation) for the A fib.   If you have any more concerns of a stroke, such as very high blood pressure, chest pressure, changes in vision, slurry speech, weakness, and changes in sensation.  Discharge Instructions     Increase activity slowly   Complete by: As directed        SUBJECTIVE:  No acute overnight events. Patient was seen at bedside during rounds this morning. Pt reports feeling great this morning. Pt denies changes in vision, confusion, CP, and SOB. No other complains or concerns at this time.   Discharge Vitals:   BP (!) 181/88 (  BP Location: Right Arm)   Pulse 65   Temp 97.8 F (36.6 C) (Oral)   Resp 14   SpO2 97%   OBJECTIVE:  Constitutional: Appears well-developed and well-nourished. No distress.  HENT: Normocephalic and atraumatic, EOMI, moist mucous membranes, poor dentition Cardiovascular: S1 and S2 present, no murmurs, rubs, gallops.  Distal pulses intact.  Respiratory: No respiratory distress, no accessory muscle use. On Room air Lungs are clear to auscultation bilaterally. GI: Nondistended, soft, nontender to palpation, normal bowel sounds Musculoskeletal: Normal bulk and tone.  No peripheral edema noted. Neurological: Is alert and oriented x3, No dysarthria or facial asymmetry noted; CN II-XII grossly intact; strength 5/5 in bilateral upper and lower extremities; sensation to light touch intact Skin: Warm and dry.  No rash, erythema, lesions noted. Psychiatric:  Normal mood and affect.   Pertinent Labs, Studies, and Procedures:  CBC Latest Ref Rng & Units 06/24/2021 06/06/2021 06/04/2021  WBC 4.0 - 10.5 K/uL 7.5 15.3(H) 8.7  Hemoglobin 13.0 - 17.0 g/dL 60.7 37.1 06.2  Hematocrit 39.0 - 52.0 % 45.0 46.0 49.5  Platelets 150 - 400 K/uL 354 279 248    CMP Latest Ref Rng & Units 06/24/2021 06/06/2021 05/28/2021  Glucose 70 - 99 mg/dL 694(W) 546(E) 703(J)  BUN 8 - 23 mg/dL 16 15 14   Creatinine 0.61 - 1.24 mg/dL 0.09 3.81  Sodium 135 - 145 mmol/L 137 135 135  Potassium 3.5 - 5.1 mmol/L 4.3 4.5 4.0  Chloride 98 - 111 mmol/L 103 105 103  CO2 22 - 32 mmol/L 27 20(L) 25  Calcium 8.9 - 10.3 mg/dL 9.2 8.9 8.29)  Total Protein 6.5 - 8.1 g/dL - - -  Total Bilirubin 0.3 - 1.2 mg/dL - - -  Alkaline Phos 38 - 126 U/L - - -  AST 15 - 41 U/L - - -  ALT 0 - 44 U/L - - -    MR ANGIO HEAD WO CONTRAST  Result Date: 06/25/2021 CLINICAL DATA:  Neuro deficit acute stroke suspected EXAM: MRA HEAD WITHOUT CONTRAST TECHNIQUE: Angiographic images of the Circle of Willis were acquired using MRA technique without intravenous contrast. COMPARISON:  CTA of the head and neck from 05/27/2021 FINDINGS: Anterior circulation: Carotid arteries, major vessels are diffusely patent. No major branch occlusion, beading, or aneurysm is seen. Generalized vessel irregularity from motion artifact. Posterior circulation: The vertebral and basilar arteries are smoothly contoured and diffusely patent. Fetal type left PCA. No branch occlusion, beading, or aneurysm. Anatomic variants: As above IMPRESSION: No emergent finding or flow-limiting stenosis. Stable compared to CTA earlier this month. Electronically Signed   By: 07/27/2021 M.D.   On: 06/25/2021 07:03   MR BRAIN WO CONTRAST  Result Date: 06/25/2021 CLINICAL DATA:  Leg numbness and agitation EXAM: MRI HEAD WITHOUT CONTRAST TECHNIQUE: Multiplanar, multiecho pulse sequences of the brain and surrounding structures were obtained without  intravenous contrast. COMPARISON:  05/27/2021 FINDINGS: Brain: Recent right perirolandic infarcts as previously demonstrated, with fading. Two new acute infarcts are seen in the high posterior right frontal cortex. New subcortical parasagittal right frontal tiny infarct which is in the ACA territory. No acute hemorrhage, hydrocephalus, or masslike finding. Normal brain volume and white matter appearance for age. Vascular: Preserved flow voids Skull and upper cervical spine: Normal marrow signal Sinuses/Orbits: Right cataract resection. Chronic right frontal sinusitis with opacification from peripheral mucosal thickening and central inspissated material. Other: Motion artifact IMPRESSION: Small acute to subacute infarcts in the high right frontal lobe (ACA and MCA  distribution) since comparison 05/27/2021. There has been expected fading of other recent infarcts in this territory on prior. 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 Electronically Signed   By: Marnee Spring M.D.   On: 06/25/2021 05:48   MR LUMBAR SPINE WO CONTRAST  Result Date: 06/25/2021 CLINICAL DATA:  Intervertebral disc degeneration evaluate degenerative disc disease. History of abdominal aortic aneurysm and bilateral common iliac artery aneurysms. EXAM: MRI LUMBAR SPINE WITHOUT CONTRAST TECHNIQUE: Multiplanar, multisequence MR imaging of the lumbar spine was performed. No intravenous contrast was administered. COMPARISON:  X-ray 08/14/2005, CT 04/12/2019 FINDINGS: Segmentation:  Standard. Alignment:  Physiologic. Vertebrae:  No fracture, evidence of discitis, or bone lesion. Conus medullaris and cauda equina: Conus extends to the L1 level. Conus and cauda equina appear normal. Paraspinal and other soft tissues: Bilateral common iliac artery aneurysms measuring approximately 2.2 cm on the left and 2.1 cm on the right, not appreciably changed from prior CT of 2020. Known abdominal aortic  aneurysm is not well seen the current study. Disc levels: T12-L1: No significant disc protrusion, foraminal stenosis, or canal stenosis. L1-L2: Tiny posterior annular fissure. No significant disc protrusion, foraminal stenosis, or canal stenosis. L2-L3: Mild disc desiccation with minimal annular disc bulge. No foraminal or canal stenosis. L3-L4: Mild circumferential disc bulge. Mild bilateral facet hypertrophy. No foraminal or canal stenosis. L4-L5: Minimal annular disc bulge. Mild bilateral facet arthropathy. No foraminal or canal stenosis. L5-S1: Small central disc protrusion. Moderate bilateral facet arthropathy. Mild right foraminal stenosis. No canal or left foraminal stenosis. IMPRESSION: 1. Mild multilevel degenerative changes of the lumbar spine including mild right foraminal stenosis at L5-S1. No canal stenosis at any level. 2. Bilateral common iliac artery aneurysms, not appreciably changed from prior CT of 2020. Known abdominal aortic aneurysm is not well seen the current study. Electronically Signed   By: Duanne Guess D.O.   On: 06/25/2021 13:30   VAS US CAROTID  Result Date: 06/25/2021 Carotid Arterial Duplex Study Patient Name:  Tyler Burns  Date of Exam:   06/25/2021 Medical Rec #: [id number]      Accession #:    [id number] Date of Birth: 01/13/1956      Patient Gender: M Patient Age:   3 years Exam Location:  [facility] Procedure:      VAS US CAROTID Referring Phys: Debe Coder --------------------------------------------------------------------------------  Indications:       CVA and Right stent. Risk Factors:      Hypertension, prior CVA. Comparison Study:  05/28/21 prior Performing Technologist: Argentina Ponder RVS  Examination Guidelines: A complete evaluation includes B-mode imaging, spectral Doppler, color Doppler, and power Doppler as needed of all accessible portions of each vessel. Bilateral testing is considered an integral part of a complete examination. Limited  examinations for reoccurring indications may be performed as noted.  Right Carotid Findings: +----------+--------+--------+--------+------------------+--------+           PSV cm/sEDV cm/sStenosisPlaque DescriptionComments +----------+--------+--------+--------+------------------+--------+ CCA Prox  70      17              heterogenous               +----------+--------+--------+--------+------------------+--------+ CCA Distal78      20              heterogenous               +----------+--------+--------+--------+------------------+--------+ ECA       79                                                 +----------+--------+--------+--------+------------------+--------+ +----------+--------+-------+--------+-------------------+  PSV cm/sEDV cmsDescribeArm Pressure (mmHG) +----------+--------+-------+--------+-------------------+ [id number]                                         +----------+--------+-------+--------+-------------------+ +---------+--------+--+--------+--+---------+ VertebralPSV cm/s34EDV cm/s11Antegrade +---------+--------+--+--------+--+---------+  Right Stent(s): +---------------+--+--+-------------+++ Prox to Stent  4511              +---------------+--+--+-------------+++ Proximal Stent 5917              +---------------+--+--+-------------+++ Mid Stent      6529<50% stenosis +---------------+--+--+-------------+++ Distal Stent   6522              +---------------+--+--+-------------+++ Distal to [id number]              +---------------+--+--+-------------+++   Left Carotid Findings: +----------+--------+--------+--------+------------------+--------+           PSV cm/sEDV cm/sStenosisPlaque DescriptionComments +----------+--------+--------+--------+------------------+--------+ CCA Prox  50      12              heterogenous                +----------+--------+--------+--------+------------------+--------+ CCA Distal81      21              heterogenous               +----------+--------+--------+--------+------------------+--------+ ICA Prox  83      27      1-39%   heterogenous               +----------+--------+--------+--------+------------------+--------+ ICA Distal54      15                                         +----------+--------+--------+--------+------------------+--------+ ECA       117     10                                         +----------+--------+--------+--------+------------------+--------+ +----------+--------+--------+--------+-------------------+           PSV cm/sEDV cm/sDescribeArm Pressure (mmHG) +----------+--------+--------+--------+-------------------+ [id number]                                          +----------+--------+--------+--------+-------------------+ +---------+--------+--+--------+-+---------+ VertebralPSV cm/s32EDV cm/s8Antegrade +---------+--------+--+--------+-+---------+   Summary: Right Carotid: ICA stent <50% stenosis. Left Carotid: Velocities in the left ICA are consistent with a 1-39% stenosis. Vertebrals: Bilateral vertebral arteries demonstrate antegrade flow. *See table(s) above for measurements and observations.     Preliminary      Signed: Carmel [place], MD Internal Medicine Resident, PGY-1 [facility]  Pager: [phone] 2:03 PM, 06/26/2021

## 2021-06-26 NOTE — Progress Notes (Signed)
  Date: 06/26/2021  Patient name: Tyler Burns  Medical record number: [id number]  Date of birth: 1955-12-02   I have seen and evaluated Tyler Burns and discussed their care with the Residency Team. Briefly, Tyler Burns came in with concern for stroke given LLE weakness.  Work up showed subacute infarcts right frontal load, possibly due to embolic disease.  Symptoms have improved and site of acute stroke not consistent with symptoms.  MRI lumbar spine did not show any significant stenosis.  He was sitting on a hard bucket prior to symptoms, so possibly related to peripheral sciatica or leg falling asleep.  Strength is back to normal today.   Vitals:   06/26/21 0747 06/26/21 1112  BP: (!) 154/102 (!) 181/88  Pulse: 97 65  Resp: 18 14  Temp: 97.7 F (36.5 C) 97.8 F (36.6 C)  SpO2: 97% 97%   Gen: Lying in bed, no distress Eyes: anicteric sclerae, no injection HENT: Neck is supple, MMM Pulm: Breathing comfortably on room air Abd: Obese, NT MSK: Normal tone and bulk Neuro: Strength 5/5 throughout, speech normal  Assessment and Plan: I have seen and evaluated the patient as outlined above. I agree with the formulated Assessment and Plan as detailed in the residents' note, with the following changes:   1. Acute CVA, possibly from an embolic source - Patient is currently on DAPT following previous stroke and Rt carotid stenting.  This was intended to go for 21 days followed by aspirin/eliquis given concomitant permanent Afib.  - Will discuss with vascular team regarding starting eliquis and stopping plavix prior to discharge - Telemetry - Continue statin - PT/OT evaluation - Dietician for dietary recommendations  2. Permanent Afib - Restart metoprolol for rate control - Anticoagulation plan as above.   Other issues per Dr. Eliane Decree note from today.  We discussed plan with Neurology at bedside, Dr. Pearlean Brownie.   Inez Catalina, MD 8/31/20221:11 PM

## 2021-06-27 ENCOUNTER — Other Ambulatory Visit: Payer: Self-pay

## 2021-06-27 ENCOUNTER — Ambulatory Visit: Payer: Medicare Other | Admitting: Interventional Cardiology

## 2021-06-27 ENCOUNTER — Encounter: Payer: Self-pay | Admitting: Interventional Cardiology

## 2021-06-27 VITALS — BP 120/70 | HR 89 | Ht 72.0 in | Wt 227.8 lb

## 2021-06-27 DIAGNOSIS — I10 Essential (primary) hypertension: Secondary | ICD-10-CM

## 2021-06-27 DIAGNOSIS — I714 Abdominal aortic aneurysm, without rupture, unspecified: Secondary | ICD-10-CM

## 2021-06-27 DIAGNOSIS — E782 Mixed hyperlipidemia: Secondary | ICD-10-CM

## 2021-06-27 DIAGNOSIS — I4821 Permanent atrial fibrillation: Secondary | ICD-10-CM | POA: Diagnosis not present

## 2021-06-27 NOTE — Patient Instructions (Signed)
Medication Instructions:  Your physician recommends that you continue on your current medications as directed. Please refer to the Current Medication list given to you today.  *If you need a refill on your cardiac medications before your next appointment, please call your pharmacy*   Lab Work: none If you have labs (blood work) drawn today and your tests are completely normal, you will receive your results only by: MyChart Message (if you have MyChart) OR A paper copy in the mail If you have any lab test that is abnormal or we need to change your treatment, we will call you to review the results.   Testing/Procedures: none   Follow-Up: At [facility], you and your health needs are our priority.  As part of our continuing mission to provide you with exceptional heart care, we have created designated Provider Care Teams.  These Care Teams include your primary Cardiologist (physician) and Advanced Practice Providers (APPs -  Physician Assistants and Nurse Practitioners) who all work together to provide you with the care you need, when you need it.  We recommend signing up for the patient portal called "MyChart".  Sign up information is provided on this After Visit Summary.  MyChart is used to connect with patients for Virtual Visits (Telemedicine).  Patients are able to view lab/test results, encounter notes, upcoming appointments, etc.  Non-urgent messages can be sent to your provider as well.   To learn more about what you can do with MyChart, go to [email or web address].    Your next appointment:   March 2,2023 at 10:20  The format for your next appointment:   In Person  Provider:   Everette Rank, MD   Other Instructions

## 2021-07-04 ENCOUNTER — Other Ambulatory Visit: Payer: Self-pay

## 2021-07-04 DIAGNOSIS — I6521 Occlusion and stenosis of right carotid artery: Secondary | ICD-10-CM

## 2021-07-09 ENCOUNTER — Encounter (HOSPITAL_COMMUNITY): Payer: Medicare Other

## 2021-07-09 ENCOUNTER — Other Ambulatory Visit: Payer: Self-pay

## 2021-07-09 ENCOUNTER — Ambulatory Visit (INDEPENDENT_AMBULATORY_CARE_PROVIDER_SITE_OTHER): Payer: Medicare Other | Admitting: Physician Assistant

## 2021-07-09 VITALS — BP 144/80 | HR 64 | Temp 98.6°F | Resp 20 | Ht 72.0 in | Wt 222.0 lb

## 2021-07-09 DIAGNOSIS — I6521 Occlusion and stenosis of right carotid artery: Secondary | ICD-10-CM

## 2021-07-09 NOTE — Progress Notes (Signed)
POST OPERATIVE OFFICE NOTE    CC:  F/u for surgery  HPI:  This is a 65 y.o. male who is s/p right TCAR on 06/05/21 by Dr. Chestine Spore.   He is on Eliquis for Afib, Lipitor and Plavix daily.   Pt returns today for follow up.  Pt states he has no symptoms of stroke/TIA.  No amaurosis, aphasia or weakness in extremities.  Allergies  Allergen Reactions   Brinzolamide-Brimonidine Shortness Of Breath    Feels he has difficulty breathing when on medication Eye drop medication    Current Outpatient Medications  Medication Sig Dispense Refill   acetaminophen (TYLENOL) 325 MG tablet Take 2 tablets (650 mg total) by mouth every 4 (four) hours as needed for mild pain (or temp > 37.5 C (99.5 F)). 90 tablet    apixaban (ELIQUIS) 5 MG TABS tablet Take 1 tablet (5 mg total) by mouth 2 (two) times daily. 60 tablet 0   atorvastatin (LIPITOR) 80 MG tablet Take 1 tablet (80 mg total) by mouth daily with supper. 30 tablet 0   bimatoprost (LUMIGAN) 0.01 % SOLN Place 1 drop into both eyes at bedtime.     brinzolamide (AZOPT) 1 % ophthalmic suspension Place 1 drop into both eyes 2 (two) times a day.     cetirizine (ZYRTEC) 10 MG tablet Take 10 mg by mouth daily.     clopidogrel (PLAVIX) 75 MG tablet Take 1 tablet (75 mg total) by mouth daily. 90 tablet 0   metoprolol succinate (TOPROL-XL) 50 MG 24 hr tablet Take 50 mg by mouth daily. Take with or immediately following a meal.     Omega-3 Fatty Acids (FISH OIL) 600 MG CAPS Take 600 mg by mouth daily.      vitamin E 400 UNIT capsule Take 400 Units by mouth daily.      No current facility-administered medications for this visit.     ROS:  See HPI  Physical Exam:      Right Carotid Findings:  +----------+--------+--------+--------+------------------+--------+            PSV cm/sEDV cm/sStenosisPlaque DescriptionComments  +----------+--------+--------+--------+------------------+--------+  CCA Prox  70      17              heterogenous                 +----------+--------+--------+--------+------------------+--------+  CCA Distal78      20              heterogenous                +----------+--------+--------+--------+------------------+--------+  ECA       79                                                  +----------+--------+--------+--------+------------------+--------+   +----------+--------+-------+--------+-------------------+            PSV cm/sEDV cmsDescribeArm Pressure (mmHG)  +----------+--------+-------+--------+-------------------+  [id number]                                          +----------+--------+-------+--------+-------------------+   +---------+--------+--+--------+--+---------+  VertebralPSV cm/s34EDV cm/s11Antegrade  +---------+--------+--+--------+--+---------+       Right Stent(s):  +---------------+--+--+-------------+++  Prox to Stent  4511               +---------------+--+--+-------------+++  Proximal Stent 5917               +---------------+--+--+-------------+++  Mid Stent      6529<50% stenosis  +---------------+--+--+-------------+++  Distal Stent   6522               +---------------+--+--+-------------+++  Distal to [phone]               +---------------+--+--+-------------+++            Left Carotid Findings:  +----------+--------+--------+--------+------------------+--------+            PSV cm/sEDV cm/sStenosisPlaque DescriptionComments  +----------+--------+--------+--------+------------------+--------+  CCA Prox  50      12              heterogenous                +----------+--------+--------+--------+------------------+--------+  CCA Distal81      21              heterogenous                +----------+--------+--------+--------+------------------+--------+  ICA Prox  83      27      1-39%   heterogenous                 +----------+--------+--------+--------+------------------+--------+  ICA Distal54      15                                          +----------+--------+--------+--------+------------------+--------+  ECA       117     10                                          +----------+--------+--------+--------+------------------+--------+   +----------+--------+--------+--------+-------------------+            PSV cm/sEDV cm/sDescribeArm Pressure (mmHG)  +----------+--------+--------+--------+-------------------+  [id number]                                           +----------+--------+--------+--------+-------------------+   +---------+--------+--+--------+-+---------+  VertebralPSV cm/s32EDV cm/s8Antegrade  +---------+--------+--+--------+-+---------+      Summary:  Right Carotid: ICA stent <50% stenosis.   Left Carotid: Velocities in the left ICA are consistent with a 1-39%  stenosis.   Vertebrals: Bilateral vertebral arteries demonstrate antegrade flow.   Incision:  Well healed Extremities:  5/5 strength in all 4 ext, palpable radial pulses B Neuro: no neurologic deficits. No tongue deviation or facial droop    Assessment/Plan:  This is a 65 y.o. male who is s/p: right TCAR on 06/05/21 by Dr. Chestine Spore He will f/u in 9 months for carotid duplex and exam.  The stent is patent and his left ICA is < 39% stenosis.  He will increase his activity as tolerates, eat a healthy diet and continue with current medications as prescribed.     Mosetta Pigeon PA-C [facility] [phone]   Clinic MD:  Chestine Spore

## 2021-07-11 ENCOUNTER — Other Ambulatory Visit: Payer: Self-pay

## 2021-07-11 DIAGNOSIS — I6521 Occlusion and stenosis of right carotid artery: Secondary | ICD-10-CM

## 2021-07-16 ENCOUNTER — Other Ambulatory Visit: Payer: Self-pay | Admitting: Student

## 2021-12-25 NOTE — Progress Notes (Signed)
?  ?Cardiology Office Note ? ? ?Date:  12/26/2021  ? ?ID:  Tyler Burns, DOB 04-Mar-1956, MRN [id number] ? ?PCP:  Antonietta Jewel, MD  ? ? ?Chief Complaint  ?Patient presents with  ? Follow-up  ? ?AFib ? ?Wt Readings from Last 3 Encounters:  ?12/26/21 212 lb (96.2 kg)  ?07/09/21 222 lb (100.7 kg)  ?06/27/21 227 lb 12.8 oz (103.3 kg)  ?  ? ?  ?History of Present Illness: ?Tyler Burns is a 66 y.o. male    with a hx of chronic atrial fibrillation, CKD, mildly depressed LV function.  CHADS2-VASc=1 (sCHF).  He has been treated with ASA only.   ?  ?He had a R inguinal hernia.  He had surgical open repair with Dr. Jackolyn Confer in 8/16. No cardiac issues with surgery. He had a low risk stress test, that showed a small area of ischemia.  This was medically managed.  No chest pain. ?  ?His activity is significantly limited by his chronic back pain. ?  ?He has a remote hx of smoking.  He is treated for HL.  He has a FHx of CAD (brother, mother with prior MI; sister died with CHF). ?  ?He declined anticoagulation in 2021 for his AFib.  ?On 06/05/2021 ?Admitted with a stroke in 05/2021-subacute stroke on MRI are in the setting or TCAR : "Patient was recommended for starting Eliquis after completing three weeks of dual antiplatelet therapy" ? ?Leg strength returned.  He did have a repeat hospital visit and ultimately,Apixaban was started and aspirin was stopped.  Continuing Plavix. ? ?No bleeding problems.  Strength in arms and legs improved.   ? ?Denies : Chest pain. Dizziness. Leg edema. Nitroglycerin use. Orthopnea. Palpitations. Paroxysmal nocturnal dyspnea. Shortness of breath. Syncope.   ? ?Walking frequently when weather cooperates.   ?  ? ? ? ?Past Medical History:  ?Diagnosis Date  ? Asthma   ? Dysrhythmia   ? Afib  ? GERD (gastroesophageal reflux disease)   ? Glaucoma   ? High cholesterol   ? History of cardiovascular stress test   ? Myoview 12/19:  EF 50%, No ischemia, Low Risk  ? Hypertension   ? Stroke [facility])   ? August  2022  ? ? ?Past Surgical History:  ?Procedure Laterality Date  ? ABDOMINAL AORTIC ANEURYSM REPAIR N/A 05/25/2019  ? Procedure: ANEURYSM ABDOMINAL AORTIC REPAIR using a HEMASHIELD GOLD VASCULAR GRAFT;  Surgeon: [name], MD;  Location: [facility];  Service: Vascular;  Laterality: N/A;  ? APPENDECTOMY    ? EYE SURGERY Right   ? cataract  ? INGUINAL HERNIA REPAIR Right 06/15/2015  ? Procedure: RIGHT INGUINAL HERNIA REPAIR WITH MESH;  Surgeon: Jackolyn Confer, MD;  Location: [place];  Service: General;  Laterality: Right;  ? INSERTION OF MESH Right 06/15/2015  ? Procedure: INSERTION OF MESH;  Surgeon: Jackolyn Confer, MD;  Location: [place];  Service: General;  Laterality: Right;  ? TRANSCAROTID ARTERY REVASCULARIZATION?  Right 06/05/2021  ? Procedure: RIGHT TRANSCAROTID ARTERY REVASCULARIZATION;  Surgeon: Marty Heck, MD;  Location: [place];  Service: Vascular;  Laterality: Right;  ? VASCULAR SURGERY    ? ? ? ?Current Outpatient Medications  ?Medication Sig Dispense Refill  ? acetaminophen (TYLENOL) 325 MG tablet Take 2 tablets (650 mg total) by mouth every 4 (four) hours as needed for mild pain (or temp > 37.5 C (99.5 F)). 90 tablet   ? apixaban (ELIQUIS) 5 MG TABS tablet Take 1 tablet (5  mg total) by mouth 2 (two) times daily. 60 tablet 0  ? atorvastatin (LIPITOR) 80 MG tablet Take 1 tablet (80 mg total) by mouth daily with supper. 30 tablet 0  ? bimatoprost (LUMIGAN) 0.01 % SOLN Place 1 drop into both eyes at bedtime.    ? cetirizine (ZYRTEC) 10 MG tablet Take 10 mg by mouth daily as needed.    ? clopidogrel (PLAVIX) 75 MG tablet Take 1 tablet (75 mg total) by mouth daily. 90 tablet 0  ? metoprolol succinate (TOPROL-XL) 50 MG 24 hr tablet Take 50 mg by mouth daily. Take with or immediately following a meal.    ? Omega-3 Fatty Acids (FISH OIL) 600 MG CAPS Take 600 mg by mouth daily.     ? vitamin E 400 UNIT capsule Take 400 Units by mouth daily.     ? cyclobenzaprine (FLEXERIL) 10 MG tablet Take 10 mg by mouth 2  (two) times daily as needed.    ? ?No current facility-administered medications for this visit.  ? ? ?Allergies:   Brinzolamide-brimonidine  ? ? ?Social History:  The patient  reports that he quit smoking about 53 years ago. His smoking use included cigarettes. He has never used smokeless tobacco. He reports that he does not drink alcohol and does not use drugs.  ? ?Family History:  The patient's family history includes Congestive Heart Failure in his mother; Heart attack (age of onset: 76) in his brother; Heart attack (age of onset: 19) in his mother; Heart failure (age of onset: 36) in his sister; Leukemia in his brother.  ? ? ?ROS:  Please see the history of present illness.   Otherwise, review of systems are positive for bruising.   All other systems are reviewed and negative.  ? ? ?PHYSICAL EXAM: ?VS:  BP 132/82   Pulse 65   Ht 6' (1.829 m)   Wt 212 lb (96.2 kg)   SpO2 97%   BMI 28.75 kg/m?  , BMI Body mass index is 28.75 kg/m?. ?GEN: Well nourished, well developed, in no acute distress ?HEENT: normal ?Neck: no JVD, carotid bruits, or masses ?Cardiac: irregularly irregular; no murmurs, rubs, or gallops,no edema  ?Respiratory:  clear to auscultation bilaterally, normal work of breathing ?GI: soft, nontender, nondistended, + BS ?MS: no deformity or atrophy ?Skin: warm and dry, no rash; seborrheic keratosis on the right sided of head above ear ?Neuro:  Strength and sensation are intact ?Psych: euthymic mood, full affect ? ? ?Last ECG ordered demonstrates atrial fibrillation with controlled ventricular response ? ? ?Recent Labs: ?05/27/2021: ALT 15 ?06/24/2021: BUN 16; Creatinine, Ser 1.03; Hemoglobin 14.8; Platelets 354; Potassium 4.3; Sodium 137  ? ?Lipid Panel ?   ?Component Value Date/Time  ? CHOL 108 06/06/2021 0404  ? TRIG 80 06/06/2021 0404  ? HDL 23 (L) 06/06/2021 0404  ? CHOLHDL 4.7 06/06/2021 0404  ? VLDL 16 06/06/2021 0404  ? [place] 69 06/06/2021 0404  ? ?  ?Other studies Reviewed: ?Additional  studies/ records that were reviewed today with results demonstrating: Labs reviewed. ? ? ?ASSESSMENT AND PLAN: ? ?Atrial fibrillation: Rate controlled.  Eliquis for stroke prevention.  Acquired thrombophilia in the setting of A-fib.  Continuing Plavix as well.  Watch for signs of internal bleeding. ?AAA: Status post repair.  Followed by [facility]. ?Hypertension:The current medical regimen is effective;  continue present plan and medications. ?Hyperlipidemia: LDL 69 in August 2022.  Whole food, plant-based diet recommended.  Avoid processed foods. ? ? ?Current medicines are reviewed  at length with the patient today.  The patient concerns regarding his medicines were addressed. ? ?The following changes have been made:  No change ? ?Labs/ tests ordered today include CBC and be met today ?No orders of the defined types were placed in this encounter. ? ? ?Recommend 150 minutes/week of aerobic exercise ?Low fat, low carb, high fiber diet recommended ? ?Disposition:   FU in 6 months ? ? ?Signed, ?Larae Grooms, MD  ?12/26/2021 10:46 AM    ?[place] ?[address], [place] [employer], [place]  [address] ?Phone: [phone]; Fax: [phone]  ?

## 2021-12-26 ENCOUNTER — Other Ambulatory Visit: Payer: Self-pay

## 2021-12-26 ENCOUNTER — Ambulatory Visit: Payer: Medicare Other | Admitting: Interventional Cardiology

## 2021-12-26 ENCOUNTER — Encounter: Payer: Self-pay | Admitting: Interventional Cardiology

## 2021-12-26 VITALS — BP 132/82 | HR 65 | Ht 72.0 in | Wt 212.0 lb

## 2021-12-26 DIAGNOSIS — I4821 Permanent atrial fibrillation: Secondary | ICD-10-CM | POA: Diagnosis not present

## 2021-12-26 DIAGNOSIS — I7143 Infrarenal abdominal aortic aneurysm, without rupture: Secondary | ICD-10-CM | POA: Diagnosis not present

## 2021-12-26 DIAGNOSIS — E782 Mixed hyperlipidemia: Secondary | ICD-10-CM | POA: Diagnosis not present

## 2021-12-26 DIAGNOSIS — I10 Essential (primary) hypertension: Secondary | ICD-10-CM

## 2021-12-26 LAB — BASIC METABOLIC PANEL
BUN/Creatinine Ratio: 16 (ref 10–24)
BUN: 20 mg/dL (ref 8–27)
CO2: 28 mmol/L (ref 20–29)
Calcium: 9.7 mg/dL (ref 8.6–10.2)
Chloride: 101 mmol/L (ref 96–106)
Creatinine, Ser: 1.29 mg/dL — ABNORMAL HIGH (ref 0.76–1.27)
Glucose: 123 mg/dL — ABNORMAL HIGH (ref 70–99)
Potassium: 4.6 mmol/L (ref 3.5–5.2)
Sodium: 141 mmol/L (ref 134–144)
eGFR: 61 mL/min/{1.73_m2} (ref >59)

## 2021-12-26 LAB — CBC
Hematocrit: 46.8 % (ref 37.5–51.0)
Hemoglobin: 16 g/dL (ref 13.0–17.7)
MCH: 30.6 pg (ref 26.6–33.0)
MCHC: 34.2 g/dL (ref 31.5–35.7)
MCV: 90 fL (ref 79–97)
Platelets: 222 10*3/uL (ref 150–450)
RBC: 5.23 x10E6/uL (ref 4.14–5.80)
RDW: 13.1 % (ref 11.6–15.4)
WBC: 7.6 10*3/uL (ref 3.4–10.8)

## 2021-12-26 NOTE — Patient Instructions (Signed)
Medication Instructions:  ?Your physician recommends that you continue on your current medications as directed. Please refer to the Current Medication list given to you today. ? ?*If you need a refill on your cardiac medications before your next appointment, please call your pharmacy* ? ? ?Lab Work: ?Lab work to be done today--CBC and BMP ?If you have labs (blood work) drawn today and your tests are completely normal, you will receive your results only by: ?MyChart Message (if you have MyChart) OR ?A paper copy in the mail ?If you have any lab test that is abnormal or we need to change your treatment, we will call you to review the results. ? ? ?Testing/Procedures: ?none ? ? ?Follow-Up: ?At [facility], you and your health needs are our priority.  As part of our continuing mission to provide you with exceptional heart care, we have created designated Provider Care Teams.  These Care Teams include your primary Cardiologist (physician) and Advanced Practice Providers (APPs -  Physician Assistants and Nurse Practitioners) who all work together to provide you with the care you need, when you need it. ? ?We recommend signing up for the patient portal called "MyChart".  Sign up information is provided on this After Visit Summary.  MyChart is used to connect with patients for Virtual Visits (Telemedicine).  Patients are able to view lab/test results, encounter notes, upcoming appointments, etc.  Non-urgent messages can be sent to your provider as well.   ?To learn more about what you can do with MyChart, go to [email or web address].   ? ?Your next appointment:   ?07/16/22 at 9:00 ? ?The format for your next appointment:   ?In Person ? ?Provider:   ?Lance Muss, MD   ? ? ?Other Instructions ?  ? ?

## 2022-01-10 ENCOUNTER — Telehealth: Payer: Self-pay | Admitting: Interventional Cardiology

## 2022-01-10 NOTE — Telephone Encounter (Signed)
I spoke with patient and reviewed recent CBC and BMP results with him ?

## 2022-01-10 NOTE — Telephone Encounter (Signed)
Patient is returning call to discuss lab results. 

## 2022-07-15 NOTE — Progress Notes (Deleted)
Cardiology Office Note   Date:  07/15/2022   ID:  Tyler, Burns 1956/01/26, MRN [id number]  PCP:  Antonietta Jewel, MD    No chief complaint on file.    Wt Readings from Last 3 Encounters:  12/26/21 212 lb (96.2 kg)  07/09/21 222 lb (100.7 kg)  06/27/21 227 lb 12.8 oz (103.3 kg)       History of Present Illness: Tyler Burns is a 66 y.o. male  with a hx of chronic atrial fibrillation, CKD, mildly depressed LV function.  CHADS2-VASc=2 (sCHF, age).  He had been treated with ASA only for years prior to age 65, and prior to CVA after TCAR.     He had a R inguinal hernia.  He had surgical open repair with Dr. Jackolyn Confer in 8/16. No cardiac issues with surgery. He had a low risk stress test, that showed a small area of ischemia.  This was medically managed.  No chest pain.   His activity is significantly limited by his chronic back pain.   He has a remote hx of smoking.  He is treated for HL.  He has a FHx of CAD (brother, mother with prior MI; sister died with CHF).   He declined anticoagulation in 2021 for his AFib.  On 06/05/2021 Admitted with a stroke in 05/2021-subacute stroke on MRI are in the setting or TCAR : "Patient was recommended for starting Eliquis after completing three weeks of dual antiplatelet therapy"   Leg strength returned.  He did have a repeat Burns visit and ultimately,Apixaban was started and aspirin was stopped.  Continuing Plavix.      Past Medical History:  Diagnosis Date   Asthma    Dysrhythmia    Afib   GERD (gastroesophageal reflux disease)    Glaucoma    High cholesterol    History of cardiovascular stress test    Myoview 12/19:  EF 50%, No ischemia, Low Risk   Hypertension    Stroke [facility] Burns)    August 2022    Past Surgical History:  Procedure Laterality Date   ABDOMINAL AORTIC ANEURYSM REPAIR N/A 05/25/2019   Procedure: ANEURYSM ABDOMINAL AORTIC REPAIR using a HEMASHIELD GOLD VASCULAR GRAFT;  Surgeon: [name], MD;   Location: [place];  Service: Vascular;  Laterality: N/A;   APPENDECTOMY     EYE SURGERY Right    cataract   INGUINAL HERNIA REPAIR Right 06/15/2015   Procedure: RIGHT INGUINAL HERNIA REPAIR WITH MESH;  Surgeon: Jackolyn Confer, MD;  Location: [place];  Service: General;  Laterality: Right;   INSERTION OF MESH Right 06/15/2015   Procedure: INSERTION OF MESH;  Surgeon: Jackolyn Confer, MD;  Location: [place];  Service: General;  Laterality: Right;   TRANSCAROTID ARTERY REVASCULARIZATION  Right 06/05/2021   Procedure: RIGHT TRANSCAROTID ARTERY REVASCULARIZATION;  Surgeon: Marty Heck, MD;  Location: [place];  Service: Vascular;  Laterality: Right;   VASCULAR SURGERY       Current Outpatient Medications  Medication Sig Dispense Refill   acetaminophen (TYLENOL) 325 MG tablet Take 2 tablets (650 mg total) by mouth every 4 (four) hours as needed for mild pain (or temp > 37.5 C (99.5 F)). 90 tablet    apixaban (ELIQUIS) 5 MG TABS tablet Take 1 tablet (5 mg total) by mouth 2 (two) times daily. 60 tablet 0   atorvastatin (LIPITOR) 80 MG tablet Take 1 tablet (80 mg total) by mouth daily with supper. 30 tablet 0   bimatoprost (  LUMIGAN) 0.01 % SOLN Place 1 drop into both eyes at bedtime.     cetirizine (ZYRTEC) 10 MG tablet Take 10 mg by mouth daily as needed.     clopidogrel (PLAVIX) 75 MG tablet Take 1 tablet (75 mg total) by mouth daily. 90 tablet 0   cyclobenzaprine (FLEXERIL) 10 MG tablet Take 10 mg by mouth 2 (two) times daily as needed.     metoprolol succinate (TOPROL-XL) 50 MG 24 hr tablet Take 50 mg by mouth daily. Take with or immediately following a meal.     Omega-3 Fatty Acids (FISH OIL) 600 MG CAPS Take 600 mg by mouth daily.      vitamin E 400 UNIT capsule Take 400 Units by mouth daily.      No current facility-administered medications for this visit.    Allergies:   Brinzolamide-brimonidine    Social History:  The patient  reports that he quit smoking about 53 years ago. His  smoking use included cigarettes. He has never used smokeless tobacco. He reports that he does not drink alcohol and does not use drugs.   Family History:  The patient's ***family history includes Congestive Heart Failure in his mother; Heart attack (age of onset: 53) in his brother; Heart attack (age of onset: 14) in his mother; Heart failure (age of onset: 65) in his sister; Leukemia in his brother.    ROS:  Please see the history of present illness.   Otherwise, review of systems are positive for ***.   All other systems are reviewed and negative.    PHYSICAL EXAM: VS:  There were no vitals taken for this visit. , BMI There is no height or weight on file to calculate BMI. GEN: Well nourished, well developed, in no acute distress HEENT: normal Neck: no JVD, carotid bruits, or masses Cardiac: ***RRR; no murmurs, rubs, or gallops,no edema  Respiratory:  clear to auscultation bilaterally, normal work of breathing GI: soft, nontender, nondistended, + BS MS: no deformity or atrophy Skin: warm and dry, no rash Neuro:  Strength and sensation are intact Psych: euthymic mood, full affect   EKG:   The ekg ordered today demonstrates ***   Recent Labs: 12/26/2021: BUN 20; Creatinine, Ser 1.29; Hemoglobin 16.0; Platelets 222; Potassium 4.6; Sodium 141   Lipid Panel    Component Value Date/Time   CHOL 108 06/06/2021 0404   TRIG 80 06/06/2021 0404   HDL 23 (L) 06/06/2021 0404   CHOLHDL 4.7 06/06/2021 0404   VLDL 16 06/06/2021 0404   LDLCALC 69 06/06/2021 0404     Other studies Reviewed: Additional studies/ records that were reviewed today with results demonstrating: ***.   ASSESSMENT AND PLAN:  AFib:  AAA: HTN: Hyperlipidemia:   Current medicines are reviewed at length with the patient today.  The patient concerns regarding his medicines were addressed.  The following changes have been made:  No change***  Labs/ tests ordered today include: *** No orders of the defined types  were placed in this encounter.   Recommend 150 minutes/week of aerobic exercise Low fat, low carb, high fiber diet recommended  Disposition:   FU in ***   Signed, Lance Muss, MD  07/15/2022 12:26 PM    [facility] [address] [place], [place], [place]  [address] Phone: [phone]; Fax: [phone]

## 2022-07-16 ENCOUNTER — Ambulatory Visit: Payer: Medicare Other | Admitting: Interventional Cardiology

## 2022-07-16 DIAGNOSIS — I4821 Permanent atrial fibrillation: Secondary | ICD-10-CM

## 2022-07-16 DIAGNOSIS — I7143 Infrarenal abdominal aortic aneurysm, without rupture: Secondary | ICD-10-CM

## 2022-07-16 DIAGNOSIS — E782 Mixed hyperlipidemia: Secondary | ICD-10-CM

## 2022-07-16 DIAGNOSIS — I10 Essential (primary) hypertension: Secondary | ICD-10-CM

## 2022-07-20 NOTE — Progress Notes (Signed)
Cardiology Office Note:    Date:  07/21/2022   ID:  Tyler Burns, DOB 09-22-1956, MRN [id number]  PCP:  Tyler Jewel, MD  [place] Providers Cardiologist:  Tyler Grooms, MD     Referring MD: Tyler Jewel, MD   Chief Complaint:  F/u for AFib    Patient Profile: Permanent atrial fibrillation  Pt declined anticoagulation in past Hx of CVA in 05/2021 Carotid artery disease S/p R TCAR in 05/2021 (Tyler Burns) Chronic kidney disease  Mild LV dysfunction Echocardiogram 09/2013: EF 45-50 Echocardiogram 8/22: EF 60-65 Hypertension  Hyperlipidemia  Abdominal aortic aneurysm s/p repair in 04/2019 FHx of CAD (Bro, Mom w MI; Sis w CHF) S/p RIH repair Chronic back pain - limited activity  Remote hx of Cigs GERD  Prior CV Studies: GATED SPECT MYO PERF W/LEXISCAN STRESS 1D 10/13/2018 Nuclear stress EF: 50%. No perfusion defects. This is a low risk study. No ischemia identified.   ECHO COMPLETE WITH IMAGING ENHANCING AGENT 05/27/2021 EF 60-65, mild LVH, normal RVSF, mild LAE, RA pressure 8  VAS US CAROTID DUPLEX BILATERAL 06/25/2021 Right Carotid: ICA stent <50% stenosis. Left Carotid: Velocities in the left ICA are consistent with a 1-39% stenosis.      History of Present Illness:   Tyler Burns is a 66 y.o. male with the above problem list.  He was last seen by Tyler Burns in March 2023. He returns for f/u. He is here alone. He is doing well without chest pain, shortness of breath, syncope, leg edema.         Past Medical History:  Diagnosis Date   Asthma    Dysrhythmia    Afib   GERD (gastroesophageal reflux disease)    Glaucoma    High cholesterol    History of cardiovascular stress test    Myoview 12/19:  EF 50%, No ischemia, Low Risk   Hypertension    Stroke [facility])    August 2022   Current Medications: Current Meds  Medication Sig   acetaminophen (TYLENOL) 325 MG tablet Take 2 tablets (650 mg total) by mouth every 4 (four) hours as needed for mild  pain (or temp > 37.5 C (99.5 F)).   apixaban (ELIQUIS) 5 MG TABS tablet Take 1 tablet (5 mg total) by mouth 2 (two) times daily.   atorvastatin (LIPITOR) 80 MG tablet Take 1 tablet (80 mg total) by mouth daily with supper.   bimatoprost (LUMIGAN) 0.01 % SOLN Place 1 drop into both eyes at bedtime.   cetirizine (ZYRTEC) 10 MG tablet Take 10 mg by mouth daily as needed.   clopidogrel (PLAVIX) 75 MG tablet Take 1 tablet (75 mg total) by mouth daily.   cyclobenzaprine (FLEXERIL) 10 MG tablet Take 10 mg by mouth 2 (two) times daily as needed.   metoprolol succinate (TOPROL-XL) 50 MG 24 hr tablet Take 50 mg by mouth daily. Take with or immediately following a meal.   Omega-3 Fatty Acids (FISH OIL) 600 MG CAPS Take 600 mg by mouth daily.     Allergies:   Brinzolamide-brimonidine   Social History   Tobacco Use   Smoking status: Former    Types: Cigarettes    Quit date: 1970    Years since quitting: 53.7   Smokeless tobacco: Never  Vaping Use   Vaping Use: Never used  Substance Use Topics   Alcohol use: No   Drug use: No    Family Hx: The patient's family history includes Congestive Heart Failure in his  mother; Heart attack (age of onset: 56) in his brother; Heart attack (age of onset: 72) in his mother; Heart failure (age of onset: 57) in his sister; Leukemia in his brother. There is no history of Stroke.  Review of Systems  Musculoskeletal:  Positive for back pain.  Gastrointestinal:  Negative for hematochezia and melena.  Genitourinary:  Negative for hematuria.     EKGs/Labs/Other Test Reviewed:    EKG:  EKG is  ordered today.  The ekg ordered today demonstrates atrial fibrillation, HR 78, normal axis, non-specific ST-TW changes, QTc 440 ms, no change from prior tracing   Recent Labs: 12/26/2021: BUN 20; Creatinine, Ser 1.29; Hemoglobin 16.0; Platelets 222; Potassium 4.6; Sodium 141   Recent Lipid Panel No results for input(s): "CHOL", "TRIG", "HDL", "VLDL", "LDLCALC", "LDLDIRECT"  in the last 8760 hours.   Risk Assessment/Calculations/Metrics:    CHA2DS2-VASc Score = 5   This indicates a 7.2% annual risk of stroke. The patient's score is based upon: CHF History: 0 HTN History: 1 Diabetes History: 0 Stroke History: 2 Vascular Disease History: 1 Age Score: 1 Gender Score: 0             Physical Exam:    VS:  BP 130/70   Pulse 78   Ht 6' (1.829 m)   Wt 187 lb 3.2 oz (84.9 kg)   SpO2 96%   BMI 25.39 kg/m     Wt Readings from Last 3 Encounters:  07/21/22 187 lb 3.2 oz (84.9 kg)  12/26/21 212 lb (96.2 kg)  07/09/21 222 lb (100.7 kg)    Constitutional:      Appearance: Healthy appearance. Not in distress.  Neck:     Vascular: JVD normal.  Pulmonary:     Effort: Pulmonary effort is normal.     Breath sounds: No wheezing. No rales.  Cardiovascular:     Normal rate. Irregularly irregular rhythm. Normal S1. Normal S2.      Murmurs: There is no murmur.  Edema:    Peripheral edema absent.  Abdominal:     Palpations: Abdomen is soft.  Skin:    General: Skin is warm and dry.  Neurological:     General: No focal deficit present.     Mental Status: Alert and oriented to person, place and time.         ASSESSMENT & PLAN:   Atrial fibrillation ([place]) Rate is controlled. He is tolerating anticoagulation. Continue Eliquis 5 mg twice daily. Obtain f/u CMET, CBC. F/u in 6 mos.   Essential hypertension BP is controlled. Continue Toprol XL 50 mg once daily.   Hyperlipidemia Continue Lipitor 80 mg once daily. Obtain fasting CMET, Lipids today.  AAA (abdominal aortic aneurysm) ([place]) S/p repair. F/u with vascular surgery.  Glucose intolerance He is concerned about Hgb A1c in Aug 22 at 6.3. I have asked him to f/u with his PCP.  Carotid artery stenosis S/p R TCAR in 8/22. He is concerned about taking Eliquis in addition to Plavix. I have asked him to f/u with vascular surgery to see if he needs to remain on Plavix long term. We discussed the  importance of remaining on Eliquis long term to reduce his risk of stroke in the setting of atrial fibrillation.            Dispo:  Return in about 6 months (around 01/19/2023) for Routine Follow Up w/ Tyler Burns .   Medication Adjustments/Labs and Tests Ordered: Current medicines are reviewed at length with the patient  today.  Concerns regarding medicines are outlined above.  Tests Ordered: Orders Placed This Encounter  Procedures   CBC   Comp Met (CMET)   Lipid panel   EKG 12-Lead   Medication Changes: No orders of the defined types were placed in this encounter.  Signed, Richardson Dopp, PA-C  07/21/2022 10:43 AM    [facility] [address], [place], [place]  [address] Phone: [phone]; Fax: [phone]

## 2022-07-21 ENCOUNTER — Ambulatory Visit: Payer: Medicare Other | Attending: Interventional Cardiology | Admitting: Physician Assistant

## 2022-07-21 ENCOUNTER — Encounter: Payer: Self-pay | Admitting: Physician Assistant

## 2022-07-21 VITALS — BP 130/70 | HR 78 | Ht 72.0 in | Wt 187.2 lb

## 2022-07-21 DIAGNOSIS — E782 Mixed hyperlipidemia: Secondary | ICD-10-CM | POA: Diagnosis not present

## 2022-07-21 DIAGNOSIS — I10 Essential (primary) hypertension: Secondary | ICD-10-CM

## 2022-07-21 DIAGNOSIS — I714 Abdominal aortic aneurysm, without rupture, unspecified: Secondary | ICD-10-CM

## 2022-07-21 DIAGNOSIS — I4821 Permanent atrial fibrillation: Secondary | ICD-10-CM | POA: Diagnosis not present

## 2022-07-21 DIAGNOSIS — E7439 Other disorders of intestinal carbohydrate absorption: Secondary | ICD-10-CM

## 2022-07-21 DIAGNOSIS — I6521 Occlusion and stenosis of right carotid artery: Secondary | ICD-10-CM

## 2022-07-21 NOTE — Assessment & Plan Note (Signed)
Rate is controlled. He is tolerating anticoagulation. Continue Eliquis 5 mg twice daily. Obtain f/u CMET, CBC. F/u in 6 mos.

## 2022-07-21 NOTE — Assessment & Plan Note (Signed)
S/p R TCAR in 8/22. He is concerned about taking Eliquis in addition to Plavix. I have asked him to f/u with vascular surgery to see if he needs to remain on Plavix long term. We discussed the importance of remaining on Eliquis long term to reduce his risk of stroke in the setting of atrial fibrillation.

## 2022-07-21 NOTE — Assessment & Plan Note (Signed)
S/p repair. F/u with vascular surgery.

## 2022-07-21 NOTE — Patient Instructions (Addendum)
Medication Instructions:  Your physician recommends that you continue on your current medications as directed. Please refer to the Current Medication list given to you today.  *If you need a refill on your cardiac medications before your next appointment, please call your pharmacy*   Lab Work: TODAY:  CMET, LIPID, & CBC  If you have labs (blood work) drawn today and your tests are completely normal, you will receive your results only by: [place] (if you have MyChart) OR A paper copy in the mail If you have any lab test that is abnormal or we need to change your treatment, we will call you to review the results.   Testing/Procedures: None ordered   Follow-Up: At [facility], you and your health needs are our priority.  As part of our continuing mission to provide you with exceptional heart care, we have created designated Provider Care Teams.  These Care Teams include your primary Cardiologist (physician) and Advanced Practice Providers (APPs -  Physician Assistants and Nurse Practitioners) who all work together to provide you with the care you need, when you need it.  We recommend signing up for the patient portal called "MyChart".  Sign up information is provided on this After Visit Summary.  MyChart is used to connect with patients for Virtual Visits (Telemedicine).  Patients are able to view lab/test results, encounter notes, upcoming appointments, etc.  Non-urgent messages can be sent to your provider as well.   To learn more about what you can do with MyChart, go to [email or web address].    Your next appointment:   6 month(s)  01/08/22 ARRIVE AT 9:45  The format for your next appointment:   In Person  Provider:   Larae Grooms, MD  or Richardson Dopp, PA-C         Other Instructions   Important Information About Sugar

## 2022-07-21 NOTE — Assessment & Plan Note (Signed)
Continue Lipitor 80 mg once daily. Obtain fasting CMET, Lipids today.

## 2022-07-21 NOTE — Assessment & Plan Note (Signed)
He is concerned about Hgb A1c in Aug 22 at 6.3. I have asked him to f/u with his PCP.

## 2022-07-21 NOTE — Assessment & Plan Note (Signed)
BP is controlled. Continue Toprol XL 50 mg once daily.

## 2022-07-22 ENCOUNTER — Ambulatory Visit: Payer: Medicare Other | Attending: Physician Assistant

## 2022-07-22 DIAGNOSIS — I714 Abdominal aortic aneurysm, without rupture, unspecified: Secondary | ICD-10-CM

## 2022-07-22 DIAGNOSIS — I10 Essential (primary) hypertension: Secondary | ICD-10-CM

## 2022-07-22 DIAGNOSIS — I6521 Occlusion and stenosis of right carotid artery: Secondary | ICD-10-CM

## 2022-07-22 DIAGNOSIS — I4821 Permanent atrial fibrillation: Secondary | ICD-10-CM

## 2022-07-22 DIAGNOSIS — E782 Mixed hyperlipidemia: Secondary | ICD-10-CM

## 2022-07-23 LAB — CBC
Hematocrit: 48 % (ref 37.5–51.0)
Hemoglobin: 15.4 g/dL (ref 13.0–17.7)
MCH: 29 pg (ref 26.6–33.0)
MCHC: 32.1 g/dL (ref 31.5–35.7)
MCV: 90 fL (ref 79–97)
Platelets: 272 10*3/uL (ref 150–450)
RBC: 5.31 x10E6/uL (ref 4.14–5.80)
RDW: 13.2 % (ref 11.6–15.4)
WBC: 10.4 10*3/uL (ref 3.4–10.8)

## 2022-07-23 LAB — COMPREHENSIVE METABOLIC PANEL
ALT: 19 IU/L (ref 0–44)
AST: 18 IU/L (ref 0–40)
Albumin/Globulin Ratio: 1.4 (ref 1.2–2.2)
Albumin: 3.9 g/dL (ref 3.9–4.9)
Alkaline Phosphatase: 67 IU/L (ref 44–121)
BUN/Creatinine Ratio: 19 (ref 10–24)
BUN: 19 mg/dL (ref 8–27)
Bilirubin Total: 0.9 mg/dL (ref 0.0–1.2)
CO2: 24 mmol/L (ref 20–29)
Calcium: 9.2 mg/dL (ref 8.6–10.2)
Chloride: 97 mmol/L (ref 96–106)
Creatinine, Ser: 1.01 mg/dL (ref 0.76–1.27)
Globulin, Total: 2.7 g/dL (ref 1.5–4.5)
Glucose: 116 mg/dL — ABNORMAL HIGH (ref 70–99)
Potassium: 4.4 mmol/L (ref 3.5–5.2)
Sodium: 133 mmol/L — ABNORMAL LOW (ref 134–144)
Total Protein: 6.6 g/dL (ref 6.0–8.5)
eGFR: 81 mL/min/{1.73_m2} (ref >59)

## 2022-07-23 LAB — LIPID PANEL
Chol/HDL Ratio: 3.8 ratio (ref 0.0–5.0)
Cholesterol, Total: 91 mg/dL — ABNORMAL LOW (ref 100–199)
HDL: 24 mg/dL — ABNORMAL LOW (ref >39)
LDL Chol Calc (NIH): 51 mg/dL (ref 0–99)
Triglycerides: 78 mg/dL (ref 0–149)
VLDL Cholesterol Cal: 16 mg/dL (ref 5–40)

## 2022-08-01 ENCOUNTER — Other Ambulatory Visit: Payer: Self-pay | Admitting: *Deleted

## 2022-08-01 DIAGNOSIS — I6521 Occlusion and stenosis of right carotid artery: Secondary | ICD-10-CM

## 2022-08-05 ENCOUNTER — Ambulatory Visit: Payer: Medicare Other | Admitting: Vascular Surgery

## 2022-08-05 ENCOUNTER — Encounter: Payer: Self-pay | Admitting: Vascular Surgery

## 2022-08-05 ENCOUNTER — Ambulatory Visit (HOSPITAL_COMMUNITY)
Admission: RE | Admit: 2022-08-05 | Discharge: 2022-08-05 | Disposition: A | Discharge status: Home / Self Care | Payer: Medicare Other | Source: Ambulatory Visit | Attending: Vascular Surgery | Admitting: Vascular Surgery

## 2022-08-05 VITALS — BP 144/83 | HR 62 | Temp 98.1°F | Resp 18 | Ht 72.0 in | Wt 189.0 lb

## 2022-08-05 DIAGNOSIS — I6521 Occlusion and stenosis of right carotid artery: Secondary | ICD-10-CM

## 2022-08-05 NOTE — Progress Notes (Signed)
Patient name: Tyler Burns MRN: [id number] DOB: [birth year] Sex: male  REASON FOR CONSULT: Carotid artery follow-up  HPI: Tyler Burns is a 66 y.o. male, with hx atrial fibrillation on Eliquis, CKD, and carotid artery disease that presents for carotid follow-up.  He previously underwent a right TCAR for high-grade symptomatic stenosis on 06/05/2021.  He reports no neurologic events since last follow-up.  Remains on Plavix.  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 Arbie Cookey with a 16 x 8 Hemashield graft from the aorta to bilateral common iliac arteries.  Past Medical History:  Diagnosis Date   Asthma    Dysrhythmia    Afib   GERD (gastroesophageal reflux disease)    Glaucoma    High cholesterol    History of cardiovascular stress test    Myoview 12/19:  EF 50%, No ischemia, Low Risk   Hypertension    Stroke [facility])    August 2022    Past Surgical History:  Procedure Laterality Date   ABDOMINAL AORTIC ANEURYSM REPAIR N/A 05/25/2019   Procedure: ANEURYSM ABDOMINAL AORTIC REPAIR using a HEMASHIELD GOLD VASCULAR GRAFT;  Surgeon: Larina Earthly, MD;  Location: [facility] OR;  Service: Vascular;  Laterality: N/A;   APPENDECTOMY     EYE SURGERY Right    cataract   INGUINAL HERNIA REPAIR Right 06/15/2015   Procedure: RIGHT INGUINAL HERNIA REPAIR WITH MESH;  Surgeon: Avel Peace, MD;  [facility] OR;  Service: General;  Laterality: Right;   INSERTION OF MESH Right 06/15/2015   Procedure: INSERTION OF MESH;  Surgeon: Avel Peace, MD;  Location: [facility] OR;  Service: General;  Laterality: Right;   TRANSCAROTID ARTERY REVASCULARIZATION  Right 06/05/2021   Procedure: RIGHT TRANSCAROTID ARTERY REVASCULARIZATION;  Surgeon: Cephus Shelling, MD;  Location: [facility] OR;  Service: Vascular;  Laterality: Right;   VASCULAR SURGERY      Family History  Problem Relation Age of Onset   Congestive Heart Failure Mother    Heart attack Mother 65   Heart attack  Brother 70   Leukemia Brother    Heart failure Sister 57   Stroke Neg Hx     SOCIAL HISTORY: Social History   Socioeconomic History   Marital status: Divorced    Spouse name: Not on file   Number of children: Not on file   Years of education: Not on file   Highest education level: Not on file  Occupational History   Not on file  Tobacco Use   Smoking status: Former    Types: Cigarettes    Quit date: 1970    Years since quitting: 53.8   Smokeless tobacco: Never  Vaping Use   Vaping Use: Never used  Substance and Sexual Activity   Alcohol use: No   Drug use: No   Sexual activity: Not on file  Other Topics Concern   Not on file  Social History Narrative   Not on file   Social Determinants of Health   Financial Resource Strain: Not on file  Food Insecurity: Not on file  Transportation Needs: Not on file  Physical Activity: Not on file  Stress: Not on file  Social Connections: Not on file  Intimate Partner Violence: Not on file    Allergies  Allergen Reactions   Brinzolamide-Brimonidine Shortness Of Breath    Feels he has difficulty breathing when on medication Eye drop medication    Current Outpatient Medications  Medication Sig Dispense Refill   acetaminophen (TYLENOL)  325 MG tablet Take 2 tablets (650 mg total) by mouth every 4 (four) hours as needed for mild pain (or temp > 37.5 C (99.5 F)). 90 tablet    apixaban (ELIQUIS) 5 MG TABS tablet Take 1 tablet (5 mg total) by mouth 2 (two) times daily. 60 tablet 0   bimatoprost (LUMIGAN) 0.01 % SOLN Place 1 drop into both eyes at bedtime.     cetirizine (ZYRTEC) 10 MG tablet Take 10 mg by mouth daily as needed.     clopidogrel (PLAVIX) 75 MG tablet Take 1 tablet (75 mg total) by mouth daily. 90 tablet 0   cyclobenzaprine (FLEXERIL) 10 MG tablet Take 10 mg by mouth 2 (two) times daily as needed.     metoprolol succinate (TOPROL-XL) 50 MG 24 hr tablet Take 50 mg by mouth daily. Take with or immediately following a  meal.     Omega-3 Fatty Acids (FISH OIL) 600 MG CAPS Take 600 mg by mouth daily.      atorvastatin (LIPITOR) 80 MG tablet Take 1 tablet (80 mg total) by mouth daily with supper. 30 tablet 0   No current facility-administered medications for this visit.    REVIEW OF SYSTEMS:  [X]  denotes positive finding, [ ]  denotes negative finding Cardiac  Comments:  Chest pain or chest pressure:    Shortness of breath upon exertion:    Short of breath when lying flat:    Irregular heart rhythm:        Vascular    Pain in calf, thigh, or hip brought on by ambulation:    Pain in feet at night that wakes you up from your sleep:     Blood clot in your veins:    Leg swelling:         Pulmonary    Oxygen at home:    Productive cough:     Wheezing:         Neurologic    Sudden weakness in arms or legs:     Sudden numbness in arms or legs:     Sudden onset of difficulty speaking or slurred speech:    Temporary loss of vision in one eye:     Problems with dizziness:         Gastrointestinal    Blood in stool:     Vomited blood:         Genitourinary    Burning when urinating:     Blood in urine:        Psychiatric    Major depression:         Hematologic    Bleeding problems:    Problems with blood clotting too easily:        Skin    Rashes or ulcers:        Constitutional    Fever or chills:      PHYSICAL EXAM: Vitals:   08/05/22 1553  BP: (!) 144/83  Pulse: 62  Resp: 18  Temp: 98.1 F (36.7 C)  TempSrc: Temporal  SpO2: 96%  Weight: 189 lb (85.7 kg)  Height: 6' (1.829 m)    GENERAL: The patient is a well-nourished male, in no acute distress. The vital signs are documented above. CARDIAC: There is a regular rate and rhythm.  VASCULAR:  Right neck incision well-healed PULMONARY: No respiratory distress. ABDOMEN: Soft and non-tender. MUSCULOSKELETAL: There are no major deformities or cyanosis. NEUROLOGIC: No focal weakness or paresthesias are detected.  Renal nerves  II through XII grossly  intact SKIN: There are no ulcers or rashes noted. PSYCHIATRIC: The patient has a normal affect.  DATA:   Carotid duplex today shows widely patent right carotid stent with 40 to 59% contralateral left ICA stenosis  Assessment/Plan:  66 year old male presents for surveillance after right TCAR for high-grade symptomatic carotid stenosis on 06/05/2021.  Discussed his right carotid stent is widely patent on duplex today.  He has 40 to 59% left ICA stenosis that is asymptomatic.  Discussed for asymptomatic disease does not require intervention unless more than 80% stenosis.  He has had a previous open juxtarenal AAA repair by Dr. Donnetta Hutching in 2020 and I discussed would need a surveillance CT at 5 years.  We will see him in 1 year with repeat carotid duplex.  He has been tolerating the Plavix without any issues for the carotid stent.  He asked about stopping Eliquis and I would defer this to his cardiologist as discussed with him.   Marty Heck, MD [facility] Office: [phone]

## 2022-12-30 LAB — LAB REPORT - SCANNED
A1c: 8.3
EGFR: 76
PSA, Total: 3.67

## 2023-01-09 ENCOUNTER — Ambulatory Visit: Payer: Medicare Other | Attending: Interventional Cardiology | Admitting: Interventional Cardiology

## 2023-01-09 ENCOUNTER — Encounter: Payer: Self-pay | Admitting: Interventional Cardiology

## 2023-01-09 VITALS — BP 172/96 | HR 51 | Ht 72.0 in | Wt 190.0 lb

## 2023-01-09 DIAGNOSIS — I10 Essential (primary) hypertension: Secondary | ICD-10-CM | POA: Diagnosis not present

## 2023-01-09 DIAGNOSIS — R7303 Prediabetes: Secondary | ICD-10-CM | POA: Diagnosis not present

## 2023-01-09 DIAGNOSIS — I4821 Permanent atrial fibrillation: Secondary | ICD-10-CM | POA: Diagnosis not present

## 2023-01-09 DIAGNOSIS — I6521 Occlusion and stenosis of right carotid artery: Secondary | ICD-10-CM

## 2023-01-09 DIAGNOSIS — I714 Abdominal aortic aneurysm, without rupture, unspecified: Secondary | ICD-10-CM

## 2023-01-09 NOTE — Progress Notes (Signed)
Cardiology Office Note   Date:  01/09/2023   ID:  Sophal, Shakoor 01/23/1956, MRN [id number]  PCP:  Antonietta Jewel, MD    No chief complaint on file.  AFib  Wt Readings from Last 3 Encounters:  01/09/23 190 lb (86.2 kg)  08/05/22 189 lb (85.7 kg)  07/21/22 187 lb 3.2 oz (84.9 kg)       History of Present Illness: AMARE CHESMORE is a 67 y.o. male  with a hx of chronic atrial fibrillation, CKD, mildly depressed LV function for over 10 years.  CHADS2-VASc=1 (sCHF) when forst diagnosed but has increased since.  He has been treated with ASA only initally.     He had a R inguinal hernia.  He had surgical open repair with Dr. Jackolyn Confer in 8/16. No cardiac issues with surgery. He had a low risk stress test, that showed a small area of ischemia.  This was medically managed.  No chest pain.   His activity is significantly limited by his chronic back pain.   He has a remote hx of smoking.  He is treated for HL.  He has a FHx of CAD (brother, mother with prior MI; sister died with CHF).   He declined anticoagulation in 2021 for his AFib.  On 06/05/2021 Admitted with a stroke in 05/2021-subacute stroke on MRI are in the setting or TCAR : "Patient was recommended for starting Eliquis after completing three weeks of dual antiplatelet therapy"   Leg strength returned.  He did have a repeat hospital visit and ultimately,Apixaban was started and aspirin was stopped.  Continuing Plavix.   No bleeding problems.  Strength in arms and legs improved.    Per visit in September 2023 by Richardson Dopp: "S/p R TCAR in 8/22. He is concerned about taking Eliquis in addition to Plavix. I have asked him to f/u with vascular surgery to see if he needs to remain on Plavix long term. We discussed the importance of remaining on Eliquis long term to reduce his risk of stroke in the setting of atrial fibrillation. "  Reports random chest twinges- lasting a few seconds.  No relation to exertion.   Denies :  exertional Chest pain. Dizziness. Leg edema. Nitroglycerin use. Orthopnea. Palpitations. Paroxysmal nocturnal dyspnea. Shortness of breath. Syncope.    Walking is most strenuous activity.  No sx with walking.   No GI bleeding problem.  Has some easy bruising.  Routine carotid ultrasound done every year- Follows with Dr. Carlis Abbott. May need left carotid procedure.    Past Medical History:  Diagnosis Date   Asthma    Dysrhythmia    Afib   GERD (gastroesophageal reflux disease)    Glaucoma    High cholesterol    History of cardiovascular stress test    Myoview 12/19:  EF 50%, No ischemia, Low Risk   Hypertension    Stroke [facility])    August 2022    Past Surgical History:  Procedure Laterality Date   ABDOMINAL AORTIC ANEURYSM REPAIR N/A 05/25/2019   Procedure: ANEURYSM ABDOMINAL AORTIC REPAIR using a HEMASHIELD GOLD VASCULAR GRAFT;  Surgeon: [name], MD;  Location: [place];  Service: Vascular;  Laterality: N/A;   APPENDECTOMY     EYE SURGERY Right    cataract   INGUINAL HERNIA REPAIR Right 06/15/2015   Procedure: RIGHT INGUINAL HERNIA REPAIR WITH MESH;  Surgeon: Jackolyn Confer, MD;  Location: [place];  Service: General;  Laterality: Right;   INSERTION OF MESH  Right 06/15/2015   Procedure: INSERTION OF MESH;  Surgeon: Jackolyn Confer, MD;  Location: [place];  Service: General;  Laterality: Right;   TRANSCAROTID ARTERY REVASCULARIZATION  Right 06/05/2021   Procedure: RIGHT TRANSCAROTID ARTERY REVASCULARIZATION;  Surgeon: Marty Heck, MD;  Location: [place];  Service: Vascular;  Laterality: Right;   VASCULAR SURGERY       Current Outpatient Medications  Medication Sig Dispense Refill   acetaminophen (TYLENOL) 325 MG tablet Take 2 tablets (650 mg total) by mouth every 4 (four) hours as needed for mild pain (or temp > 37.5 C (99.5 F)). 90 tablet    apixaban (ELIQUIS) 5 MG TABS tablet Take 1 tablet (5 mg total) by mouth 2 (two) times daily. 60 tablet 0   bimatoprost (LUMIGAN) 0.01 %  SOLN Place 1 drop into both eyes at bedtime.     cetirizine (ZYRTEC) 10 MG tablet Take 10 mg by mouth daily as needed.     clopidogrel (PLAVIX) 75 MG tablet Take 1 tablet (75 mg total) by mouth daily. 90 tablet 0   metoprolol succinate (TOPROL-XL) 50 MG 24 hr tablet Take 50 mg by mouth daily. Take with or immediately following a meal.     Omega-3 Fatty Acids (FISH OIL) 600 MG CAPS Take 600 mg by mouth daily.      atorvastatin (LIPITOR) 80 MG tablet Take 1 tablet (80 mg total) by mouth daily with supper. 30 tablet 0   cyclobenzaprine (FLEXERIL) 10 MG tablet Take 10 mg by mouth 2 (two) times daily as needed.     No current facility-administered medications for this visit.    Allergies:   Brinzolamide-brimonidine    Social History:  The patient  reports that he quit smoking about 54 years ago. His smoking use included cigarettes. He has never used smokeless tobacco. He reports that he does not drink alcohol and does not use drugs.   Family History:  The patient's family history includes Congestive Heart Failure in his mother; Heart attack (age of onset: 72) in his brother; Heart attack (age of onset: 19) in his mother; Heart failure (age of onset: 84) in his sister; Leukemia in his brother.    ROS:  Please see the history of present illness.   Otherwise, review of systems are positive for easy bruising.   All other systems are reviewed and negative.    PHYSICAL EXAM: VS:  BP (!) 172/96   Pulse (!) 51   Ht 6' (1.829 m)   Wt 190 lb (86.2 kg)   SpO2 96%   BMI 25.77 kg/m  , BMI Body mass index is 25.77 kg/m. GEN: Well nourished, well developed, in no acute distress HEENT: normal Neck: no JVD, carotid bruits, or masses Cardiac: irregularly irregular; no murmurs, rubs, or gallops,no edema  Respiratory:  clear to auscultation bilaterally, normal work of breathing GI: soft, nontender, nondistended, + BS MS: no deformity or atrophy Skin: warm and dry, no rash Neuro:  Strength and sensation  are intact Psych: euthymic mood, full affect   EKG:   The ekg ordered 9/23 demonstrates AFib, rate controlled   Recent Labs: 07/22/2022: ALT 19; BUN 19; Creatinine, Ser 1.01; Hemoglobin 15.4; Platelets 272; Potassium 4.4; Sodium 133   Lipid Panel    Component Value Date/Time   CHOL 91 (L) 07/22/2022 0908   TRIG 78 07/22/2022 0908   HDL 24 (L) 07/22/2022 0908   CHOLHDL 3.8 07/22/2022 0908   CHOLHDL 4.7 06/06/2021 0404   VLDL 16 06/06/2021 0404  [place] 51 07/22/2022 0908     Other studies Reviewed: Additional studies/ records that were reviewed today with results demonstrating: labs reviewed.   ASSESSMENT AND PLAN:  AFib: Prior CVA noted.  Rate controlled. Eliquis for stroke prevention.  Given his carotid disease, will continue Plavix for now.  He states he may need an intervention done.  After his carotids are fixed, may be able to address whether or not antiplatelet therapy needed in addition to Eliquis. AAA: s/p repair with Dr. Donnetta Hutching.  HTN: BPs better controlled at home.  Continue to check blood pressures at home. If there are readings consistently over 140/90,Let us know and we can adjust medications . Hyperlipidemia: LDL 51 in 9/23.  Whole food, Plant-based diet.  High-fiber diet. 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  This patients CHA2DS2-VASc Score and unadjusted Ischemic Stroke Rate (% per year) is equal to 3.2 % stroke rate/year from a score of 3  Above score calculated as 1 point each if present [CHF, HTN, DM, Vascular=MI/PAD/Aortic Plaque, Age if 65-74, or Male] Above score calculated as 2 points each if present [Age > 75, or Stroke/TIA/TE]     Current medicines are reviewed at length with the patient today.  The patient concerns regarding his medicines were addressed.  The following changes have been made:  No change  Labs/ tests ordered today include:  No orders of the defined types were placed in this encounter.   Recommend 150 minutes/week of aerobic exercise Low  fat, low carb, high fiber diet recommended  Disposition:   FU in 6 months   Signed, Larae Grooms, MD  01/09/2023 10:22 AM    [facility] [place], [place],   [address] Phone: [phone]; Fax: [phone]

## 2023-01-09 NOTE — Patient Instructions (Signed)
Medication Instructions:  Your physician recommends that you continue on your current medications as directed. Please refer to the Current Medication list given to you today.  *If you need a refill on your cardiac medications before your next appointment, please call your pharmacy*   Lab Work: none If you have labs (blood work) drawn today and your tests are completely normal, you will receive your results only by: [place] (if you have MyChart) OR A paper copy in the mail If you have any lab test that is abnormal or we need to change your treatment, we will call you to review the results.   Testing/Procedures: none   Follow-Up: At [facility], you and your health needs are our priority.  As part of our continuing mission to provide you with exceptional heart care, we have created designated Provider Care Teams.  These Care Teams include your primary Cardiologist (physician) and Advanced Practice Providers (APPs -  Physician Assistants and Nurse Practitioners) who all work together to provide you with the care you need, when you need it.  We recommend signing up for the patient portal called "MyChart".  Sign up information is provided on this After Visit Summary.  MyChart is used to connect with patients for Virtual Visits (Telemedicine).  Patients are able to view lab/test results, encounter notes, upcoming appointments, etc.  Non-urgent messages can be sent to your provider as well.   To learn more about what you can do with MyChart, go to [email or web address].    Your next appointment:   6 month(s)  Provider:   Richardson Dopp, PA-C         Other Instructions

## 2023-02-04 ENCOUNTER — Telehealth: Payer: Self-pay | Admitting: Interventional Cardiology

## 2023-02-04 NOTE — Telephone Encounter (Signed)
Pt was returning nurse call in regards to results and would like a callback. Please advise.  

## 2023-02-04 NOTE — Telephone Encounter (Signed)
Patient is aware of lab results. He is aware he needs to contact PCP regarding A1C. Verbalized understanding.

## 2023-10-01 ENCOUNTER — Other Ambulatory Visit: Payer: Self-pay

## 2023-10-01 DIAGNOSIS — I6521 Occlusion and stenosis of right carotid artery: Secondary | ICD-10-CM

## 2023-10-01 DIAGNOSIS — I6523 Occlusion and stenosis of bilateral carotid arteries: Secondary | ICD-10-CM

## 2023-11-23 NOTE — Progress Notes (Unsigned)
Patient name: Tyler Burns MRN: [id number] DOB: Apr 05, 1956 Sex: male  REASON FOR CONSULT: Carotid artery follow-up, 1 year  HPI: Tyler Burns is a 68 y.o. male, with hx atrial fibrillation on Eliquis, CKD, and carotid artery disease that presents for 1 year follow-up of his carotid artery disease.  He previously underwent a right TCAR for high-grade symptomatic stenosis on 06/05/2021.  He reports no neurologic events since last follow-up.  Remains on Plavix.  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 Arbie Cookey with a 16 x 8 Hemashield graft from the aorta to bilateral common iliac arteries.  Past Medical History:  Diagnosis Date   Asthma    Dysrhythmia    Afib   GERD (gastroesophageal reflux disease)    Glaucoma    High cholesterol    History of cardiovascular stress test    Myoview 12/19:  EF 50%, No ischemia, Low Risk   Hypertension    Stroke [facility])    August 2022    Past Surgical History:  Procedure Laterality Date   ABDOMINAL AORTIC ANEURYSM REPAIR N/A 05/25/2019   Procedure: ANEURYSM ABDOMINAL AORTIC REPAIR using a HEMASHIELD GOLD VASCULAR GRAFT;  Surgeon: Larina Earthly, MD;  Location: [facility] OR;  Service: Vascular;  Laterality: N/A;   APPENDECTOMY     EYE SURGERY Right    cataract   INGUINAL HERNIA REPAIR Right 06/15/2015   Procedure: RIGHT INGUINAL HERNIA REPAIR WITH MESH;  Surgeon: Avel Peace, MD;  Location: [facility] OR;  Service: General;  Laterality: Right;   INSERTION OF MESH Right 06/15/2015   Procedure: INSERTION OF MESH;  Surgeon: Avel Peace, MD;  Location: [facility] OR;  Service: General;  Laterality: Right;   TRANSCAROTID ARTERY REVASCULARIZATION  Right 06/05/2021   Procedure: RIGHT TRANSCAROTID ARTERY REVASCULARIZATION;  Surgeon: Cephus Shelling, MD;  Location: [facility] OR;  Service: Vascular;  Laterality: Right;   VASCULAR SURGERY      Family History  Problem Relation Age of Onset   Congestive Heart Failure Mother    Heart  attack Mother 29   Heart attack Brother 4   Leukemia Brother    Heart failure Sister 63   Stroke Neg Hx     SOCIAL HISTORY: Social History   Socioeconomic History   Marital status: Divorced    Spouse name: Not on file   Number of children: Not on file   Years of education: Not on file   Highest education level: Not on file  Occupational History   Not on file  Tobacco Use   Smoking status: Former    Current packs/day: 0.00    Types: Cigarettes    Quit date: 1970    Years since quitting: 55.1   Smokeless tobacco: Never  Vaping Use   Vaping status: Never Used  Substance and Sexual Activity   Alcohol use: No   Drug use: No   Sexual activity: Not on file  Other Topics Concern   Not on file  Social History Narrative   Not on file   Social Drivers of Health   Financial Resource Strain: Not on file  Food Insecurity: Not on file  Transportation Needs: Not on file  Physical Activity: Not on file  Stress: Not on file  Social Connections: Not on file  Intimate Partner Violence: Not on file    Allergies  Allergen Reactions   Brinzolamide-Brimonidine Shortness Of Breath    Feels he has difficulty breathing when on medication Eye drop medication  Current Outpatient Medications  Medication Sig Dispense Refill   acetaminophen (TYLENOL) 325 MG tablet Take 2 tablets (650 mg total) by mouth every 4 (four) hours as needed for mild pain (or temp > 37.5 C (99.5 F)). 90 tablet    apixaban (ELIQUIS) 5 MG TABS tablet Take 1 tablet (5 mg total) by mouth 2 (two) times daily. 60 tablet 0   atorvastatin (LIPITOR) 80 MG tablet Take 1 tablet (80 mg total) by mouth daily with supper. 30 tablet 0   bimatoprost (LUMIGAN) 0.01 % SOLN Place 1 drop into both eyes at bedtime.     cetirizine (ZYRTEC) 10 MG tablet Take 10 mg by mouth daily as needed.     clopidogrel (PLAVIX) 75 MG tablet Take 1 tablet (75 mg total) by mouth daily. 90 tablet 0   cyclobenzaprine (FLEXERIL) 10 MG tablet Take 10  mg by mouth 2 (two) times daily as needed.     metoprolol succinate (TOPROL-XL) 50 MG 24 hr tablet Take 50 mg by mouth daily. Take with or immediately following a meal.     Omega-3 Fatty Acids (FISH OIL) 600 MG CAPS Take 600 mg by mouth daily.      No current facility-administered medications for this visit.    REVIEW OF SYSTEMS:  [X]  denotes positive finding, [ ]  denotes negative finding Cardiac  Comments:  Chest pain or chest pressure:    Shortness of breath upon exertion:    Short of breath when lying flat:    Irregular heart rhythm:        Vascular    Pain in calf, thigh, or hip brought on by ambulation:    Pain in feet at night that wakes you up from your sleep:     Blood clot in your veins:    Leg swelling:         Pulmonary    Oxygen at home:    Productive cough:     Wheezing:         Neurologic    Sudden weakness in arms or legs:     Sudden numbness in arms or legs:     Sudden onset of difficulty speaking or slurred speech:    Temporary loss of vision in one eye:     Problems with dizziness:         Gastrointestinal    Blood in stool:     Vomited blood:         Genitourinary    Burning when urinating:     Blood in urine:        Psychiatric    Major depression:         Hematologic    Bleeding problems:    Problems with blood clotting too easily:        Skin    Rashes or ulcers:        Constitutional    Fever or chills:      PHYSICAL EXAM: There were no vitals filed for this visit.   GENERAL: The patient is a well-nourished male, in no acute distress. The vital signs are documented above. CARDIAC: There is a regular rate and rhythm.  VASCULAR:  Right neck incision well-healed PULMONARY: No respiratory distress. ABDOMEN: Soft and non-tender. MUSCULOSKELETAL: There are no major deformities or cyanosis. NEUROLOGIC: No focal weakness or paresthesias are detected.  Renal nerves II through XII grossly intact SKIN: There are no ulcers or rashes  noted. PSYCHIATRIC: The patient has a normal affect.  DATA:  Carotid duplex today shows widely patent right carotid stent with 40 to 59% contralateral left ICA stenosis  Assessment/Plan:  68 year old male presents for surveillance after right TCAR for high-grade symptomatic carotid stenosis on 06/05/2021.  Discussed his right carotid stent is widely patent on duplex today.  He has 40 to 59% left ICA stenosis that is asymptomatic.  Discussed for asymptomatic disease does not require intervention unless more than 80% stenosis.  He has had a previous open juxtarenal AAA repair by Dr. Arbie Cookey in 2020 and I discussed would need a surveillance CT at 5 years.  He is on Plavix from our standpoint.   Cephus Shelling, MD [facility] Office: [phone]

## 2023-11-24 ENCOUNTER — Ambulatory Visit: Payer: Medicare Other | Admitting: Vascular Surgery

## 2023-11-24 ENCOUNTER — Ambulatory Visit (HOSPITAL_COMMUNITY)
Admission: RE | Admit: 2023-11-24 | Discharge: 2023-11-24 | Disposition: A | Discharge status: Home / Self Care | Payer: Medicare Other | Source: Ambulatory Visit | Attending: Vascular Surgery | Admitting: Vascular Surgery

## 2023-11-24 ENCOUNTER — Encounter: Payer: Self-pay | Admitting: Vascular Surgery

## 2023-11-24 VITALS — BP 159/97 | HR 62 | Temp 98.3°F | Resp 18 | Ht 72.0 in | Wt 186.6 lb

## 2023-11-24 DIAGNOSIS — I6521 Occlusion and stenosis of right carotid artery: Secondary | ICD-10-CM | POA: Insufficient documentation

## 2023-11-24 DIAGNOSIS — I6523 Occlusion and stenosis of bilateral carotid arteries: Secondary | ICD-10-CM | POA: Insufficient documentation

## 2023-11-24 DIAGNOSIS — I7142 Juxtarenal abdominal aortic aneurysm, without rupture: Secondary | ICD-10-CM | POA: Diagnosis not present

## 2023-12-10 ENCOUNTER — Other Ambulatory Visit: Payer: Self-pay

## 2023-12-10 DIAGNOSIS — I6523 Occlusion and stenosis of bilateral carotid arteries: Secondary | ICD-10-CM

## 2024-03-24 ENCOUNTER — Inpatient Hospital Stay (HOSPITAL_COMMUNITY)
Admission: EM | Admit: 2024-03-24 | Discharge: 2024-03-26 | DRG: 208 | Disposition: A | Discharge status: Home / Self Care | Attending: Pulmonary Disease | Admitting: Pulmonary Disease

## 2024-03-24 DIAGNOSIS — Z8249 Family history of ischemic heart disease and other diseases of the circulatory system: Secondary | ICD-10-CM

## 2024-03-24 DIAGNOSIS — G9340 Encephalopathy, unspecified: Principal | ICD-10-CM

## 2024-03-24 DIAGNOSIS — Z806 Family history of leukemia: Secondary | ICD-10-CM

## 2024-03-24 DIAGNOSIS — J9601 Acute respiratory failure with hypoxia: Principal | ICD-10-CM | POA: Diagnosis present

## 2024-03-24 DIAGNOSIS — Z7901 Long term (current) use of anticoagulants: Secondary | ICD-10-CM

## 2024-03-24 DIAGNOSIS — I161 Hypertensive emergency: Secondary | ICD-10-CM | POA: Diagnosis present

## 2024-03-24 DIAGNOSIS — H409 Unspecified glaucoma: Secondary | ICD-10-CM | POA: Diagnosis present

## 2024-03-24 DIAGNOSIS — G928 Other toxic encephalopathy: Principal | ICD-10-CM | POA: Diagnosis present

## 2024-03-24 DIAGNOSIS — I6503 Occlusion and stenosis of bilateral vertebral arteries: Secondary | ICD-10-CM | POA: Diagnosis present

## 2024-03-24 DIAGNOSIS — Z888 Allergy status to other drugs, medicaments and biological substances status: Secondary | ICD-10-CM

## 2024-03-24 DIAGNOSIS — N179 Acute kidney failure, unspecified: Secondary | ICD-10-CM | POA: Diagnosis not present

## 2024-03-24 DIAGNOSIS — T424X5A Adverse effect of benzodiazepines, initial encounter: Secondary | ICD-10-CM | POA: Diagnosis present

## 2024-03-24 DIAGNOSIS — Z79899 Other long term (current) drug therapy: Secondary | ICD-10-CM

## 2024-03-24 DIAGNOSIS — F1290 Cannabis use, unspecified, uncomplicated: Secondary | ICD-10-CM | POA: Diagnosis present

## 2024-03-24 DIAGNOSIS — J45909 Unspecified asthma, uncomplicated: Secondary | ICD-10-CM | POA: Diagnosis present

## 2024-03-24 DIAGNOSIS — Y900 Blood alcohol level of less than 20 mg/100 ml: Secondary | ICD-10-CM | POA: Diagnosis present

## 2024-03-24 DIAGNOSIS — D751 Secondary polycythemia: Secondary | ICD-10-CM | POA: Diagnosis present

## 2024-03-24 DIAGNOSIS — Z7902 Long term (current) use of antithrombotics/antiplatelets: Secondary | ICD-10-CM

## 2024-03-24 DIAGNOSIS — F1090 Alcohol use, unspecified, uncomplicated: Secondary | ICD-10-CM | POA: Diagnosis present

## 2024-03-24 DIAGNOSIS — R4182 Altered mental status, unspecified: Secondary | ICD-10-CM | POA: Diagnosis not present

## 2024-03-24 DIAGNOSIS — I4891 Unspecified atrial fibrillation: Secondary | ICD-10-CM | POA: Diagnosis present

## 2024-03-24 DIAGNOSIS — E7800 Pure hypercholesterolemia, unspecified: Secondary | ICD-10-CM | POA: Diagnosis present

## 2024-03-24 DIAGNOSIS — Z87891 Personal history of nicotine dependence: Secondary | ICD-10-CM

## 2024-03-24 DIAGNOSIS — Z8673 Personal history of transient ischemic attack (TIA), and cerebral infarction without residual deficits: Secondary | ICD-10-CM

## 2024-03-24 DIAGNOSIS — K219 Gastro-esophageal reflux disease without esophagitis: Secondary | ICD-10-CM | POA: Diagnosis present

## 2024-03-24 DIAGNOSIS — I10 Essential (primary) hypertension: Secondary | ICD-10-CM | POA: Diagnosis present

## 2024-03-24 MED ORDER — SODIUM CHLORIDE 0.9 % IV BOLUS
1000.0000 mL | Freq: Once | INTRAVENOUS | Status: AC
  Administered 2024-03-25: 1000 mL via INTRAVENOUS

## 2024-03-24 MED ORDER — LORAZEPAM 2 MG/ML IJ SOLN
1.0000 mg | Freq: Once | INTRAMUSCULAR | Status: AC
  Administered 2024-03-25: 1 mg via INTRAVENOUS
  Filled 2024-03-24: qty 1

## 2024-03-24 NOTE — ED Triage Notes (Signed)
 Pt BIB [facility] from home with GF.   Gf left for work around 2 pm. Pt per normal at that time with no complaint. When she returned at 2249  she called EMS after finding him confused. Hx of stroke and THC use.

## 2024-03-25 ENCOUNTER — Emergency Department (HOSPITAL_COMMUNITY)

## 2024-03-25 ENCOUNTER — Other Ambulatory Visit (HOSPITAL_COMMUNITY)

## 2024-03-25 ENCOUNTER — Inpatient Hospital Stay (HOSPITAL_COMMUNITY)

## 2024-03-25 DIAGNOSIS — Z8673 Personal history of transient ischemic attack (TIA), and cerebral infarction without residual deficits: Secondary | ICD-10-CM | POA: Diagnosis not present

## 2024-03-25 DIAGNOSIS — Z888 Allergy status to other drugs, medicaments and biological substances status: Secondary | ICD-10-CM | POA: Diagnosis not present

## 2024-03-25 DIAGNOSIS — I161 Hypertensive emergency: Secondary | ICD-10-CM

## 2024-03-25 DIAGNOSIS — J45909 Unspecified asthma, uncomplicated: Secondary | ICD-10-CM | POA: Diagnosis present

## 2024-03-25 DIAGNOSIS — Z7901 Long term (current) use of anticoagulants: Secondary | ICD-10-CM | POA: Diagnosis not present

## 2024-03-25 DIAGNOSIS — T424X5A Adverse effect of benzodiazepines, initial encounter: Secondary | ICD-10-CM | POA: Diagnosis present

## 2024-03-25 DIAGNOSIS — H409 Unspecified glaucoma: Secondary | ICD-10-CM | POA: Diagnosis present

## 2024-03-25 DIAGNOSIS — D751 Secondary polycythemia: Secondary | ICD-10-CM | POA: Diagnosis present

## 2024-03-25 DIAGNOSIS — Y900 Blood alcohol level of less than 20 mg/100 ml: Secondary | ICD-10-CM | POA: Diagnosis present

## 2024-03-25 DIAGNOSIS — I6503 Occlusion and stenosis of bilateral vertebral arteries: Secondary | ICD-10-CM | POA: Diagnosis present

## 2024-03-25 DIAGNOSIS — Z87891 Personal history of nicotine dependence: Secondary | ICD-10-CM | POA: Diagnosis not present

## 2024-03-25 DIAGNOSIS — K219 Gastro-esophageal reflux disease without esophagitis: Secondary | ICD-10-CM | POA: Diagnosis present

## 2024-03-25 DIAGNOSIS — I4891 Unspecified atrial fibrillation: Secondary | ICD-10-CM | POA: Diagnosis present

## 2024-03-25 DIAGNOSIS — R4182 Altered mental status, unspecified: Secondary | ICD-10-CM | POA: Diagnosis present

## 2024-03-25 DIAGNOSIS — Z806 Family history of leukemia: Secondary | ICD-10-CM | POA: Diagnosis not present

## 2024-03-25 DIAGNOSIS — Z79899 Other long term (current) drug therapy: Secondary | ICD-10-CM | POA: Diagnosis not present

## 2024-03-25 DIAGNOSIS — G9340 Encephalopathy, unspecified: Secondary | ICD-10-CM | POA: Diagnosis not present

## 2024-03-25 DIAGNOSIS — N179 Acute kidney failure, unspecified: Secondary | ICD-10-CM | POA: Diagnosis not present

## 2024-03-25 DIAGNOSIS — Z7902 Long term (current) use of antithrombotics/antiplatelets: Secondary | ICD-10-CM | POA: Diagnosis not present

## 2024-03-25 DIAGNOSIS — F1090 Alcohol use, unspecified, uncomplicated: Secondary | ICD-10-CM | POA: Diagnosis present

## 2024-03-25 DIAGNOSIS — I10 Essential (primary) hypertension: Secondary | ICD-10-CM | POA: Diagnosis present

## 2024-03-25 DIAGNOSIS — F1290 Cannabis use, unspecified, uncomplicated: Secondary | ICD-10-CM | POA: Diagnosis present

## 2024-03-25 DIAGNOSIS — J9601 Acute respiratory failure with hypoxia: Principal | ICD-10-CM

## 2024-03-25 DIAGNOSIS — I6501 Occlusion and stenosis of right vertebral artery: Secondary | ICD-10-CM

## 2024-03-25 DIAGNOSIS — Z8249 Family history of ischemic heart disease and other diseases of the circulatory system: Secondary | ICD-10-CM | POA: Diagnosis not present

## 2024-03-25 DIAGNOSIS — E7800 Pure hypercholesterolemia, unspecified: Secondary | ICD-10-CM | POA: Diagnosis present

## 2024-03-25 DIAGNOSIS — G928 Other toxic encephalopathy: Secondary | ICD-10-CM | POA: Diagnosis present

## 2024-03-25 LAB — I-STAT ARTERIAL BLOOD GAS, ED
Acid-base deficit: 3 mmol/L — ABNORMAL HIGH (ref 0.0–2.0)
Bicarbonate: 22.9 mmol/L (ref 20.0–28.0)
Calcium, Ion: 1.19 mmol/L (ref 1.15–1.40)
HCT: 52 % (ref 39.0–52.0)
Hemoglobin: 17.7 g/dL — ABNORMAL HIGH (ref 13.0–17.0)
O2 Saturation: 99 %
Patient temperature: 97
Potassium: 3.2 mmol/L — ABNORMAL LOW (ref 3.5–5.1)
Sodium: 138 mmol/L (ref 135–145)
TCO2: 24 mmol/L (ref 22–32)
pCO2 arterial: 42.7 mmHg (ref 32–48)
pH, Arterial: 7.333 — ABNORMAL LOW (ref 7.35–7.45)
pO2, Arterial: 156 mmHg — ABNORMAL HIGH (ref 83–108)

## 2024-03-25 LAB — COMPREHENSIVE METABOLIC PANEL WITH GFR
ALT: 16 U/L (ref 0–44)
AST: 22 U/L (ref 15–41)
Albumin: 4.6 g/dL (ref 3.5–5.0)
Alkaline Phosphatase: 54 U/L (ref 38–126)
Anion gap: 17 — ABNORMAL HIGH (ref 5–15)
BUN: 15 mg/dL (ref 8–23)
CO2: 22 mmol/L (ref 22–32)
Calcium: 9.9 mg/dL (ref 8.9–10.3)
Chloride: 100 mmol/L (ref 98–111)
Creatinine, Ser: 1.03 mg/dL (ref 0.61–1.24)
GFR, Estimated: >60 mL/min (ref >60)
Glucose, Bld: 106 mg/dL — ABNORMAL HIGH (ref 70–99)
Potassium: 3.7 mmol/L (ref 3.5–5.1)
Sodium: 139 mmol/L (ref 135–145)
Total Bilirubin: 0.9 mg/dL (ref 0.0–1.2)
Total Protein: 8.1 g/dL (ref 6.5–8.1)

## 2024-03-25 LAB — CBC
HCT: 52.9 % — ABNORMAL HIGH (ref 39.0–52.0)
HCT: 55.3 % — ABNORMAL HIGH (ref 39.0–52.0)
Hemoglobin: 17.7 g/dL — ABNORMAL HIGH (ref 13.0–17.0)
Hemoglobin: 18.7 g/dL — ABNORMAL HIGH (ref 13.0–17.0)
MCH: 31.1 pg (ref 26.0–34.0)
MCH: 31.2 pg (ref 26.0–34.0)
MCHC: 33.5 g/dL (ref 30.0–36.0)
MCHC: 33.8 g/dL (ref 30.0–36.0)
MCV: 92.3 fL (ref 80.0–100.0)
MCV: 93 fL (ref 80.0–100.0)
Platelets: 189 10*3/uL (ref 150–400)
Platelets: 194 10*3/uL (ref 150–400)
RBC: 5.69 MIL/uL (ref 4.22–5.81)
RBC: 5.99 MIL/uL — ABNORMAL HIGH (ref 4.22–5.81)
RDW: 13.7 % (ref 11.5–15.5)
RDW: 13.9 % (ref 11.5–15.5)
WBC: 14.2 10*3/uL — ABNORMAL HIGH (ref 4.0–10.5)
WBC: 9.5 10*3/uL (ref 4.0–10.5)
nRBC: 0 % (ref 0.0–0.2)
nRBC: 0 % (ref 0.0–0.2)

## 2024-03-25 LAB — TROPONIN I (HIGH SENSITIVITY)
Troponin I (High Sensitivity): 21 ng/L — ABNORMAL HIGH (ref <18)
Troponin I (High Sensitivity): 39 ng/L — ABNORMAL HIGH (ref <18)

## 2024-03-25 LAB — CREATININE, SERUM
Creatinine, Ser: 1.27 mg/dL — ABNORMAL HIGH (ref 0.61–1.24)
GFR, Estimated: >60 mL/min (ref >60)

## 2024-03-25 LAB — CBG MONITORING, ED
Glucose-Capillary: 117 mg/dL — ABNORMAL HIGH (ref 70–99)
Glucose-Capillary: 133 mg/dL — ABNORMAL HIGH (ref 70–99)

## 2024-03-25 LAB — URINALYSIS, ROUTINE W REFLEX MICROSCOPIC
Bacteria, UA: NONE SEEN
Bilirubin Urine: NEGATIVE
Glucose, UA: NEGATIVE mg/dL
Ketones, ur: NEGATIVE mg/dL
Leukocytes,Ua: NEGATIVE
Nitrite: NEGATIVE
Protein, ur: 100 mg/dL — AB
Specific Gravity, Urine: 1.005 (ref 1.005–1.030)
pH: 7 (ref 5.0–8.0)

## 2024-03-25 LAB — RAPID URINE DRUG SCREEN, HOSP PERFORMED
Amphetamines: NOT DETECTED
Barbiturates: NOT DETECTED
Benzodiazepines: NOT DETECTED
Cocaine: NOT DETECTED
Opiates: NOT DETECTED
Tetrahydrocannabinol: POSITIVE — AB

## 2024-03-25 LAB — AMMONIA: Ammonia: 19 umol/L (ref 9–35)

## 2024-03-25 LAB — ETHANOL: Alcohol, Ethyl (B): 19 mg/dL — ABNORMAL HIGH (ref <15)

## 2024-03-25 LAB — HIV ANTIBODY (ROUTINE TESTING W REFLEX): HIV Screen 4th Generation wRfx: NONREACTIVE

## 2024-03-25 LAB — GLUCOSE, CAPILLARY
Glucose-Capillary: 128 mg/dL — ABNORMAL HIGH (ref 70–99)
Glucose-Capillary: 92 mg/dL (ref 70–99)
Glucose-Capillary: 92 mg/dL (ref 70–99)
Glucose-Capillary: 95 mg/dL (ref 70–99)

## 2024-03-25 LAB — MAGNESIUM: Magnesium: 2.1 mg/dL (ref 1.7–2.4)

## 2024-03-25 LAB — MRSA NEXT GEN BY PCR, NASAL: MRSA by PCR Next Gen: NOT DETECTED

## 2024-03-25 LAB — PROTIME-INR
INR: 1.1 (ref 0.8–1.2)
Prothrombin Time: 14 s (ref 11.4–15.2)

## 2024-03-25 LAB — ACETAMINOPHEN LEVEL: Acetaminophen (Tylenol), Serum: <10 ug/mL — ABNORMAL LOW (ref 10–30)

## 2024-03-25 LAB — HEMOGLOBIN A1C
Hgb A1c MFr Bld: 5.6 % (ref 4.8–5.6)
Mean Plasma Glucose: 114.02 mg/dL

## 2024-03-25 LAB — SALICYLATE LEVEL: Salicylate Lvl: <7 mg/dL — ABNORMAL LOW (ref 7.0–30.0)

## 2024-03-25 LAB — ECHOCARDIOGRAM COMPLETE
Height: 71 in
S' Lateral: 3.1 cm
Weight: 2984.15 [oz_av]

## 2024-03-25 MED ORDER — ROCURONIUM BROMIDE 10 MG/ML (PF) SYRINGE
PREFILLED_SYRINGE | INTRAVENOUS | Status: AC | PRN
  Administered 2024-03-25: 80 mg via INTRAVENOUS

## 2024-03-25 MED ORDER — LORAZEPAM 2 MG/ML IJ SOLN
1.0000 mg | Freq: Once | INTRAMUSCULAR | Status: AC
  Administered 2024-03-25: 1 mg via INTRAVENOUS
  Filled 2024-03-25: qty 1

## 2024-03-25 MED ORDER — FENTANYL 2500MCG IN NS 250ML (10MCG/ML) PREMIX INFUSION
0.0000 ug/h | INTRAVENOUS | Status: DC
  Administered 2024-03-25: 25 ug/h via INTRAVENOUS
  Filled 2024-03-25: qty 250

## 2024-03-25 MED ORDER — POTASSIUM CHLORIDE 20 MEQ PO PACK
40.0000 meq | PACK | ORAL | Status: AC
  Administered 2024-03-25 (×2): 40 meq
  Filled 2024-03-25 (×2): qty 2

## 2024-03-25 MED ORDER — CLEVIDIPINE BUTYRATE 0.5 MG/ML IV EMUL: 0.0000 mg/h | INTRAVENOUS | Status: DC

## 2024-03-25 MED ORDER — PROPOFOL 1000 MG/100ML IV EMUL
INTRAVENOUS | Status: AC
  Filled 2024-03-25: qty 100

## 2024-03-25 MED ORDER — FAMOTIDINE 20 MG PO TABS: 20.0000 mg | ORAL_TABLET | Freq: Two times a day (BID) | ORAL | Status: DC

## 2024-03-25 MED ORDER — SODIUM CHLORIDE 0.9 % IV BOLUS
1000.0000 mL | Freq: Once | INTRAVENOUS | Status: AC
  Administered 2024-03-25: 1000 mL via INTRAVENOUS

## 2024-03-25 MED ORDER — DOCUSATE SODIUM 50 MG/5ML PO LIQD
100.0000 mg | Freq: Two times a day (BID) | ORAL | Status: DC | PRN
Start: 2024-03-25 — End: 2024-03-26

## 2024-03-25 MED ORDER — FAMOTIDINE 20 MG PO TABS
20.0000 mg | ORAL_TABLET | Freq: Two times a day (BID) | ORAL | Status: DC
  Administered 2024-03-25: 20 mg
  Filled 2024-03-25: qty 1

## 2024-03-25 MED ORDER — ORAL CARE MOUTH RINSE
15.0000 mL | OROMUCOSAL | Status: DC
  Administered 2024-03-25 (×4): 15 mL via OROMUCOSAL

## 2024-03-25 MED ORDER — POLYETHYLENE GLYCOL 3350 17 G PO PACK: 17.0000 g | PACK | Freq: Every day | ORAL | Status: DC | PRN

## 2024-03-25 MED ORDER — PROPOFOL 1000 MG/100ML IV EMUL
0.0000 ug/kg/min | INTRAVENOUS | Status: DC
  Administered 2024-03-25: 20 ug/kg/min via INTRAVENOUS

## 2024-03-25 MED ORDER — INSULIN ASPART 100 UNIT/ML IJ SOLN: 0.0000 [IU] | INTRAMUSCULAR | Status: DC

## 2024-03-25 MED ORDER — DOCUSATE SODIUM 50 MG/5ML PO LIQD: 100.0000 mg | Freq: Two times a day (BID) | ORAL | Status: DC | PRN

## 2024-03-25 MED ORDER — IOHEXOL 350 MG/ML SOLN
75.0000 mL | Freq: Once | INTRAVENOUS | Status: AC | PRN
  Administered 2024-03-25: 75 mL via INTRAVENOUS

## 2024-03-25 MED ORDER — CHLORHEXIDINE GLUCONATE CLOTH 2 % EX PADS
6.0000 | MEDICATED_PAD | Freq: Every day | CUTANEOUS | Status: DC
  Administered 2024-03-25: 6 via TOPICAL

## 2024-03-25 MED ORDER — LABETALOL HCL 5 MG/ML IV SOLN
10.0000 mg | Freq: Once | INTRAVENOUS | Status: AC
  Administered 2024-03-25: 10 mg via INTRAVENOUS
  Filled 2024-03-25: qty 4

## 2024-03-25 MED ORDER — APIXABAN 5 MG PO TABS
5.0000 mg | ORAL_TABLET | Freq: Two times a day (BID) | ORAL | Status: DC
  Administered 2024-03-25 – 2024-03-26 (×2): 5 mg via ORAL
  Filled 2024-03-25 (×2): qty 1

## 2024-03-25 MED ORDER — PROPOFOL 1000 MG/100ML IV EMUL
0.0000 ug/kg/min | INTRAVENOUS | Status: DC
  Administered 2024-03-25: 25 ug/kg/min via INTRAVENOUS
  Filled 2024-03-25 (×2): qty 100

## 2024-03-25 MED ORDER — HEPARIN SODIUM (PORCINE) 5000 UNIT/ML IJ SOLN
5000.0000 [IU] | Freq: Three times a day (TID) | INTRAMUSCULAR | Status: DC
  Administered 2024-03-25: 5000 [IU] via SUBCUTANEOUS
  Filled 2024-03-25: qty 1

## 2024-03-25 MED ORDER — FAMOTIDINE 20 MG PO TABS
20.0000 mg | ORAL_TABLET | Freq: Two times a day (BID) | ORAL | Status: DC
  Filled 2024-03-25: qty 1

## 2024-03-25 MED ORDER — CLOPIDOGREL BISULFATE 75 MG PO TABS
75.0000 mg | ORAL_TABLET | Freq: Every day | ORAL | Status: DC
  Administered 2024-03-25 – 2024-03-26 (×2): 75 mg via ORAL
  Filled 2024-03-25 (×2): qty 1

## 2024-03-25 MED ORDER — ORAL CARE MOUTH RINSE
15.0000 mL | OROMUCOSAL | Status: DC | PRN
Start: 2024-03-25 — End: 2024-03-26

## 2024-03-25 MED ORDER — POLYETHYLENE GLYCOL 3350 17 G PO PACK
17.0000 g | PACK | Freq: Every day | ORAL | Status: DC | PRN
Start: 2024-03-25 — End: 2024-03-26

## 2024-03-25 MED ORDER — ETOMIDATE 2 MG/ML IV SOLN
INTRAVENOUS | Status: AC | PRN
  Administered 2024-03-25: 20 mg via INTRAVENOUS

## 2024-03-25 MED ORDER — METOPROLOL SUCCINATE ER 50 MG PO TB24
50.0000 mg | ORAL_TABLET | Freq: Every day | ORAL | Status: DC
  Administered 2024-03-25 – 2024-03-26 (×2): 50 mg via ORAL
  Filled 2024-03-25 (×2): qty 1

## 2024-03-25 NOTE — Progress Notes (Signed)
 Pharmacy Electrolyte Replacement  Recent Labs:  Recent Labs    03/25/24 0302 03/25/24 0609  K 3.2*  --   CREATININE  --  1.27*    Low Critical Values (K </= 2.5, Phos </= 1, Mg </= 1) Present: None  MD Contacted: n/a  Plan: KCl 40 mEq per tube x1.  Will check Mag level. Recheck K/Mg with AM labs.   Lenard Quam, PharmD, BCPS, BCCCP Please refer to [facility] for [facility] Pharmacy numbers 03/25/2024, 10:16 AM

## 2024-03-25 NOTE — ED Notes (Signed)
 ED TO INPATIENT HANDOFF REPORT  ED Nurse Name and Phone #: [phone]  S Name/Age/Gender Tyler Burns 68 y.o. male Room/Bed: TRACC/TRACC  Code Status   Code Status: Full Code  Home/SNF/Other Home Disoriented x 4, incomprehensible speech, not following commands. Typically A&Ox4  Is this baseline? No   Triage Complete: Triage complete  Chief Complaint Hypertensive emergency [I16.1]  Triage Note Pt BIB [facility] from home with GF.   Gf left for work around 2 pm. Pt per normal at that time with no complaint. When she returned at 2249  she called EMS after finding him confused. Hx of stroke and THC use.    Allergies Allergies  Allergen Reactions   Brinzolamide -Brimonidine  Shortness Of Breath    Feels he has difficulty breathing when on medication Eye drop medication    Level of Care/Admitting Diagnosis ED Disposition     ED Disposition  Admit   Condition  --   Comment  Hospital Area: [facility] [[id number]]  Level of Care: ICU [6]  May admit patient to [facility] or [facility] if equivalent level of care is available:: Yes  Covid Evaluation: Asymptomatic - no recent exposure (last 10 days) testing not required  Diagnosis: Hypertensive emergency [phone]  Admitting Physician: Lezlie Reddish [[id number]]  Attending Physician: Lezlie Reddish [[id number]]  Certification:: I certify this patient will need inpatient services for at least 2 midnights  Expected Medical Readiness: 03/29/2024          B Medical/Surgery History Past Medical History:  Diagnosis Date   Asthma    Carotid artery occlusion    Dysrhythmia    Afib   GERD (gastroesophageal reflux disease)    Glaucoma    High cholesterol    History of cardiovascular stress test    Myoview  12/19:  EF 50%, No ischemia, Low Risk   Hypertension    Stroke [facility])    August 2022   Past Surgical History:  Procedure Laterality Date   ABDOMINAL AORTIC ANEURYSM REPAIR N/A 05/25/2019   Procedure:  ANEURYSM ABDOMINAL AORTIC REPAIR using a HEMASHIELD GOLD VASCULAR GRAFT;  Surgeon: Mayo Speck, MD;  Location: [facility] OR;  Service: Vascular;  Laterality: N/A;   APPENDECTOMY     EYE SURGERY Right    cataract   INGUINAL HERNIA REPAIR Right 06/15/2015   Procedure: RIGHT INGUINAL HERNIA REPAIR WITH MESH;  Surgeon: Adalberto Hollow, MD;  Location: [facility] OR;  Service: General;  Laterality: Right;   INSERTION OF MESH Right 06/15/2015   Procedure: INSERTION OF MESH;  Surgeon: Adalberto Hollow, MD;  Location: [facility] OR;  Service: General;  Laterality: Right;   TRANSCAROTID ARTERY REVASCULARIZATION  Right 06/05/2021   Procedure: RIGHT TRANSCAROTID ARTERY REVASCULARIZATION;  Surgeon: Young Hensen, MD;  Location: [facility] OR;  Service: Vascular;  Laterality: Right;   VASCULAR SURGERY       A IV Location/Drains/Wounds Patient Lines/Drains/Airways Status     Active Line/Drains/Airways     Name Placement date Placement time Site Days   Peripheral IV 03/25/24 18 G Right Antecubital 03/25/24  0020  Antecubital  less than 1   Peripheral IV 03/25/24 20 G Right Hand 03/25/24  0020  Hand  less than 1   Peripheral IV 03/25/24 18 G Left Antecubital 03/25/24  0314  Antecubital  less than 1   NG/OG Vented/Dual Lumen 18 Fr. Oral 03/25/24  0220  Oral  less than 1   Urethral Catheter Devon RN Temperature probe 16 Fr. 03/25/24  0258  Temperature probe  less than 1   Airway 7.5 mm 03/25/24  0217  -- less than 1            Intake/Output Last 24 hours No intake or output data in the 24 hours ending 03/25/24 1610  Labs/Imaging Results for orders placed or performed during the hospital encounter of 03/24/24 (from the past 48 hours)  CBG monitoring, ED     Status: Abnormal   Collection Time: 03/24/24 11:40 PM  Result Value Ref Range   Glucose-Capillary 117 (H) 70 - 99 mg/dL    Comment: Glucose reference range applies only to samples taken after fasting for at least 8 hours.  CBC     Status: Abnormal   Collection  Time: 03/24/24 11:47 PM  Result Value Ref Range   WBC 9.5 4.0 - 10.5 K/uL   RBC 5.99 (H) 4.22 - 5.81 MIL/uL   Hemoglobin 18.7 (H) 13.0 - 17.0 g/dL   HCT 96.0 (H) 45.4 - 09.8 %   MCV 92.3 80.0 - 100.0 fL   MCH 31.2 26.0 - 34.0 pg   MCHC 33.8 30.0 - 36.0 g/dL   RDW 11.9 14.7 - 82.9 %   Platelets 189 150 - 400 K/uL   nRBC 0.0 0.0 - 0.2 %    Comment: Performed at [facility] Lab, [address] [place], [place] [address]  Comprehensive metabolic panel with GFR     Status: Abnormal   Collection Time: 03/24/24 11:47 PM  Result Value Ref Range   Sodium 139 135 - 145 mmol/L   Potassium 3.7 3.5 - 5.1 mmol/L   Chloride 100 98 - 111 mmol/L   CO2 22 22 - 32 mmol/L   Glucose, Bld 106 (H) 70 - 99 mg/dL    Comment: Glucose reference range applies only to samples taken after fasting for at least 8 hours.   BUN 15 8 - 23 mg/dL   Creatinine, Ser 0.86 0.61 - 1.24 mg/dL   Calcium  9.9 8.9 - 10.3 mg/dL   Total Protein 8.1 6.5 - 8.1 g/dL   Albumin  4.6 3.5 - 5.0 g/dL   AST 22 15 - 41 U/L   ALT 16 0 - 44 U/L   Alkaline Phosphatase 54 38 - 126 U/L   Total Bilirubin 0.9 0.0 - 1.2 mg/dL   GFR, Estimated >57 >84 mL/min    Comment: (NOTE) Calculated using the CKD-EPI Creatinine Equation (2021)    Anion gap 17 (H) 5 - 15    Comment: Performed at [facility], [address], [place], [place] [address]  Protime-INR     Status: None   Collection Time: 03/24/24 11:47 PM  Result Value Ref Range   Prothrombin Time 14.0 11.4 - 15.2 seconds   INR 1.1 0.8 - 1.2    Comment: (NOTE) INR goal varies based on device and disease states. Performed at [facility] Lab, [address], [place], [place] [address]   Acetaminophen  level     Status: Abnormal   Collection Time: 03/24/24 11:47 PM  Result Value Ref Range   Acetaminophen  (Tylenol ), Serum <10 (L) 10 - 30 ug/mL    Comment: (NOTE) Therapeutic concentrations vary significantly. A range of 10-30 ug/mL  may be an effective concentration for many  patients. However, some  are best treated at concentrations outside of this range. Acetaminophen  concentrations >150 ug/mL at 4 hours after ingestion  and >50 ug/mL at 12 hours after ingestion are often associated with  toxic reactions.  Performed at  [facility] Lab, [address] [place], [place] [address]   Salicylate level     Status: Abnormal   Collection Time: 03/24/24 11:47 PM  Result Value Ref Range   Salicylate Lvl <7.0 (L) 7.0 - 30.0 mg/dL    Comment: Performed at [facility] Lab, [address], [place], [place] [address]  Ethanol     Status: Abnormal   Collection Time: 03/24/24 11:47 PM  Result Value Ref Range   Alcohol, Ethyl (B) 19 (H) <15 mg/dL    Comment: (NOTE) For medical purposes only. Performed at [facility], [address] [place], [place] [address]   Rapid urine drug screen (hospital performed)     Status: Abnormal   Collection Time: 03/24/24 11:53 PM  Result Value Ref Range   Opiates NONE DETECTED NONE DETECTED   Cocaine NONE DETECTED NONE DETECTED   Benzodiazepines NONE DETECTED NONE DETECTED   Amphetamines NONE DETECTED NONE DETECTED   Tetrahydrocannabinol POSITIVE (A) NONE DETECTED   Barbiturates NONE DETECTED NONE DETECTED    Comment: (NOTE) DRUG SCREEN FOR MEDICAL PURPOSES ONLY.  IF CONFIRMATION IS NEEDED FOR ANY PURPOSE, NOTIFY LAB WITHIN 5 DAYS.  LOWEST DETECTABLE LIMITS FOR URINE DRUG SCREEN Drug Class                     Cutoff (ng/mL) Amphetamine and metabolites    1000 Barbiturate and metabolites    200 Benzodiazepine                 200 Opiates and metabolites        300 Cocaine and metabolites        300 THC                            50 Performed at [facility] Lab, [address] [place], [place] [address]   Urinalysis, Routine w reflex microscopic -Urine, Clean Catch     Status: Abnormal   Collection Time: 03/24/24 11:53 PM  Result Value Ref Range   Color, Urine STRAW (A) YELLOW   APPearance CLEAR CLEAR    Specific Gravity, Urine 1.005 1.005 - 1.030   pH 7.0 5.0 - 8.0   Glucose, UA NEGATIVE NEGATIVE mg/dL   Hgb urine dipstick MODERATE (A) NEGATIVE   Bilirubin Urine NEGATIVE NEGATIVE   Ketones, ur NEGATIVE NEGATIVE mg/dL   Protein, ur 578 (A) NEGATIVE mg/dL   Nitrite NEGATIVE NEGATIVE   Leukocytes,Ua NEGATIVE NEGATIVE   RBC / HPF 11-20 0 - 5 RBC/hpf   WBC, UA 0-5 0 - 5 WBC/hpf   Bacteria, UA NONE SEEN NONE SEEN   Squamous Epithelial / HPF 0-5 0 - 5 /HPF    Comment: Performed at [facility] Lab, [address], [place], [place] [address]  I-Stat arterial blood gas, ED     Status: Abnormal   Collection Time: 03/25/24  3:02 AM  Result Value Ref Range   pH, Arterial 7.333 (L) 7.35 - 7.45   pCO2 arterial 42.7 32 - 48 mmHg   pO2, Arterial 156 (H) 83 - 108 mmHg   Bicarbonate 22.9 20.0 - 28.0 mmol/L   TCO2 24 22 - 32 mmol/L   O2 Saturation 99 %   Acid-base deficit 3.0 (H) 0.0 - 2.0 mmol/L   Sodium 138 135 - 145 mmol/L   Potassium 3.2 (L) 3.5 - 5.1 mmol/L   Calcium , Ion 1.19 1.15 - 1.40 mmol/L  HCT 52.0 39.0 - 52.0 %   Hemoglobin 17.7 (H) 13.0 - 17.0 g/dL   Patient temperature 16.1 F    Sample type ARTERIAL   Troponin I (High Sensitivity)     Status: Abnormal   Collection Time: 03/25/24  3:12 AM  Result Value Ref Range   Troponin I (High Sensitivity) 21 (H) <18 ng/L    Comment: (NOTE) Elevated high sensitivity troponin I (hsTnI) values and significant  changes across serial measurements may suggest ACS but many other  chronic and acute conditions are known to elevate hsTnI results.  Refer to the "Links" section for chest pain algorithms and additional  guidance. Performed at [facility] Lab, [address] [place], [place] [address]   Troponin I (High Sensitivity)     Status: Abnormal   Collection Time: 03/25/24  5:24 AM  Result Value Ref Range   Troponin I (High Sensitivity) 39 (H) <18 ng/L    Comment: (NOTE) Elevated high sensitivity troponin I (hsTnI) values and  significant  changes across serial measurements may suggest ACS but many other  chronic and acute conditions are known to elevate hsTnI results.  Refer to the "Links" section for chest pain algorithms and additional  guidance. Performed at [facility], [address] [place], [place] [address]    CT ANGIO HEAD NECK W WO CM Result Date: 03/25/2024 CLINICAL DATA:  Altered mental status EXAM: CT ANGIOGRAPHY HEAD AND NECK WITH AND WITHOUT CONTRAST TECHNIQUE: Multidetector CT imaging of the head and neck was performed using the standard protocol during bolus administration of intravenous contrast. Multiplanar CT image reconstructions and MIPs were obtained to evaluate the vascular anatomy. Carotid stenosis measurements (when applicable) are obtained utilizing NASCET criteria, using the distal internal carotid diameter as the denominator. RADIATION DOSE REDUCTION: This exam was performed according to the departmental dose-optimization program which includes automated exposure control, adjustment of the mA and/or kV according to patient size and/or use of iterative reconstruction technique. CONTRAST:  75mL OMNIPAQUE  IOHEXOL  350 MG/ML SOLN COMPARISON:  MRA head 03/25/2024 FINDINGS: CTA NECK FINDINGS Skeleton: No acute abnormality or high grade bony spinal canal stenosis. Other neck: Normal pharynx, larynx and major salivary glands. No cervical lymphadenopathy. Unremarkable thyroid gland. Upper chest: Endotracheal tube tip at the level of the clavicular heads. Aortic arch: There is calcific atherosclerosis of the aortic arch. Conventional 3 vessel aortic branching pattern. RIGHT carotid system: Low-density plaque at the carotid bifurcation extending into the proximal ICA. There is a patent stent in the right ICA. No hemodynamically significant stenosis. LEFT carotid system: Predominantly calcified atherosclerosis at the carotid bifurcation extending into the ICA with less than 50% stenosis. Vertebral  arteries: Codominant configuration. Atherosclerotic calcification at both vertebral artery origins causing severe stenosis on the right and mild stenosis on the left. CTA HEAD FINDINGS POSTERIOR CIRCULATION: Normal vertebral artery V4 segments. No proximal occlusion of the anterior or inferior cerebellar arteries. Basilar artery is normal. Superior cerebellar arteries are normal. Posterior cerebral arteries are normal. ANTERIOR CIRCULATION: Atherosclerotic calcification of the internal carotid arteries at the skull base without hemodynamically significant stenosis. Anterior cerebral arteries are normal. Middle cerebral arteries are normal. Venous sinuses: As permitted by contrast timing, patent. Anatomic variants: Fetal origin of the left posterior cerebral artery. Review of the MIP images confirms the above findings. IMPRESSION: 1. No emergent large vessel occlusion. 2. Patent right ICA stent. 3. 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 Aortic  Atherosclerosis (ICD10-I70.0). Electronically Signed   By: Juanetta Nordmann M.D.   On: 03/25/2024 03:06   DG Abdomen 1 View Result Date: 03/25/2024 CLINICAL DATA:  OG tube placement EXAM: ABDOMEN - 1 VIEW COMPARISON:  None Available. FINDINGS: Enteric tube tip and side-port in the stomach. IMPRESSION: Satisfactory position of the enteric tube. Electronically Signed   By: Rozell Cornet M.D.   On: 03/25/2024 02:39   DG Chest Portable 1 View Result Date: 03/25/2024 CLINICAL DATA:  Post intubation EXAM: PORTABLE CHEST 1 VIEW COMPARISON:  Radiograph 03/25/2024 at 12:12 a.m. FINDINGS: Endotracheal tube tip in the intrathoracic trachea 5.4 cm from the carina. Subdiaphragmatic enteric tube. Stable cardiomediastinal silhouette. Pulmonary vascular congestion. No focal consolidation, pleural effusion, or pneumothorax. No displaced rib fractures. IMPRESSION: Endotracheal tube tip in the intrathoracic trachea 5.4 cm  from the carina. Remainder unchanged from 03/25/2024 at 12:12 a.m. Electronically Signed   By: Rozell Cornet M.D.   On: 03/25/2024 02:39   DG Chest Port 1 View Result Date: 03/25/2024 CLINICAL DATA:  Altered mental status. EXAM: PORTABLE CHEST 1 VIEW COMPARISON:  May 26, 2019 FINDINGS: The heart size and mediastinal contours are within normal limits. There is no evidence of acute infiltrate, pleural effusion or pneumothorax. The visualized skeletal structures are unremarkable. IMPRESSION: No acute cardiopulmonary disease. Electronically Signed   By: Virgle Grime M.D.   On: 03/25/2024 00:25   CT HEAD WO CONTRAST ( ) Result Date: 03/25/2024 CLINICAL DATA:  Altered mental status. EXAM: CT HEAD WITHOUT CONTRAST TECHNIQUE: Contiguous axial images were obtained from the base of the skull through the vertex without intravenous contrast. RADIATION DOSE REDUCTION: This exam was performed according to the departmental dose-optimization program which includes automated exposure control, adjustment of the mA and/or kV according to patient size and/or use of iterative reconstruction technique. COMPARISON:  May 27, 2021 FINDINGS: Brain: There is generalized cerebral atrophy with widening of the extra-axial spaces and ventricular dilatation. There are areas of decreased attenuation within the white matter tracts of the supratentorial brain, consistent with microvascular disease changes. A tiny calcification is again seen within the posterior aspect of the frontal lobe on the right. Vascular: Moderate to marked severity bilateral cavernous carotid artery calcification is noted. Skull: Normal. Negative for fracture or focal lesion. Sinuses/Orbits: There is moderate severity right-sided frontal sinus mucosal thickening. Other: None. IMPRESSION: 1. Generalized cerebral atrophy with widening of the extra-axial spaces and ventricular dilatation. 2. No acute intracranial abnormality. 3. Moderate severity right-sided  frontal sinus disease. Electronically Signed   By: Virgle Grime M.D.   On: 03/25/2024 00:16    Pending Labs Unresulted Labs (From admission, onward)     Start     Ordered   03/26/24 0500  Triglycerides  (propofol  (DIPRIVAN ) infusion)  Every 72 hours,   R     Comments: while on propofol  (DIPRIVAN )    03/25/24 0310   03/26/24 0500  CBC  Tomorrow morning,   R        03/25/24 0557   03/26/24 0500  Basic metabolic panel  Tomorrow morning,   R        03/25/24 0557   03/26/24 0500  Magnesium   Tomorrow morning,   R        03/25/24 0557   03/25/24 0558  Hemoglobin A1c  (Glycemic Control (SSI)  Q 4 Hours / Glycemic Control (SSI)  AC +/- HS)  Once,   R       Comments: To assess prior glycemic control  03/25/24 0557   03/25/24 0556  CBC  (heparin )  Once,   R       Comments: Baseline for heparin  therapy IF NOT ALREADY DRAWN.  Notify MD if PLT < 100 K.    03/25/24 0557   03/25/24 0556  Creatinine, serum  (heparin )  Once,   R       Comments: Baseline for heparin  therapy IF NOT ALREADY DRAWN.    03/25/24 0557   03/25/24 0555  MRSA Next Gen by PCR, Nasal  Once,   R        03/25/24 0557   03/25/24 0555  HIV Antibody (routine testing w rflx)  (HIV Antibody (Routine testing w reflex) panel)  Once,   R        03/25/24 0557   03/25/24 0300  Blood gas, arterial  ONCE - STAT,   STAT        03/25/24 0301            Vitals/Pain Today's Vitals   03/25/24 0550 03/25/24 0600 03/25/24 0615 03/25/24 0617  BP:  119/82 (!) 182/105 (!) 144/99  Pulse: 67 62 76 67  Resp: 18 18 19 18   Temp: (!) 96.4 F (35.8 C) (!) 96.3 F (35.7 C) (!) 96.4 F (35.8 C) (!) 96.5 F (35.8 C)  TempSrc:      SpO2: 100% 100% 100% 100%  Weight:      Height:        Isolation Precautions No active isolations  Medications Medications  clevidipine  (CLEVIPREX ) infusion 0.5 mg/mL (0 mg/hr Intravenous Hold 03/25/24 0306)  fentaNYL  in NS (53mcg/ml) infusion-PREMIX (75 mcg/hr Intravenous Rate/Dose  Change 03/25/24 0619)  propofol  (DIPRIVAN ) 1000 MG/100ML infusion (25 mcg/kg/min  84.6 kg Intravenous Rate/Dose Change 03/25/24 0619)  docusate (COLACE) 50 MG/5ML liquid 100 mg (has no administration in time range)  polyethylene glycol (MIRALAX  / GLYCOLAX ) packet 17 g (has no administration in time range)  heparin  injection 5,000 Units (has no administration in time range)  famotidine (PEPCID) tablet 20 mg (has no administration in time range)  insulin aspart (novoLOG) injection 0-9 Units (has no administration in time range)  sodium chloride  0.9 % bolus 1,000 mL (0 mLs Intravenous Stopped 03/25/24 0307)  LORazepam  (ATIVAN ) injection 1 mg (1 mg Intravenous Given 03/25/24 0001)  LORazepam  (ATIVAN ) injection 1 mg (1 mg Intravenous Given 03/25/24 0151)  sodium chloride  0.9 % bolus 1,000 mL (0 mLs Intravenous Stopped 03/25/24 0609)  labetalol  (NORMODYNE ) injection 10 mg (10 mg Intravenous Given 03/25/24 0229)  etomidate (AMIDATE) injection (20 mg Intravenous Given 03/25/24 0216)  rocuronium  (ZEMURON ) injection (80 mg Intravenous Given 03/25/24 0216)  iohexol  (OMNIPAQUE ) 350 MG/ML injection 75 mL (75 mLs Intravenous Contrast Given 03/25/24 0249)    Mobility non-ambulatory - from home independent      Focused Assessments Neuro Assessment Handoff:  Swallow screen pass? No  Cardiac Rhythm: Atrial fibrillation NIH Stroke Scale  Dizziness Present: No Headache Present: No (pt unable to answer question) Level of Consciousness (1a.)   : Not alert, requires repeated stimulation to attend, or is obtunded and requires strong or painful stimulation to make movements (not stereotyped) LOC Questions (1b. )   : Answers neither question correctly LOC Commands (1c. )   : Performs neither task correctly Best Gaze (2. )  : Normal Visual (3. )  : No visual loss Facial Palsy (4. )    : Normal symmetrical movements Motor Arm, Left (5a. )   : Drift Motor Arm, Right (  5b. ) : Drift Motor Leg, Left (6a. )  :  Drift Motor Leg, Right (6b. ) : Drift Limb Ataxia (7. ): Amputation or joint fusion Sensory (8. )  : Normal, no sensory loss Best Language (9. )  : Mute, global aphasia Dysarthria (10. ): Intubated or other physical barrier Extinction/Inattention (11.)   : No Abnormality Complete NIHSS TOTAL: 13     Neuro Assessment: (S) Exceptions to WDL Neuro Checks:      Has TPA been given? No If patient is a Neuro Trauma and patient is going to OR before floor call report to 4N Charge nurse: [phone] or [phone]   R Recommendations: See Admitting Provider Note  Report given to:   Additional Notes: -

## 2024-03-25 NOTE — ED Notes (Signed)
 RN with pt in CT

## 2024-03-25 NOTE — Progress Notes (Signed)
 Pt taken to MRI with continuous monitoring of HR and Sat. Prop increased to 60 for pt to tolerate MRI.

## 2024-03-25 NOTE — Progress Notes (Signed)
 Patient transported from ED Trauma C to [phone] with RT and RN. No complications noted.

## 2024-03-25 NOTE — Progress Notes (Signed)
  Echocardiogram 2D Echocardiogram has been performed.  Tatsuya Okray 03/25/2024, 5:40 PM

## 2024-03-25 NOTE — ED Notes (Signed)
Pt transported to and from CT by this RN.

## 2024-03-25 NOTE — ED Notes (Signed)
 Propofol  started at this time at 20 mcg verbal order per EDP

## 2024-03-25 NOTE — Procedures (Signed)
 Extubation Procedure Note  Patient Details:   Name: Tyler Burns DOB: [birth year] MRN: [id number]   Airway Documentation:    Vent end date: 03/25/24 Vent end time: 1443   Evaluation  O2 sats: stable throughout Complications: No apparent complications Patient did tolerate procedure well. Bilateral Breath Sounds: Clear   Yes  Patient extubated per order to 2L [place] with no apparent complications. Positive cuff leak was noted prior to extubation. Patient is alert, has strong cough, and is able to speak. Vitals are stable.   [facility] 03/25/2024, 2:47 PM

## 2024-03-25 NOTE — H&P (Signed)
 NAME:  Tyler Burns, MRN:  [id number], DOB:  11/30/1955, LOS: 0 ADMISSION DATE:  03/24/2024, CONSULTATION DATE:  03/25/24 REFERRING MD:  EDP, CHIEF COMPLAINT:  acute ams   History of Present Illness:  68 yo male presents 2/2 ams, acute. Pt was reportedly found by his friends/family severely altered after last being seen at baseline around 1400. All history is obtained from chart and EDP as pt is intubated and sedated at time of my consultation.   Due to his encephalopathy pt has been unable to  lay flat and comply with imaging. In light of this, EDP elected to intubate pt. Neuro has been consulted and recommended MRI per EDP, no note pending at this time. Pt has not had any fever at the time at the time of my consultation.   In addition his BP has remained elevated and was started on titratable cleviprex .   Ccm was asked to admit pt in light of his intubated status as well as titratable cleviprex .   Pertinent  Medical History  H/o cva Asthma Afib Gerd Glaucoma Hyperlipidemia H/o htn  Significant Hospital Events: Including procedures, antibiotic start and stop dates in addition to other pertinent events   Admitted to ICU 5/30  Interim History / Subjective:    Objective    Blood pressure (!) 124/92, pulse 85, temperature (!) 96.7 F (35.9 C), resp. rate 17, height 5\' 11"  (1.803 m), SpO2 98%.    Vent Mode: PRVC FiO2 (%):  [45 %] 45 % Set Rate:  [18 bmp] 18 bmp Vt Set:  [600 mL] 600 mL PEEP:  [5 cmH20] 5 cmH20 Plateau Pressure:  [13 cmH20] 13 cmH20  No intake or output data in the 24 hours ending 03/25/24 0441 There were no vitals filed for this visit.  Examination: General: chronically ill appearing, disheveled intubated and sedated HENT: ncat, perrla, sclera injected Lungs: ctab Cardiovascular: irreg irreg Abdomen: soft nt ndbs+ Extremities: no c/c/e Neuro: sedated and intubated but reportedly previously moving all 4 without deficits GU: deferred.   Resolved  problem list   Assessment and Plan  Acute encephalopathy, metabolic Acute hypoxic resp failure Acute hypertenisve emergency R vertebral artery stenosis, severe, chronic afib H/o cva H/o thc use Asthma Htn Hyperlipidemia Gerd -titrate vent, sat/sbt when able, vap prevention -monitor mental status -check ammonia for completeness sake - cth negative for acute bleed or ischemia -mri pending -need to touch base with neuro to confirm consultation if warranted if mental status does not improve -eeg as well if above -titrate cleviprex  at this time to reduce BP 20% -on eliquis  at baseline -resume home anti-htn agents as able    Best Practice (right click and "Reselect all SmartList Selections" daily)   Diet/type: NPO DVT prophylaxis DOAC Pressure ulcer(s): N/A GI prophylaxis: H2B Lines: N/A Foley:  Yes, and it is still needed Code Status:  full code Last date of multidisciplinary goals of care discussion [pending d/w family]  Labs   CBC: Recent Labs  Lab 03/24/24 2347 03/25/24 0302  WBC 9.5  --   HGB 18.7* 17.7*  HCT 55.3* 52.0  MCV 92.3  --   PLT 189  --     Basic Metabolic Panel: Recent Labs  Lab 03/24/24 2347 03/25/24 0302  NA 139 138  K 3.7 3.2*  CL 100  --   CO2 22  --   GLUCOSE 106*  --   BUN 15  --   CREATININE 1.03  --   CALCIUM  9.9  --  GFR: CrCl cannot be calculated (Unknown ideal weight.). Recent Labs  Lab 03/24/24 2347  WBC 9.5    Liver Function Tests: Recent Labs  Lab 03/24/24 2347  AST 22  ALT 16  ALKPHOS 54  BILITOT 0.9  PROT 8.1  ALBUMIN  4.6   No results for input(s): "LIPASE", "AMYLASE" in the last 168 hours. No results for input(s): "AMMONIA" in the last 168 hours.  ABG    Component Value Date/Time   PHART 7.333 (L) 03/25/2024 0302   PCO2ART 42.7 03/25/2024 0302   PO2ART 156 (H) 03/25/2024 0302   HCO3 22.9 03/25/2024 0302   TCO2 24 03/25/2024 0302   ACIDBASEDEF 3.0 (H) 03/25/2024 0302   O2SAT 99 03/25/2024 0302      Coagulation Profile: Recent Labs  Lab 03/24/24 2347  INR 1.1    Cardiac Enzymes: No results for input(s): "CKTOTAL", "CKMB", "CKMBINDEX", "TROPONINI" in the last 168 hours.  HbA1C: Hgb A1c MFr Bld  Date/Time Value Ref Range Status  05/27/2021 07:39 AM 6.3 (H) 4.8 - 5.6 % Final    Comment:    (NOTE) Pre diabetes:          5.7%-6.4%  Diabetes:              >6.4%  Glycemic control for   <7.0% adults with diabetes     CBG: Recent Labs  Lab 03/24/24 2340  GLUCAP 117*    Review of Systems:   Unobtainable 2/2 intubated and sedated status.   Past Medical History:  He,  has a past medical history of Asthma, Carotid artery occlusion, Dysrhythmia, GERD (gastroesophageal reflux disease), Glaucoma, High cholesterol, History of cardiovascular stress test, Hypertension, and Stroke (HCC).   Surgical History:   Past Surgical History:  Procedure Laterality Date   ABDOMINAL AORTIC ANEURYSM REPAIR N/A 05/25/2019   Procedure: ANEURYSM ABDOMINAL AORTIC REPAIR using a HEMASHIELD GOLD VASCULAR GRAFT;  Surgeon: Mayo Speck, MD;  Location: [facility] OR;  Service: Vascular;  Laterality: N/A;   APPENDECTOMY     EYE SURGERY Right    cataract   INGUINAL HERNIA REPAIR Right 06/15/2015   Procedure: RIGHT INGUINAL HERNIA REPAIR WITH MESH;  Surgeon: Adalberto Hollow, MD;  Location: [facility] OR;  Service: General;  Laterality: Right;   INSERTION OF MESH Right 06/15/2015   Procedure: INSERTION OF MESH;  Surgeon: Adalberto Hollow, MD;  Location: [facility] OR;  Service: General;  Laterality: Right;   TRANSCAROTID ARTERY REVASCULARIZATION  Right 06/05/2021   Procedure: RIGHT TRANSCAROTID ARTERY REVASCULARIZATION;  Surgeon: Young Hensen, MD;  Location: [facility] OR;  Service: Vascular;  Laterality: Right;   VASCULAR SURGERY       Social History:   reports that he quit smoking about 55 years ago. His smoking use included cigarettes. He has never used smokeless tobacco. He reports that he does not drink alcohol  and does not use drugs.   Family History:  His family history includes Congestive Heart Failure in his mother; Heart attack (age of onset: 8) in his brother; Heart attack (age of onset: 49) in his mother; Heart failure (age of onset: 52) in his sister; Leukemia in his brother. There is no history of Stroke.   Allergies Allergies  Allergen Reactions   Brinzolamide -Brimonidine  Shortness Of Breath    Feels he has difficulty breathing when on medication Eye drop medication     Home Medications  Prior to Admission medications   Medication Sig Start Date End Date Taking? Authorizing Provider  atorvastatin  (LIPITOR ) 80 MG tablet Take 80  mg by mouth daily.   Yes [provider]  sildenafil (REVATIO) 20 MG tablet Take 20 mg by mouth daily. 03/03/24  Yes [provider]  acetaminophen  (TYLENOL ) 325 MG tablet Take 2 tablets (650 mg total) by mouth every 4 (four) hours as needed for mild pain (or temp > 37.5 C (99.5 F)). 06/26/21   Joette Mustard, MD  apixaban  (ELIQUIS ) 5 MG TABS tablet Take 1 tablet (5 mg total) by mouth 2 (two) times daily. 06/26/21   Joette Mustard, MD  bimatoprost (LUMIGAN) 0.01 % SOLN Place 1 drop into both eyes at bedtime.    [provider]  cetirizine  (ZYRTEC ) 10 MG tablet Take 10 mg by mouth daily as needed.    [provider]  clopidogrel  (PLAVIX ) 75 MG tablet Take 1 tablet (75 mg total) by mouth daily. 06/27/21   Joette Mustard, MD  metoprolol  succinate (TOPROL -XL) 50 MG 24 hr tablet Take 50 mg by mouth daily. Take with or immediately following a meal.    [provider]  Omega-3 Fatty Acids (FISH OIL) 600 MG CAPS Take 600 mg by mouth daily.     [provider]     Critical care time: 

## 2024-03-25 NOTE — ED Provider Notes (Signed)
 Procedure Name: Intubation Date/Time: 03/25/2024 2:33 AM  Performed by: Royann Cords, PAPre-anesthesia Checklist: Patient identified, Emergency Drugs available, Suction available, Timeout performed and Patient being monitored Oxygen Delivery Method: Non-rebreather mask Preoxygenation: Pre-oxygenation with 100% oxygen Induction Type: Rapid sequence Laryngoscope Size: Glidescope Tube size: 7.5 mm Number of attempts: 1 Airway Equipment and Method: Video-laryngoscopy Placement Confirmation: ETT inserted through vocal cords under direct vision, Positive ETCO2 and Breath sounds checked- equal and bilateral Secured at: 23 cm Tube secured with: ETT holder        Royann Cords, PA 03/25/24 0235    Edson Graces, MD 03/25/24 [id number]

## 2024-03-25 NOTE — ED Provider Notes (Signed)
 [facility]-EMERGENCY DEPT [facility] Emergency Department Provider Note MRN:  [id number]  Arrival date & time: 03/25/24     Chief Complaint   Altered Mental Status   History of Present Illness   Tyler Burns is a 68 y.o. year-old male with a history of stroke presenting to the ED with chief complaint of altered mental status.  Patient was found severely altered by friends/family.  Last seen normal 2 PM.  Strip stroke, also history of THC use.  Review of Systems  A thorough review of systems was obtained and all systems are negative except as noted in the HPI and PMH.   Patient's Health History    Past Medical History:  Diagnosis Date   Asthma    Carotid artery occlusion    Dysrhythmia    Afib   GERD (gastroesophageal reflux disease)    Glaucoma    High cholesterol    History of cardiovascular stress test    Myoview  12/19:  EF 50%, No ischemia, Low Risk   Hypertension    Stroke [facility])    August 2022    Past Surgical History:  Procedure Laterality Date   ABDOMINAL AORTIC ANEURYSM REPAIR N/A 05/25/2019   Procedure: ANEURYSM ABDOMINAL AORTIC REPAIR using a HEMASHIELD GOLD VASCULAR GRAFT;  Surgeon: Mayo Speck, MD;  Location: [facility] OR;  Service: Vascular;  Laterality: N/A;   APPENDECTOMY     EYE SURGERY Right    cataract   INGUINAL HERNIA REPAIR Right 06/15/2015   Procedure: RIGHT INGUINAL HERNIA REPAIR WITH MESH;  Surgeon: Adalberto Hollow, MD;  Location: [facility] OR;  Service: General;  Laterality: Right;   INSERTION OF MESH Right 06/15/2015   Procedure: INSERTION OF MESH;  Surgeon: Adalberto Hollow, MD;  Location: [facility] OR;  Service: General;  Laterality: Right;   TRANSCAROTID ARTERY REVASCULARIZATION  Right 06/05/2021   Procedure: RIGHT TRANSCAROTID ARTERY REVASCULARIZATION;  Surgeon: Young Hensen, MD;  Location: [facility] OR;  Service: Vascular;  Laterality: Right;   VASCULAR SURGERY      Family History  Problem Relation Age of Onset   Congestive Heart Failure Mother     Heart attack Mother 11   Heart attack Brother 67   Leukemia Brother    Heart failure Sister 52   Stroke Neg Hx     Social History   Socioeconomic History   Marital status: Divorced    Spouse name: Not on file   Number of children: Not on file   Years of education: Not on file   Highest education level: Not on file  Occupational History   Not on file  Tobacco Use   Smoking status: Former    Current packs/day: 0.00    Types: Cigarettes    Quit date: 1970    Years since quitting: 55.4   Smokeless tobacco: Never  Vaping Use   Vaping status: Never Used  Substance and Sexual Activity   Alcohol use: No   Drug use: No   Sexual activity: Not on file  Other Topics Concern   Not on file  Social History Narrative   Not on file   Social Drivers of Health   Financial Resource Strain: Not on file  Food Insecurity: Not on file  Transportation Needs: Not on file  Physical Activity: Not on file  Stress: Not on file  Social Connections: Not on file  Intimate Partner Violence: Not on file     Physical Exam   Vitals:   03/25/24 0226 03/25/24 0227  BP:  Pulse: (!) 128 (!) 139  Resp: 18 18  Temp: (!) 96.7 F (35.9 C) (!) 96.8 F (36 C)  SpO2: 98% 98%    CONSTITUTIONAL: Ill-appearing, NAD NEURO/PSYCH: Eyes closed, somnolent, purposeful movements with noxious stimuli, moving all extremities, mildly agitated at times EYES:  eyes equal and reactive ENT/NECK:  no LAD, no JVD CARDIO: Tachycardic rate, well-perfused, normal S1 and S2 PULM:  CTAB no wheezing or rhonchi GI/GU:  non-distended, non-tender MSK/SPINE:  No gross deformities, no edema SKIN:  no rash, atraumatic   *Additional and/or pertinent findings included in MDM below  Diagnostic and Interventional Summary    EKG Interpretation Date/Time:  Thursday Mar 24 2024 23:44:13 EDT Ventricular Rate:  143 PR Interval:    QRS Duration:  100 QT Interval:  319 QTC Calculation: 491 R Axis:   96  Text  Interpretation: Sinus tachycardia Right axis deviation Repolarization abnormality, prob rate related Borderline prolonged QT interval Confirmed by Gwenetta Lennert [phone]) on 03/25/2024 12:50:38 AM       Labs Reviewed  CBC - Abnormal; Notable for the following components:      Result Value   RBC 5.99 (*)    Hemoglobin 18.7 (*)    HCT 55.3 (*)    All other components within normal limits  COMPREHENSIVE METABOLIC PANEL WITH GFR - Abnormal; Notable for the following components:   Glucose, Bld 106 (*)    Anion gap 17 (*)    All other components within normal limits  ACETAMINOPHEN  LEVEL - Abnormal; Notable for the following components:   Acetaminophen  (Tylenol ), Serum <10 (*)    All other components within normal limits  SALICYLATE LEVEL - Abnormal; Notable for the following components:   Salicylate Lvl <7.0 (*)    All other components within normal limits  ETHANOL - Abnormal; Notable for the following components:   Alcohol, Ethyl (B) 19 (*)    All other components within normal limits  CBG MONITORING, ED - Abnormal; Notable for the following components:   Glucose-Capillary 117 (*)    All other components within normal limits  PROTIME-INR  RAPID URINE DRUG SCREEN, HOSP PERFORMED  URINALYSIS, ROUTINE W REFLEX MICROSCOPIC  TROPONIN I (HIGH SENSITIVITY)    DG Chest Port 1 View  Final Result    CT HEAD WO CONTRAST ( )  Final Result    CT ANGIO HEAD NECK W WO CM    (Results Pending)  DG Chest Portable 1 View    (Results Pending)  DG Abdomen 1 View    (Results Pending)  MR BRAIN WO CONTRAST    (Results Pending)    Medications  sodium chloride  0.9 % bolus 1,000 mL (has no administration in time range)  clevidipine  (CLEVIPREX ) infusion 0.5 mg/mL (has no administration in time range)  fentaNYL  in NS (10mcg/ml) infusion-PREMIX (has no administration in time range)  sodium chloride  0.9 % bolus 1,000 mL (1,000 mLs Intravenous New Bag/Given 03/25/24 0020)  LORazepam  (ATIVAN )  injection 1 mg (1 mg Intravenous Given 03/25/24 0001)  LORazepam  (ATIVAN ) injection 1 mg (1 mg Intravenous Given 03/25/24 0151)  labetalol  (NORMODYNE ) injection 10 mg (10 mg Intravenous Given 03/25/24 0229)  etomidate (AMIDATE) injection (20 mg Intravenous Given 03/25/24 0216)  rocuronium  (ZEMURON ) injection (80 mg Intravenous Given 03/25/24 0216)     Procedures  /  Critical Care .Critical Care  Performed by: Edson Graces, MD Authorized by: Edson Graces, MD   Critical care provider statement:    Critical care time (minutes):  80  Critical care was necessary to treat or prevent imminent or life-threatening deterioration of the following conditions:  CNS failure or compromise   Critical care was time spent personally by me on the following activities:  Development of treatment plan with patient or surrogate, discussions with consultants, evaluation of patient's response to treatment, examination of patient, ordering and review of laboratory studies, ordering and review of radiographic studies, ordering and performing treatments and interventions, pulse oximetry, re-evaluation of patient's condition and review of old charts   ED Course and Medical Decision Making  Initial Impression and Ddx Initial impression is THC or delta 9 overdose just based on the gestalt of patient's behavior in the tachycardia which is associated with this type of overdose.  Of course must also consider intracranial bleeding, frontal lobe hemorrhage can present similarly, obtaining rapid CT head.  Past medical/surgical history that increases complexity of ED encounter: Stroke, A-fib  Interpretation of Diagnostics I personally reviewed the EKG and my interpretation is as follows: Sinus tachycardia  No significant blood count or electrolyte disturbance.  CT head unremarkable.  Patient Reassessment and Ultimate Disposition/Management     Patient with continued agitation and altered mental status, unable to obtain  CTA due to his agitation, starting to be, unsafe with the required sedation.  I also discussed case with Dr. Renaee Caro of neurology, patient will need MRI.  Favoring hypertensive emergency rather than stroke at this time based on exam.  Plan is to treat the blood pressure, patient intubated for airway protection and to facilitate management.  See separate note for details.  I was present and assisting during the intubation.  Currently meningitis/encephalitis is not high in the differential, no need for LP at this time per neurology.  Will admit to intensivist.  Patient management required discussion with the following services or consulting groups:  Intensivist Service and Neurology  Complexity of Problems Addressed Acute illness or injury that poses threat of life of bodily function  Additional Data Reviewed and Analyzed Further history obtained from: EMS on arrival and Further history from spouse/family member  Additional Factors Impacting ED Encounter Risk Consideration of hospitalization  Merrick Abe. Harless Lien, MD [facility] Emergency Medicine [facility] [email or web address]  Final Clinical Impressions(s) / ED Diagnoses     ICD-10-CM   1. Encephalopathy, unspecified type  G93.40     2. Hypertensive emergency  I16.1       ED Discharge Orders     None        Discharge Instructions Discussed with and Provided to Patient:   Discharge Instructions   None      Edson Graces, MD 03/25/24 [phone]

## 2024-03-25 NOTE — Progress Notes (Signed)
 Transported pt from 4N to MRI and back with bedside RN and tranposrt. Vitals are stable.

## 2024-03-25 NOTE — ED Notes (Signed)
 Pt unable to tolerate CT imaging ordered. Pt was trashing around. No following commands. Pulling away CT straps. EDP informed via phone call. Verbal order to return to room for possible intubation. RT and Staff at bedside

## 2024-03-25 NOTE — ED Notes (Signed)
 Pt assigned ned 71M05. RN attempted report. 71M will be ready for pt after 7 am.

## 2024-03-26 DIAGNOSIS — N179 Acute kidney failure, unspecified: Secondary | ICD-10-CM

## 2024-03-26 DIAGNOSIS — I4891 Unspecified atrial fibrillation: Secondary | ICD-10-CM

## 2024-03-26 LAB — BASIC METABOLIC PANEL WITH GFR
Anion gap: 12 (ref 5–15)
BUN: 14 mg/dL (ref 8–23)
CO2: 24 mmol/L (ref 22–32)
Calcium: 9.7 mg/dL (ref 8.9–10.3)
Chloride: 100 mmol/L (ref 98–111)
Creatinine, Ser: 1.49 mg/dL — ABNORMAL HIGH (ref 0.61–1.24)
GFR, Estimated: 50 mL/min — ABNORMAL LOW (ref >60)
Glucose, Bld: 115 mg/dL — ABNORMAL HIGH (ref 70–99)
Potassium: 4.8 mmol/L (ref 3.5–5.1)
Sodium: 136 mmol/L (ref 135–145)

## 2024-03-26 LAB — CBC
HCT: 54.7 % — ABNORMAL HIGH (ref 39.0–52.0)
Hemoglobin: 18.1 g/dL — ABNORMAL HIGH (ref 13.0–17.0)
MCH: 30.9 pg (ref 26.0–34.0)
MCHC: 33.1 g/dL (ref 30.0–36.0)
MCV: 93.5 fL (ref 80.0–100.0)
Platelets: 194 10*3/uL (ref 150–400)
RBC: 5.85 MIL/uL — ABNORMAL HIGH (ref 4.22–5.81)
RDW: 14.2 % (ref 11.5–15.5)
WBC: 9.7 10*3/uL (ref 4.0–10.5)
nRBC: 0 % (ref 0.0–0.2)

## 2024-03-26 LAB — TRIGLYCERIDES: Triglycerides: 198 mg/dL — ABNORMAL HIGH (ref <150)

## 2024-03-26 LAB — MAGNESIUM: Magnesium: 2.1 mg/dL (ref 1.7–2.4)

## 2024-03-26 LAB — GLUCOSE, CAPILLARY
Glucose-Capillary: 128 mg/dL — ABNORMAL HIGH (ref 70–99)
Glucose-Capillary: 110 mg/dL — ABNORMAL HIGH (ref 70–99)
Glucose-Capillary: 115 mg/dL — ABNORMAL HIGH (ref 70–99)

## 2024-03-26 MED ORDER — LACTATED RINGERS IV BOLUS: 1000.0000 mL | Freq: Once | INTRAVENOUS | Status: DC

## 2024-03-26 MED ORDER — CLOPIDOGREL BISULFATE 75 MG PO TABS: 75.0000 mg | ORAL_TABLET | Freq: Every day | ORAL | Status: AC

## 2024-03-26 MED ORDER — SODIUM CHLORIDE 0.9 % IV BOLUS
1000.0000 mL | Freq: Once | INTRAVENOUS | Status: AC
  Administered 2024-03-26: 1000 mL via INTRAVENOUS

## 2024-03-26 NOTE — Evaluation (Signed)
 Physical Therapy Evaluation & Discharge Patient Details Name: Tyler Burns MRN: [id number] DOB: [birth year] Today's Date: 03/26/2024  History of Present Illness  Pt is a 68 y.o. male who presented 03/24/24 with AMS. Admitted with acute encephalopathy. Intubated and extubated 5/30. PMH: asthma, AAA s/p repair in 2020, Afib, HTN, low back pain, glaucoma, CVA  Clinical Impression  Pt presents with condition above. Pt appears and reports to be back to his baseline, performing all functional mobility safely without UE support, AD, LOB, or assistance. He demonstrates WFL, intact, and symmetrical bil lower and upper extremity strength, sensation, and coordination. All education completed and questions answered. Pt requesting OPPT to manage his chronic L neck pain. Acute PT will sign off.       If plan is discharge home, recommend the following: Supervision due to cognitive status   Can travel by private vehicle        Equipment Recommendations None recommended by PT  Recommendations for Other Services       Functional Status Assessment Patient has not had a recent decline in their functional status     Precautions / Restrictions Precautions Precautions: None Restrictions Weight Bearing Restrictions Per Provider Order: No      Mobility  Bed Mobility Overal bed mobility: Modified Independent             General bed mobility comments: HOB elevated, no assistance needed    Transfers Overall transfer level: Independent Equipment used: None               General transfer comment: No assistance needed, no LOB    Ambulation/Gait Ambulation/Gait assistance: Independent Gait Distance (Feet): 320 Feet Assistive device: None Gait Pattern/deviations: WFL(Within Functional Limits) Gait velocity: WFL Gait velocity interpretation: >2.62 ft/sec, indicative of community ambulatory   General Gait Details: Pt ambulates steadily without LOB, even when cued to change speeds,  directions, and head positions.  Stairs            Wheelchair Mobility     Tilt Bed    Modified Rankin (Stroke Patients Only) Modified Rankin (Stroke Patients Only) Pre-Morbid Rankin Score: No symptoms Modified Rankin: No symptoms     Balance Overall balance assessment: No apparent balance deficits (not formally assessed)                                           Pertinent Vitals/Pain Pain Assessment Pain Assessment: Faces Faces Pain Scale: Hurts a little bit Pain Location: L eye (has potential scratch on eye per RN) Pain Descriptors / Indicators: Discomfort Pain Intervention(s): Limited activity within patient's tolerance, Monitored during session    Home Living Family/patient expects to be discharged to:: Private residence Living Arrangements: Spouse/significant other Available Help at Discharge: Family;Available PRN/intermittently Type of Home: House Home Access: Ramped entrance       Home Layout: One level Home Equipment: Agricultural consultant (2 wheels);Cane - single point;BSC/3in1;Shower seat;Grab bars - tub/shower;Wheelchair - power      Prior Function Prior Level of Function : Independent/Modified Independent;Driving             Mobility Comments: No AD       Extremity/Trunk Assessment   Upper Extremity Assessment Upper Extremity Assessment: Overall WFL for tasks assessed (MMT scores grossly of 5 bil; sensation and coordination grossly intact bil)    Lower Extremity Assessment Lower Extremity Assessment: Overall WFL for tasks assessed (  MMT scores grossly of 5 bil; sensation and coordination grossly intact bil)    Cervical / Trunk Assessment Cervical / Trunk Assessment: Normal  Communication   Communication Communication: Impaired Factors Affecting Communication: Hearing impaired (questionable hearing deficits)    Cognition Arousal: Alert Behavior During Therapy: WFL for tasks assessed/performed   PT - Cognitive  impairments: No apparent impairments                       PT - Cognition Comments: questionable hearing deficits impacting some comprehension of questions/cues but overall able to follow cues appropriately when he was able to hear Following commands: Intact       Cueing Cueing Techniques: Verbal cues     General Comments General comments (skin integrity, edema, etc.): educated pt on stretches (gentle) to manage L neck pain; HEP handout Access Code: [email or web address]    Exercises     Assessment/Plan    PT Assessment Patient does not need any further PT services  PT Problem List         PT Treatment Interventions      PT Goals (Current goals can be found in the Care Plan section)  Acute Rehab PT Goals Patient Stated Goal: to go home PT Goal Formulation: All assessment and education complete, DC therapy Time For Goal Achievement: 03/27/24 Potential to Achieve Goals: Good    Frequency       Co-evaluation               AM-PAC PT "6 Clicks" Mobility  Outcome Measure Help needed turning from your back to your side while in a flat bed without using bedrails?: None Help needed moving from lying on your back to sitting on the side of a flat bed without using bedrails?: None Help needed moving to and from a bed to a chair (including a wheelchair)?: None Help needed standing up from a chair using your arms (e.g., wheelchair or bedside chair)?: None Help needed to walk in hospital room?: None Help needed climbing 3-5 steps with a railing? : None 6 Click Score: 24    End of Session   Activity Tolerance: Patient tolerated treatment well Patient left: in chair;with call bell/phone within reach Nurse Communication: Mobility status PT Visit Diagnosis: Other (comment);Pain (cognitive concerns) Pain - Right/Left: Left Pain - part of body:  (eye)    Time: 1610-9604 PT Time Calculation (min) (ACUTE ONLY): 23 min   Charges:   PT Evaluation $PT Eval Low Complexity: 1  Low PT Treatments $Therapeutic Activity: 8-22 mins PT General Charges $$ ACUTE PT VISIT: 1 Visit         Vernida Goodie, PT, DPT Acute Rehabilitation Services  Office: [phone]   Ellyn Hack 03/26/2024, 11:02 AM

## 2024-03-26 NOTE — Discharge Summary (Signed)
 Physician Discharge Summary         Patient ID: Tyler Burns MRN: [id number] DOB/AGE: 70/02/1956 70 y.o.  Admit date: 03/24/2024 Discharge date: 03/26/2024  Discharge Diagnoses:    Active Hospital Problems   Diagnosis Date Noted   Hypertensive emergency 03/25/2024    Resolved Hospital Problems  No resolved problems to display.      Discharge summary    67 yo male with PMH of HTN, HLD, afib, CVA, asthma, GERD, THC use, and glaucoma who presented 5/29 with altered mental status and agitated, previously seen earlier that day in normal state of health.  Pt unable to lay flat who required intubation in ER for further workup, found hypertensive requiring cleviprex  gtt.   CTH no new acute intracranial abnormality.  EDP discussed with Neuro, underwent MRI brain also negative for acute abnormality.  CTA head/ neck showed chronic severe right vertebral artery stenosis.  Was admitted to [facility].  Weaned off cleviprex  5/30 and successfully able to be extubated 5/30.  Remains neurology intact.  Pt reports taking several valium for neck pain with concurrent alcohol use.  Also UDS +for THC, likely etiology for encephalopathy. Repeat labs show mild AKI with sCr 1.03> 1.49 given 1L of NS and resumed on home meds.  Evaluated by PT with recommended exercises for chronic left neck pain. Tolerating diet.  Pt stable for discharge home and to follow up with primary care provider.   Discharge Plan by Active Problems    Acute encephalopathy Likely drug induced based on reported benzodiazepine, alcohol use, and THC use. MRI brain unremarkable. Mentation has returned to baseline. 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 Generalized cerebral atrophy with widening of the extra-axial spaces and ventricular dilatation. 2. No acute intracranial abnormality. 3. Moderate severity right-sided frontal sinus disease.  CTA head/ neck 03/25/24 1. No emergent large vessel occlusion. 2. Patent right ICA stent. 3. 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 Aortic Atherosclerosi  MRI Brain 03/25/24 1. No acute intracranial abnormality. 2. 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   Echo 03/26/24 LV EF 60-65%. Mild concentric LVH. RV size and systolic function are normal. RVSP estimated 32.44mmHg. LA and RA severely dilated. Mitral valve is normal. Aortic valve is normal. Pulmonic valve is normal. Tricuspid valve is normal.  Procedures   ETT 5/29> 5/30  Culture data/antimicrobials   5/30 MRSA PCR > neg   Consults  N/a    Discharge Exam: BP (!) 141/91   Pulse 63   Temp 98 F (36.7 C) (Oral)   Resp 15   Ht 5\' 11"  (1.803 m)  Wt 84.6 kg   SpO2 93%   BMI 26.01 kg/m   General:  pleasant older male sitting in bedside chair in NAD, anxious to go home HEENT: MM pink/moist, some left eye irritation, occasional tearing, denies vision changes  Neuro: Aox4, MAE CV: irir, 50-60s, no murmur PULM:  non labored, CTA, RA GI: soft, bs+, ND  Extremities: warm/dry, no LE edema  Skin: no rashes  Labs at discharge   Lab Results  Component Value Date    CREATININE 1.49 (H) 03/26/2024   BUN 14 03/26/2024   NA 136 03/26/2024   K 4.8 03/26/2024   CL 100 03/26/2024   CO2 24 03/26/2024   Lab Results  Component Value Date   WBC 9.7 03/26/2024   HGB 18.1 (H) 03/26/2024   HCT 54.7 (H) 03/26/2024   MCV 93.5 03/26/2024   PLT 194 03/26/2024   Lab Results  Component Value Date   ALT 16 03/24/2024   AST 22 03/24/2024   ALKPHOS 54 03/24/2024   BILITOT 0.9 03/24/2024   Lab Results  Component Value Date   INR 1.1 03/24/2024   INR 1.2 06/04/2021   INR 1.1 05/27/2021    Current radiological studies    ECHOCARDIOGRAM COMPLETE Result Date: 03/25/2024    ECHOCARDIOGRAM REPORT   Patient Name:   Tyler Burns Date of Exam: 03/25/2024 Medical Rec #:  [id number]     Height:       71.0 in Accession #:    [id number]    Weight:       186.5 lb Date of Birth:  19-Jan-1956     BSA:          2.047 m Patient Age:    67 years      BP:           162/113 mmHg Patient Gender: M             HR:           77 bpm. Exam Location:  Inpatient Procedure: 2D Echo (Both Spectral and Color Flow Doppler were utilized during            procedure). Indications:    congestive heart failure  History:        Patient has prior history of Echocardiogram examinations, most                 recent 05/27/2021. Arrythmias:Atrial Fibrillation,                 Signs/Symptoms:Altered Mental Status; Risk Factors:Hypertension                 and Dyslipidemia.  Sonographer:    Dione Franks RDCS Referring Phys: [id number] JESSICA MARSHALL IMPRESSIONS  1. Left ventricular ejection fraction, by estimation, is 60 to 65%. The left ventricle has normal function. The left ventricle has no regional wall motion abnormalities. There is mild concentric left ventricular hypertrophy. Left ventricular diastolic function could not be evaluated.  2. Right ventricular systolic function is normal. The right ventricular size is normal. There is normal pulmonary artery systolic pressure. The estimated right ventricular  systolic pressure is 32.6 mmHg.  3. Left atrial size was severely dilated.  4. Right atrial size was severely dilated.  5. The mitral valve is normal in structure. Trivial mitral valve regurgitation. No evidence of mitral stenosis.  6. The aortic valve is tricuspid. There is mild calcification of the aortic valve. Aortic valve regurgitation is not visualized. No aortic stenosis is present.  7. Aortic dilatation noted. There is borderline dilatation of the aortic root, measuring 39 mm.  8. The inferior vena cava is dilated in size with <50% respiratory variability, suggesting right atrial pressure of 15 mmHg. FINDINGS  Left Ventricle: Left ventricular ejection fraction, by estimation, is 60 to 65%. The left ventricle has normal function. The left ventricle has no regional wall motion abnormalities. The left ventricular internal cavity size was normal in size. There is  mild concentric left ventricular hypertrophy. Left ventricular diastolic function could not be evaluated due to atrial fibrillation. Left ventricular diastolic function could not be evaluated. Right Ventricle: The right ventricular size is normal. No increase in right ventricular wall thickness. Right ventricular systolic function is normal. There is normal pulmonary artery systolic pressure. The tricuspid regurgitant velocity is 2.10 m/s, and  with an assumed right atrial pressure of 15 mmHg, the estimated right ventricular systolic pressure is 32.6 mmHg. Left Atrium: Left atrial size was severely dilated. Right Atrium: Right atrial size was severely dilated. Pericardium: There is no evidence of pericardial effusion. Mitral Valve: The mitral valve is normal in structure. Trivial mitral valve regurgitation. No evidence of mitral valve stenosis. Tricuspid Valve: The tricuspid valve is normal in structure. Tricuspid valve regurgitation is trivial. No evidence of tricuspid stenosis. Aortic Valve: The aortic valve is tricuspid. There is mild calcification  of the aortic valve. Aortic valve regurgitation is not visualized. No aortic stenosis is present. Pulmonic Valve: The pulmonic valve was normal in structure. Pulmonic valve regurgitation is not visualized. No evidence of pulmonic stenosis. Aorta: Aortic dilatation noted. There is borderline dilatation of the aortic root, measuring 39 mm. Venous: The inferior vena cava is dilated in size with less than 50% respiratory variability, suggesting right atrial pressure of 15 mmHg. IAS/Shunts: No atrial level shunt detected by color flow Doppler.  LEFT VENTRICLE PLAX 2D LVIDd:         4.80 cm LVIDs:         3.10 cm LV PW:         1.10 cm LV IVS:        1.30 cm LVOT diam:     2.10 cm LV SV:         64 LV SV Index:   31 LVOT Area:     3.46 cm  RIGHT VENTRICLE          IVC RV Basal diam:  3.40 cm  IVC diam: 2.20 cm TAPSE (M-mode): 2.1 cm LEFT ATRIUM              Index        RIGHT ATRIUM           Index LA diam:        4.80 cm  2.35 cm/m   RA Area:     26.90 cm LA Vol (A2C):   121.0 ml 59.11 ml/m  RA Volume:   81.40 ml  39.77 ml/m LA Vol (A4C):   94.4 ml  46.12 ml/m LA Biplane Vol: 110.0 ml 53.74 ml/m  AORTIC VALVE LVOT Vmax:   91.30 cm/s LVOT Vmean:  59.500 cm/s LVOT VTI:    0.186 m  AORTA Ao Root diam: 3.90 cm Ao Asc diam:  3.50 cm TRICUSPID VALVE TR Peak grad:   17.6 mmHg TR Vmax:        210.00 cm/s  SHUNTS Systemic VTI:  0.19 m Systemic Diam: 2.10 cm Jules Oar MD Electronically signed by Jules Oar MD Signature Date/Time: 03/25/2024/5:56:00 PM  Final    MR BRAIN WO CONTRAST Result Date: 03/25/2024 CLINICAL DATA:  68 year old male with altered mental status. EXAM: MRI HEAD WITHOUT CONTRAST TECHNIQUE: Multiplanar, multiecho pulse sequences of the brain and surrounding structures were obtained without intravenous contrast. COMPARISON:  CT head, CTA head and neck this morning. Brain MRI 06/25/2021. FINDINGS: Brain: No restricted diffusion to suggest acute infarction. No midline shift, mass effect,  evidence of mass lesion, ventriculomegaly, extra-axial collection or acute intracranial hemorrhage. Cervicomedullary junction and pituitary are within normal limits. Small chronic right corona radiata lacunar infarct. Small associated right periventricular chronic microhemorrhage. Otherwise largely normal for age gray and white matter signal throughout the brain. Mild additional white matter T2 and FLAIR hyperintensity. However, there is subtle cortical encephalomalacia in the right perirolandic area corresponding to small cortically based infarcts there in 2022. Vascular: Major intracranial vascular flow voids are stable since 2022. Skull and upper cervical spine: Normal for age visible cervical spine. Visualized bone marrow signal is within normal limits. Sinuses/Orbits: Chronic right frontal sinusitis. Stable and otherwise negative. Other: Retained secretions in the nasopharynx. Mastoids remain clear. Negative visible internal auditory structures. IMPRESSION: 1. No acute intracranial abnormality. 2. 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 Electronically Signed   By: Marlise Simpers M.D.   On: 03/25/2024 10:50   CT ANGIO HEAD NECK W WO CM Result Date: 03/25/2024 CLINICAL DATA:  Altered mental status EXAM: CT ANGIOGRAPHY HEAD AND NECK WITH AND WITHOUT CONTRAST TECHNIQUE: Multidetector CT imaging of the head and neck was performed using the standard protocol during bolus administration of intravenous contrast. Multiplanar CT image reconstructions and MIPs were obtained to evaluate the vascular anatomy. Carotid stenosis measurements (when applicable) are obtained utilizing NASCET criteria, using the distal internal carotid diameter as the denominator. RADIATION DOSE REDUCTION: This exam was performed according to the departmental dose-optimization program which includes automated exposure control, adjustment of the mA and/or kV according to patient size and/or use of  iterative reconstruction technique. CONTRAST:  75mL OMNIPAQUE  IOHEXOL  350 MG/ML SOLN COMPARISON:  MRA head 03/25/2024 FINDINGS: CTA NECK FINDINGS Skeleton: No acute abnormality or high grade bony spinal canal stenosis. Other neck: Normal pharynx, larynx and major salivary glands. No cervical lymphadenopathy. Unremarkable thyroid gland. Upper chest: Endotracheal tube tip at the level of the clavicular heads. Aortic arch: There is calcific atherosclerosis of the aortic arch. Conventional 3 vessel aortic branching pattern. RIGHT carotid system: Low-density plaque at the carotid bifurcation extending into the proximal ICA. There is a patent stent in the right ICA. No hemodynamically significant stenosis. LEFT carotid system: Predominantly calcified atherosclerosis at the carotid bifurcation extending into the ICA with less than 50% stenosis. Vertebral arteries: Codominant configuration. Atherosclerotic calcification at both vertebral artery origins causing severe stenosis on the right and mild stenosis on the left. CTA HEAD FINDINGS POSTERIOR CIRCULATION: Normal vertebral artery V4 segments. No proximal occlusion of the anterior or inferior cerebellar arteries. Basilar artery is normal. Superior cerebellar arteries are normal. Posterior cerebral arteries are normal. ANTERIOR CIRCULATION: Atherosclerotic calcification of the internal carotid arteries at the skull base without hemodynamically significant stenosis. Anterior cerebral arteries are normal. Middle cerebral arteries are normal. Venous sinuses: As permitted by contrast timing, patent. Anatomic variants: Fetal origin of the left posterior cerebral artery. Review of the MIP images confirms the above findings. IMPRESSION: 1. No emergent large vessel occlusion. 2. Patent right ICA stent. 3. Severe stenosis of the right vertebral artery origin and mild stenosis of the left  vertebral artery origin. Aortic Atherosclerosis (ICD10-I70.0). Electronically Signed   By:  Juanetta Nordmann M.D.   On: 03/25/2024 03:06   DG Abdomen 1 View Result Date: 03/25/2024 CLINICAL DATA:  OG tube placement EXAM: ABDOMEN - 1 VIEW COMPARISON:  None Available. FINDINGS: Enteric tube tip and side-port in the stomach. IMPRESSION: Satisfactory position of the enteric tube. Electronically Signed   By: Rozell Cornet M.D.   On: 03/25/2024 02:39   DG Chest Portable 1 View Result Date: 03/25/2024 CLINICAL DATA:  Post intubation EXAM: PORTABLE CHEST 1 VIEW COMPARISON:  Radiograph 03/25/2024 at 12:12 a.m. FINDINGS: Endotracheal tube tip in the intrathoracic trachea 5.4 cm from the carina. Subdiaphragmatic enteric tube. Stable cardiomediastinal silhouette. Pulmonary vascular congestion. No focal consolidation, pleural effusion, or pneumothorax. No displaced rib fractures. IMPRESSION: Endotracheal tube tip in the intrathoracic trachea 5.4 cm from the carina. Remainder unchanged from 03/25/2024 at 12:12 a.m. Electronically Signed   By: Rozell Cornet M.D.   On: 03/25/2024 02:39   DG Chest Port 1 View Result Date: 03/25/2024 CLINICAL DATA:  Altered mental status. EXAM: PORTABLE CHEST 1 VIEW COMPARISON:  May 26, 2019 FINDINGS: The heart size and mediastinal contours are within normal limits. There is no evidence of acute infiltrate, pleural effusion or pneumothorax. The visualized skeletal structures are unremarkable. IMPRESSION: No acute cardiopulmonary disease. Electronically Signed   By: Virgle Grime M.D.   On: 03/25/2024 00:25   CT HEAD WO CONTRAST ( ) Result Date: 03/25/2024 CLINICAL DATA:  Altered mental status. EXAM: CT HEAD WITHOUT CONTRAST TECHNIQUE: Contiguous axial images were obtained from the base of the skull through the vertex without intravenous contrast. RADIATION DOSE REDUCTION: This exam was performed according to the departmental dose-optimization program which includes automated exposure control, adjustment of the mA and/or kV according to patient size and/or use of  iterative reconstruction technique. COMPARISON:  May 27, 2021 FINDINGS: Brain: There is generalized cerebral atrophy with widening of the extra-axial spaces and ventricular dilatation. There are areas of decreased attenuation within the white matter tracts of the supratentorial brain, consistent with microvascular disease changes. A tiny calcification is again seen within the posterior aspect of the frontal lobe on the right. Vascular: Moderate to marked severity bilateral cavernous carotid artery calcification is noted. Skull: Normal. Negative for fracture or focal lesion. Sinuses/Orbits: There is moderate severity right-sided frontal sinus mucosal thickening. Other: None. IMPRESSION: 1. Generalized cerebral atrophy with widening of the extra-axial spaces and ventricular dilatation. 2. No acute intracranial abnormality. 3. Moderate severity right-sided frontal sinus disease. Electronically Signed   By: Virgle Grime M.D.   On: 03/25/2024 00:16    Disposition:  Home  Discharge disposition: 01-Home or Self Care       Discharge Instructions     Call MD for:  difficulty breathing, headache or visual disturbances   Complete by: As directed    Call MD for:  persistant dizziness or light-headedness   Complete by: As directed    Call MD for:  persistant nausea and vomiting   Complete by: As directed    Call MD for:  temperature >100.4   Complete by: As directed    Diet - low sodium heart healthy   Complete by: As directed    Increase activity slowly   Complete by: As directed        Allergies as of 03/26/2024       Reactions   Simbrinza [brinzolamide -brimonidine ] Shortness Of Breath, Other (See Comments)   Difficulty breathing  Medication List     TAKE these medications    ALPHAGAN  P OP Place 1 drop into both eyes in the morning and at bedtime.   apixaban  5 MG Tabs tablet Commonly known as: Eliquis  Take 1 tablet (5 mg total) by mouth 2 (two) times daily.    atorvastatin  80 MG tablet Commonly known as: LIPITOR  Take 80 mg by mouth daily at 2 PM.   bimatoprost 0.01 % Soln Commonly known as: LUMIGAN Place 1 drop into both eyes at bedtime.   cetirizine  10 MG tablet Commonly known as: ZYRTEC  Take 10 mg by mouth daily as needed for allergies.   clopidogrel  75 MG tablet Commonly known as: PLAVIX  Take 1 tablet (75 mg total) by mouth daily.   FISH OIL PO Take 1 capsule by mouth daily at 2 PM.   metoprolol  succinate 50 MG 24 hr tablet Commonly known as: TOPROL -XL Take 50 mg by mouth daily.   sildenafil 20 MG tablet Commonly known as: REVATIO Take 20 mg by mouth daily as needed (ED).         Follow-up appointment   With PCP- Dr. Bernida Brink   Discharge Condition:    Stable- home with girlfriend     Time spent in discharge 40 mins     Early Glisson, MSN, AG-ACNP-BC [facility] 03/26/2024, 1:07 PM  See Amion for pager If no response to pager , please call [phone] until 7pm After 7:00 pm call Elink  [phone]

## 2024-03-26 NOTE — TOC Transition Note (Signed)
 Transition of Care [facility]) - Discharge Note   Patient Details  Name: Tyler Burns MRN: [id number] Date of Birth: [birth year]  [facility]) CM/SW Contact:  Jannine Meo, RN Phone Number: 03/26/2024, 10:36 AM   Clinical Narrative:   Outpatient PT referral # [id number], placed for [facility] [address]. Contact information on AVS.    Final next level of care: Home/Self Care Barriers to Discharge: No Barriers Identified   Patient Goals and CMS Choice            Discharge Placement                       Discharge Plan and Services Additional resources added to the After Visit Summary for                                       Social Drivers of Health (SDOH) Interventions SDOH Screenings   Tobacco Use: Medium Risk (11/24/2023)     Readmission Risk Interventions     No data to display

## 2024-03-26 NOTE — Progress Notes (Addendum)
 NAME:  Tyler Burns, MRN:  [id number], DOB:  10/25/1956, LOS: 1 ADMISSION DATE:  03/24/2024, CONSULTATION DATE:  03/25/24 REFERRING MD:  EDP, CHIEF COMPLAINT:  acute ams   History of Present Illness:  68 yo male presents 2/2 ams, acute. Pt was reportedly found by his friends/family severely altered after last being seen at baseline around 1400. All history is obtained from chart and EDP as pt is intubated and sedated at time of my consultation.   Due to his encephalopathy pt has been unable to  lay flat and comply with imaging. In light of this, EDP elected to intubate pt. Neuro has been consulted and recommended MRI per EDP, no note pending at this time. Pt has not had any fever at the time at the time of my consultation.   In addition his BP has remained elevated and was started on titratable cleviprex .   Ccm was asked to admit pt in light of his intubated status as well as titratable cleviprex .   Pertinent  Medical History  H/o cva Asthma Afib Gerd Glaucoma Hyperlipidemia H/o htn  Significant Hospital Events: Including procedures, antibiotic start and stop dates in addition to other pertinent events   Admitted to ICU 5/30 intubated, extubated in the afternoon  Interim History / Subjective:   Patient reported yesterday that he took valium and drank three beers and does not remember much after that.   Objective    Blood pressure (!) 144/68, pulse 60, temperature 98 F (36.7 C), temperature source Oral, resp. rate 14, height 5\' 11"  (1.803 m), weight 84.6 kg, SpO2 (!) 89%.    Vent Mode: PSV;CPAP FiO2 (%):  [40 %] 40 % Set Rate:  [18 bmp] 18 bmp Vt Set:  [600 mL] 600 mL PEEP:  [5 cmH20] 5 cmH20 Pressure Support:  [5 cmH20] 5 cmH20   Intake/Output Summary (Last 24 hours) at 03/26/2024 0825 Last data filed at 03/26/2024 0800 Gross per 24 hour  Intake 120 ml  Output 905 ml  Net -785 ml   Filed Weights   03/25/24 0516  Weight: 84.6 kg    Examination: General:  chronically ill appearing, disheveled intubated and sedated HENT: ncat, perrla, sclera injected Lungs: ctab Cardiovascular: irreg irreg Abdomen: soft nt ndbs+ Extremities: no c/c/e Neuro: sedated and intubated but reportedly previously moving all 4 without deficits GU: deferred.   MRI Brain 03/25/24 1. No acute intracranial abnormality. 2. 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  Echo 03/26/24 LV EF 60-65%. Mild concentric LVH. RV size and systolic function are normal. RVSP estimated 32.61mmHg. LA and RA severely dilated. Mitral valve is normal. Aortic valve is normal. Pulmonic valve is normal. Tricuspid valve is normal.  Resolved problem list   Assessment and Plan  Acute encephalopathy Likely drug induced based on reported benzodiazepine and alcohol use. MRI brain unremarkable. Mentation has returned to baseline. - discussed avoiding concomitant use of benzodiazepines and alcohol  Acute Hypoxemic Respiratory Failure Hx of Asthma - extubated 5/30 - currently on room air  Acute hypertenisve emergency Hx of Hypertension - weaned of cleviprex  yesterday - resume metoprolol   AKI - Monitor - give NS bolus this AM  Atrial Fibrillation - continue metoprolol  - continue eliquis   Polycythemia - likely hemo concentrated - giving IV fluids  Hx of CVA R vertebral artery stenosis, severe, chronic - continue plavix  - continue atorvastatin  80mg   Disposition: - patient is tolerating oral diet - PT evaluation and if stable, will likely  DC home today - have attempted reaching out to significant other for more details   Best Practice (right click and "Reselect all SmartList Selections" daily)   Diet/type: Regular consistency (see orders) DVT prophylaxis DOAC Pressure ulcer(s): N/A GI prophylaxis: N/A Lines: N/A Foley:  N/A Code Status:  full code Last date of multidisciplinary goals of care discussion [updated patient at  bedside today]  Labs   CBC: Recent Labs  Lab 03/24/24 2347 03/25/24 0302 03/25/24 0609 03/26/24 0718  WBC 9.5  --  14.2* 9.7  HGB 18.7* 17.7* 17.7* 18.1*  HCT 55.3* 52.0 52.9* 54.7*  MCV 92.3  --  93.0 93.5  PLT 189  --  194 194    Basic Metabolic Panel: Recent Labs  Lab 03/24/24 2347 03/25/24 0302 03/25/24 0609 03/25/24 0908  NA 139 138  --   --   K 3.7 3.2*  --   --   CL 100  --   --   --   CO2 22  --   --   --   GLUCOSE 106*  --   --   --   BUN 15  --   --   --   CREATININE 1.03  --  1.27*  --   CALCIUM  9.9  --   --   --   MG  --   --   --  2.1   GFR: Estimated Creatinine Clearance: 58.5 mL/min (A) (by C-G formula based on SCr of 1.27 mg/dL (H)). Recent Labs  Lab 03/24/24 2347 03/25/24 0609 03/26/24 0718  WBC 9.5 14.2* 9.7    Liver Function Tests: Recent Labs  Lab 03/24/24 2347  AST 22  ALT 16  ALKPHOS 54  BILITOT 0.9  PROT 8.1  ALBUMIN  4.6   No results for input(s): "LIPASE", "AMYLASE" in the last 168 hours. Recent Labs  Lab 03/25/24 1227  AMMONIA 19    ABG    Component Value Date/Time   PHART 7.333 (L) 03/25/2024 0302   PCO2ART 42.7 03/25/2024 0302   PO2ART 156 (H) 03/25/2024 0302   HCO3 22.9 03/25/2024 0302   TCO2 24 03/25/2024 0302   ACIDBASEDEF 3.0 (H) 03/25/2024 0302   O2SAT 99 03/25/2024 0302     Coagulation Profile: Recent Labs  Lab 03/24/24 2347  INR 1.1    Cardiac Enzymes: No results for input(s): "CKTOTAL", "CKMB", "CKMBINDEX", "TROPONINI" in the last 168 hours.  HbA1C: Hgb A1c MFr Bld  Date/Time Value Ref Range Status  03/25/2024 06:09 AM 5.6 4.8 - 5.6 % Final    Comment:    (NOTE) Diagnosis of Diabetes The following HbA1c ranges recommended by the American Diabetes Association (ADA) may be used as an aid in the diagnosis of diabetes mellitus.  Hemoglobin             Suggested A1C NGSP%              Diagnosis  <5.7                   Non Diabetic  5.7-6.4                Pre-Diabetic  >6.4                    Diabetic  <7.0                   Glycemic control for  adults with diabetes.    05/27/2021 07:39 AM 6.3 (H) 4.8 - 5.6 % Final    Comment:    (NOTE) Pre diabetes:          5.7%-6.4%  Diabetes:              >6.4%  Glycemic control for   <7.0% adults with diabetes     CBG: Recent Labs  Lab 03/25/24 1529 03/25/24 2013 03/25/24 2344 03/26/24 0347 03/26/24 0728  GLUCAP 92 128* 92 128* 110*      Critical care time: n/a    Duaine German, MD [facility] Office: [phone]   See Amion for personal pager PCCM on call pager [phone] until 7pm. Please call Elink 7p-7a. [phone]

## 2024-04-13 ENCOUNTER — Ambulatory Visit: Attending: Physical Therapy | Admitting: Physical Therapy

## 2024-04-13 NOTE — Therapy (Incomplete)
 OUTPATIENT PHYSICAL THERAPY CERVICAL EVALUATION   Patient Name: Tyler Burns MRN: [id number] DOB:1956/10/27, 68 y.o., male Today's Date: 04/13/2024  END OF SESSION:   Past Medical History:  Diagnosis Date   Asthma    Carotid artery occlusion    Dysrhythmia    Afib   GERD (gastroesophageal reflux disease)    Glaucoma    High cholesterol    History of cardiovascular stress test    Myoview  12/19:  EF 50%, No ischemia, Low Risk   Hypertension    Stroke [facility])    August 2022   Past Surgical History:  Procedure Laterality Date   ABDOMINAL AORTIC ANEURYSM REPAIR N/A 05/25/2019   Procedure: ANEURYSM ABDOMINAL AORTIC REPAIR using a HEMASHIELD GOLD VASCULAR GRAFT;  Surgeon: Mayo Speck, MD;  Location: [facility] OR;  Service: Vascular;  Laterality: N/A;   APPENDECTOMY     EYE SURGERY Right    cataract   INGUINAL HERNIA REPAIR Right 06/15/2015   Procedure: RIGHT INGUINAL HERNIA REPAIR WITH MESH;  Surgeon: Adalberto Hollow, MD;  Location: [facility] OR;  Service: General;  Laterality: Right;   INSERTION OF MESH Right 06/15/2015   Procedure: INSERTION OF MESH;  Surgeon: Adalberto Hollow, MD;  Location: [facility] OR;  Service: General;  Laterality: Right;   TRANSCAROTID ARTERY REVASCULARIZATION  Right 06/05/2021   Procedure: RIGHT TRANSCAROTID ARTERY REVASCULARIZATION;  Surgeon: Young Hensen, MD;  Location: [facility] OR;  Service: Vascular;  Laterality: Right;   VASCULAR SURGERY     Patient Active Problem List   Diagnosis Date Noted   Hypertensive emergency 03/25/2024   Glucose intolerance 07/21/2022   Cerebrovascular accident (CVA) (HCC) 06/25/2021   Carotid artery stenosis 06/05/2021   Carotid artery stenosis, symptomatic, right 06/05/2021   Essential hypertension 05/28/2021   Hyperlipidemia 05/28/2021   Cerebral infarction (HCC) 05/27/2021   AAA (abdominal aortic aneurysm) (HCC) 05/25/2019   Atrial fibrillation (HCC) 09/20/2013    PCP: ***  REFERRING PROVIDER: Wilfredo Hanly,  MD  REFERRING DIAG: M54.2 (ICD-10-CM) - Neck pain  THERAPY DIAG:  No diagnosis found.  Rationale for Evaluation and Treatment: Rehabilitation  ONSET DATE: 03/26/2024 (referral date)  SUBJECTIVE:                                                                                                                                                                                                         SUBJECTIVE STATEMENT: ***  Hand dominance: {MISC; OT HAND DOMINANCE:[phone]}  PERTINENT HISTORY:  PMH: asthma, AAA s/p repair in 2020, Afib, HTN, HLD, low back pain, glaucoma, GERD, CVA  PAIN:  Are you having pain? {OPRCPAIN:27236}  PRECAUTIONS: {Therapy precautions:24002}  RED FLAGS: {PT Red Flags:29287}     WEIGHT BEARING RESTRICTIONS: {Yes ***/No:24003}  FALLS:  Has patient fallen in last 6 months? {fallsyesno:27318}  LIVING ENVIRONMENT: Lives with: {OPRC lives with:25569::lives with their family} Lives in: {Lives in:25570} Stairs: {opstairs:27293} Has following equipment at home: {Assistive devices:23999}  OCCUPATION: ***  PLOF: {PLOF:24004}  PATIENT GOALS: ***  NEXT MD VISIT: ***  OBJECTIVE:  Note: Objective measures were completed at Evaluation unless otherwise noted.  DIAGNOSTIC FINDINGS:  ***  MRI Brain 03/25/24 1. No acute intracranial abnormality. 2. 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  PATIENT SURVEYS:  {rehab surveys:24030}  COGNITION: Overall cognitive status: {cognition:24006}  SENSATION: {sensation:27233}  POSTURE: {posture:25561}  PALPATION: ***   CERVICAL ROM:   {AROM/PROM:27142} ROM A/PROM (deg) eval  Flexion   Extension   Right lateral flexion   Left lateral flexion   Right rotation   Left rotation    (Blank rows = not tested)  UPPER EXTREMITY ROM:  {AROM/PROM:27142} ROM Right eval Left eval  Shoulder flexion    Shoulder extension    Shoulder abduction     Shoulder adduction    Shoulder extension    Shoulder internal rotation    Shoulder external rotation    Elbow flexion    Elbow extension    Wrist flexion    Wrist extension    Wrist ulnar deviation    Wrist radial deviation    Wrist pronation    Wrist supination     (Blank rows = not tested)  UPPER EXTREMITY MMT:  MMT Right eval Left eval  Shoulder flexion    Shoulder extension    Shoulder abduction    Shoulder adduction    Shoulder extension    Shoulder internal rotation    Shoulder external rotation    Middle trapezius    Lower trapezius    Elbow flexion    Elbow extension    Wrist flexion    Wrist extension    Wrist ulnar deviation    Wrist radial deviation    Wrist pronation    Wrist supination    Grip strength     (Blank rows = not tested)  CERVICAL SPECIAL TESTS:  {Cervical special tests:25246}  FUNCTIONAL TESTS:  {Functional tests:24029}  TREATMENT: PT Evaluation  ***                                                                                                                                 PATIENT EDUCATION:  Education details: *** Person educated: {Person educated:25204} Education method: {Education Method:25205} Education comprehension: {Education Comprehension:25206}  HOME EXERCISE PROGRAM: ***  ASSESSMENT:  CLINICAL IMPRESSION: Patient is a *** year old *** referred to Neuro OPPT for***.   Pt's PMH is significant for: *** The following deficits were present during the exam: ***. Based on ***, pt is  an incr risk for falls. Pt would benefit from skilled PT to address these impairments and functional limitations to maximize functional mobility independence.   OBJECTIVE IMPAIRMENTS: {opptimpairments:25111}.   ACTIVITY LIMITATIONS: {activitylimitations:27494}  PARTICIPATION LIMITATIONS: {participationrestrictions:25113}  PERSONAL FACTORS: {Personal factors:25162} are also affecting patient's functional outcome.   REHAB POTENTIAL:  {rehabpotential:25112}  CLINICAL DECISION MAKING: {clinical decision making:25114}  EVALUATION COMPLEXITY: {Evaluation complexity:25115}   GOALS: Goals reviewed with patient? {yes/no:20286}  SHORT TERM GOALS: Target date: ***  *** Baseline:  Goal status: INITIAL  2.  *** Baseline:  Goal status: INITIAL  3.  *** Baseline:  Goal status: INITIAL  4.  *** Baseline:  Goal status: INITIAL  5.  *** Baseline:  Goal status: INITIAL  6.  *** Baseline:  Goal status: INITIAL  LONG TERM GOALS: Target date: ***  *** Baseline:  Goal status: INITIAL  2.  *** Baseline:  Goal status: INITIAL  3.  *** Baseline:  Goal status: INITIAL  4.  *** Baseline:  Goal status: INITIAL  5.  *** Baseline:  Goal status: INITIAL  6.  *** Baseline:  Goal status: INITIAL   PLAN:  PT FREQUENCY: {rehab frequency:25116}  PT DURATION: {rehab duration:25117}  PLANNED INTERVENTIONS: {rehab planned interventions:25118::97110-Therapeutic exercises,97530- Therapeutic [phone]- Neuromuscular re-education,97535- Self HQIO,[id number]- Manual therapy}  PLAN FOR NEXT SESSION: ***   Lorita Rosa, PT Lorita Rosa, PT, DPT, CSRS  04/13/2024, 8:10 AM

## 2024-04-26 ENCOUNTER — Ambulatory Visit: Attending: Pulmonary Disease | Admitting: Physical Therapy

## 2024-04-26 DIAGNOSIS — M6281 Muscle weakness (generalized): Secondary | ICD-10-CM | POA: Diagnosis present

## 2024-04-26 DIAGNOSIS — M542 Cervicalgia: Secondary | ICD-10-CM | POA: Diagnosis present

## 2024-04-26 DIAGNOSIS — M25512 Pain in left shoulder: Secondary | ICD-10-CM | POA: Insufficient documentation

## 2024-04-26 DIAGNOSIS — G8929 Other chronic pain: Secondary | ICD-10-CM | POA: Diagnosis present

## 2024-04-26 NOTE — Therapy (Signed)
 OUTPATIENT PHYSICAL THERAPY CERVICAL EVALUATION   Patient Name: LEBERT LOVERN MRN: [id number] DOB:12-Feb-1956, 68 y.o., male Today's Date: 04/26/2024  END OF SESSION:  PT End of Session - 04/26/24 0853     Visit Number 1    Number of Visits 7   with eval   Date for PT Re-Evaluation 06/21/24   to allow for scheduling delays   Authorization Type BCBS    PT Start Time [phone]   pt arrived late   PT Stop Time 0926    PT Time Calculation (min) 36 min    Activity Tolerance Patient tolerated treatment well    Behavior During Therapy [facility] for tasks assessed/performed          Past Medical History:  Diagnosis Date   Asthma    Carotid artery occlusion    Dysrhythmia    Afib   GERD (gastroesophageal reflux disease)    Glaucoma    High cholesterol    History of cardiovascular stress test    Myoview  12/19:  EF 50%, No ischemia, Low Risk   Hypertension    Stroke [facility])    August 2022   Past Surgical History:  Procedure Laterality Date   ABDOMINAL AORTIC ANEURYSM REPAIR N/A 05/25/2019   Procedure: ANEURYSM ABDOMINAL AORTIC REPAIR using a HEMASHIELD GOLD VASCULAR GRAFT;  Surgeon: Oris Krystal FALCON, MD;  Location: [facility] OR;  Service: Vascular;  Laterality: N/A;   APPENDECTOMY     EYE SURGERY Right    cataract   INGUINAL HERNIA REPAIR Right 06/15/2015   Procedure: RIGHT INGUINAL HERNIA REPAIR WITH MESH;  Surgeon: Krystal Russell, MD;  Location: [facility] OR;  Service: General;  Laterality: Right;   INSERTION OF MESH Right 06/15/2015   Procedure: INSERTION OF MESH;  Surgeon: Krystal Russell, MD;  Location: [facility] OR;  Service: General;  Laterality: Right;   TRANSCAROTID ARTERY REVASCULARIZATION  Right 06/05/2021   Procedure: RIGHT TRANSCAROTID ARTERY REVASCULARIZATION;  Surgeon: Gretta Lonni PARAS, MD;  Location: [facility] OR;  Service: Vascular;  Laterality: Right;   VASCULAR SURGERY     Patient Active Problem List   Diagnosis Date Noted   Hypertensive emergency 03/25/2024   Glucose intolerance 07/21/2022    Cerebrovascular accident (CVA) (HCC) 06/25/2021   Carotid artery stenosis 06/05/2021   Carotid artery stenosis, symptomatic, right 06/05/2021   Essential hypertension 05/28/2021   Hyperlipidemia 05/28/2021   Cerebral infarction (HCC) 05/27/2021   AAA (abdominal aortic aneurysm) (HCC) 05/25/2019   Atrial fibrillation (HCC) 09/20/2013    PCP: Norval Kettle, MD  REFERRING PROVIDER: Kara Dorn NOVAK, MD  REFERRING DIAG: M54.2 (ICD-10-CM) - Neck pain  THERAPY DIAG:  Muscle weakness (generalized)  Cervicalgia  Chronic left shoulder pain  Rationale for Evaluation and Treatment: Rehabilitation  ONSET DATE: 03/26/2024  SUBJECTIVE:  SUBJECTIVE STATEMENT:  Pt reports that he had a stroke a few years ago (2022) and that ever since then he has had a pain down the L back side of his neck down into his L shoulder blade. Pt reports that it feels like his muscles are locked up and it feels like someone is squeezing his shoulder and pushing down. Pt reports that this sensation is constant, it does get worse if he tries to use his L arm or reach for something. He will also get headaches if he does a lot of activity during the day, right at the base of his skull.  He did see PT before to try and address this issue, went 4-5 times. However, he reports that he went home hurting 3-4 times worse than before therapy because the exercises they were giving him flared his pain up even more.  Pt interested in trying PT again to try to reduce his pain.   Hand dominance: Right  PERTINENT HISTORY:  PMH: asthma, AAA s/p repair in 2020, Afib, HTN, low back pain, glaucoma, CVA (2022)  PAIN:  Are you having pain? Yes: NPRS scale: not rated Pain location: L back side of neck into L shoulder blade Pain  description: locking up, gripping, squeezing Aggravating factors: reaching, using L arm for anything Relieving factors: rotating my arms - keeps it from locking up but doesn't relieve the pain  PRECAUTIONS: None  RED FLAGS: None    WEIGHT BEARING RESTRICTIONS: No  FALLS:  Has patient fallen in last 6 months? No  LIVING ENVIRONMENT: Lives with: lives alone, girlfriend  OCCUPATION: retired  PLOF: Independent  PATIENT GOALS: see if you can't get that pulled muscle out  NEXT MD VISIT: Nothing currently scheduled  OBJECTIVE:  Note: Objective measures were completed at Evaluation unless otherwise noted.  DIAGNOSTIC FINDINGS:   MRI Brain 03/25/24 1. No acute intracranial abnormality. 2. 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  CT Angio Head/Neck 03/25/2024 IMPRESSION: 1. No emergent large vessel occlusion. 2. Patent right ICA stent. 3. 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   Aortic Atherosclerosis (ICD10-I70.0).  PATIENT SURVEYS:  NDI:  NECK DISABILITY INDEX  Date: 04/26/2024 Score  Pain intensity 1 = The pain is very mild at the moment  2. Personal care (washing, dressing, etc.) 1 =  I can look after myself normally but it causes extra pain  3. Lifting 3 = Pain prevents me from lifting heavy weights but I can manage light to medium   weights if they are conveniently positioned  4. Reading 2 =  I can read as much as I want with moderate pain in my neck  5. Headaches 2 =  I have moderate headaches, which come infrequently  6. Concentration 0 =  I can concentrate fully when I want to with no difficulty  7. Work 3 =  I cannot do my usual work  8. Driving 2 =  I can drive my car as long as I want with moderate pain in my neck  9. Sleeping 2 = My sleep is mildly disturbed (1-2 hrs sleepless)  10. Recreation 5 = I can't do any recreation activities at  all  Total 21/50   Minimum Detectable Change (90% confidence): 5 points or 10% points  SPADI: 40/130, 30.8% impaired  COGNITION: Overall cognitive status: Within functional limits for tasks assessed  SENSATION: No N/T down  L side But L ear has been numb since his stroke  POSTURE: rounded shoulders, forward head, and increased thoracic kyphosis  PALPATION: Tightness in suboccipitals, cervical paraspinals, upper trap (L>R), palpable trigger points in B UE (L>R)  CERVICAL ROM:   Active ROM A/PROM (deg) eval  Pain  Flexion 40 -  Extension 33 Pain on L side  Right lateral flexion 45 Pain on L side  Left lateral flexion 25 Tension on L side  Right rotation 60 Pain at end range  Left rotation 37 Pain at end range   (Blank rows = not tested)  UPPER EXTREMITY ROM:  Active ROM Right eval Left eval  Shoulder flexion Limited to about 90 degrees Limited to about 90 degrees  Shoulder extension    Shoulder abduction Limited at end range Limited at end range, pain in L shoulder  Shoulder adduction    Shoulder extension    Shoulder internal rotation    Shoulder external rotation    Elbow flexion    Elbow extension    Wrist flexion    Wrist extension    Wrist ulnar deviation    Wrist radial deviation    Wrist pronation    Wrist supination     (Blank rows = not tested)  UPPER EXTREMITY MMT:  MMT Right eval Left eval  Shoulder flexion 3 3  Shoulder extension    Shoulder abduction 4 4  Shoulder adduction    Shoulder extension    Shoulder internal rotation    Shoulder external rotation    Middle trapezius    Lower trapezius    Elbow flexion    Elbow extension    Wrist flexion    Wrist extension    Wrist ulnar deviation    Wrist radial deviation    Wrist pronation    Wrist supination    Grip strength     (Blank rows = not tested)  CERVICAL SPECIAL TESTS:  None performed due to time constraints  TREATMENT: PT Evaluation  TherEx To address tightness in  cervical and upper shoulder muscles: Seated UT stretch 3 x 30 sec L side Seated levator scap stretch 3 x 30 sec L side  Added to HEP, see bolded below                                                                                                                               PATIENT EDUCATION:  Education details: Eval findings, PT POC, initial HEP Person educated: Patient Education method: Medical illustrator Education comprehension: verbalized understanding, returned demonstration, and needs further education  HOME EXERCISE PROGRAM: Emailed to patient 04/26/2024 Access Code: [email or web address] URL: [email or web address] Date: 04/26/2024 Prepared by: Waddell Southgate  Exercises - Seated Upper Trapezius Stretch  - 1 x daily - 7 x weekly - 1 sets - 3-5 reps - 30-60 sec hold - Seated Levator Scapulae Stretch  - 1 x daily - 7 x weekly - 1 sets - 3-5  reps - 30-60 sec hold  ASSESSMENT:  CLINICAL IMPRESSION: Patient is a 68 year old male referred to Neuro OPPT for chronic neck pain.   Pt's PMH is significant for: asthma, AAA s/p repair in 2020, Afib, HTN, low back pain, glaucoma, CVA (2022). The following deficits were present during the exam: decreased cervical AROM, decreased shoulder AROM, decreased UE strength, increased pain, and decreased ability to perform ADLs without increase in pain. Pt would benefit from skilled PT to address these impairments and functional limitations to maximize functional mobility independence.   OBJECTIVE IMPAIRMENTS: decreased activity tolerance, decreased knowledge of condition, decreased mobility, decreased ROM, decreased strength, impaired UE functional use, improper body mechanics, postural dysfunction, and pain.   ACTIVITY LIMITATIONS: carrying, lifting, bending, and reach over head  PARTICIPATION LIMITATIONS: cleaning, community activity, and yard work  PERSONAL FACTORS: Age, Past/current experiences, Time since onset of  injury/illness/exacerbation, and 3+ comorbidities:  asthma, AAA s/p repair in 2020, Afib, HTN, low back pain, glaucoma, CVA (2022) are also affecting patient's functional outcome.   REHAB POTENTIAL: Good  CLINICAL DECISION MAKING: Stable/uncomplicated  EVALUATION COMPLEXITY: Low   GOALS: Goals reviewed with patient? Yes  SHORT TERM GOALS: Target date: 05/17/2024   Pt will be independent with initial HEP for improved cervical ROM, improved posture and management of pain symptoms in order to build upon functional gains made in therapy. Baseline:  Goal status: INITIAL   LONG TERM GOALS: Target date: 06/07/2024   Pt will be independent with final HEP for improved cervical ROM, improved posture and management of pain symptoms in order to build upon functional gains made in therapy. Baseline:  Goal status: INITIAL  2.  Pt will improve his score on the NDI to </= 16/50 to demonstrate decreased pain and improved function. Baseline: 21/50 (7/1) Goal status: INITIAL  3.  Pt will improve his score on the SPADI to </= 27 points and/or 20.8% to demonstrate decreased pain and improved function. Baseline: 40 points and/or 30.8% (7/1) Goal status: INITIAL  4.  Pt will increase his cervical AROM by >/= 10 degrees in limited motions for improved function. Baseline: see eval Goal status: INITIAL  5.  Pt will increase his shoulder AROM to WNL for improved function. Baseline: see eval Goal status: INITIAL  6.  Pt will increase his B shoulder strength by one muscle grade to demonstrate improved function. Baseline: see eval Goal status: INITIAL   PLAN:  PT FREQUENCY: 1x/week  PT DURATION: 6 weeks  PLANNED INTERVENTIONS: [id number]- PT Re-evaluation, 97750- Physical Performance Testing, 97110-Therapeutic exercises, 97530- Therapeutic activity, [id number]- Neuromuscular re-education, 97535- Self Care, [id number]- Manual therapy, [id number]- Aquatic Therapy, [phone]- Electrical stimulation (manual),  Patient/Family education, Taping, Joint mobilization, Spinal mobilization, Cryotherapy, and Moist heat  PLAN FOR NEXT SESSION: how are initial two stretches? Add to HEP, manual therapy (cupping, IASTM), shoulder PROM with progression to dowel exercises, L scapular mobility   Waddell Southgate, PT Waddell Southgate, PT, DPT, CSRS  04/26/2024, 9:44 AM

## 2024-05-06 ENCOUNTER — Ambulatory Visit: Admitting: Physical Therapy

## 2024-05-06 DIAGNOSIS — M542 Cervicalgia: Secondary | ICD-10-CM

## 2024-05-06 DIAGNOSIS — M6281 Muscle weakness (generalized): Secondary | ICD-10-CM | POA: Diagnosis not present

## 2024-05-06 DIAGNOSIS — G8929 Other chronic pain: Secondary | ICD-10-CM

## 2024-05-06 NOTE — Therapy (Signed)
 OUTPATIENT PHYSICAL THERAPY CERVICAL TREATMENT   Patient Name: Tyler Burns MRN: [id number] DOB:1956/02/26, 68 y.o., male Today's Date: 05/06/2024  END OF SESSION:  PT End of Session - 05/06/24 1020     Visit Number 2    Number of Visits 7   with eval   Date for PT Re-Evaluation 06/21/24   to allow for scheduling delays   Authorization Type BCBS    PT Start Time 1020    PT Stop Time 1100    PT Time Calculation (min) 40 min    Activity Tolerance Patient tolerated treatment well    Behavior During Therapy [facility] for tasks assessed/performed           Past Medical History:  Diagnosis Date   Asthma    Carotid artery occlusion    Dysrhythmia    Afib   GERD (gastroesophageal reflux disease)    Glaucoma    High cholesterol    History of cardiovascular stress test    Myoview  12/19:  EF 50%, No ischemia, Low Risk   Hypertension    Stroke [facility])    August 2022   Past Surgical History:  Procedure Laterality Date   ABDOMINAL AORTIC ANEURYSM REPAIR N/A 05/25/2019   Procedure: ANEURYSM ABDOMINAL AORTIC REPAIR using a HEMASHIELD GOLD VASCULAR GRAFT;  Surgeon: Oris Krystal FALCON, MD;  Location: [facility] OR;  Service: Vascular;  Laterality: N/A;   APPENDECTOMY     EYE SURGERY Right    cataract   INGUINAL HERNIA REPAIR Right 06/15/2015   Procedure: RIGHT INGUINAL HERNIA REPAIR WITH MESH;  Surgeon: Krystal Russell, MD;  Location: [facility] OR;  Service: General;  Laterality: Right;   INSERTION OF MESH Right 06/15/2015   Procedure: INSERTION OF MESH;  Surgeon: Krystal Russell, MD;  Location: [facility] OR;  Service: General;  Laterality: Right;   TRANSCAROTID ARTERY REVASCULARIZATION  Right 06/05/2021   Procedure: RIGHT TRANSCAROTID ARTERY REVASCULARIZATION;  Surgeon: Gretta Lonni PARAS, MD;  Location: [facility] OR;  Service: Vascular;  Laterality: Right;   VASCULAR SURGERY     Patient Active Problem List   Diagnosis Date Noted   Hypertensive emergency 03/25/2024   Glucose intolerance 07/21/2022    Cerebrovascular accident (CVA) (HCC) 06/25/2021   Carotid artery stenosis 06/05/2021   Carotid artery stenosis, symptomatic, right 06/05/2021   Essential hypertension 05/28/2021   Hyperlipidemia 05/28/2021   Cerebral infarction (HCC) 05/27/2021   AAA (abdominal aortic aneurysm) (HCC) 05/25/2019   Atrial fibrillation (HCC) 09/20/2013    PCP: Norval Kettle, MD  REFERRING PROVIDER: Kara Dorn NOVAK, MD  REFERRING DIAG: M54.2 (ICD-10-CM) - Neck pain  THERAPY DIAG:  Muscle weakness (generalized)  Cervicalgia  Chronic left shoulder pain  Rationale for Evaluation and Treatment: Rehabilitation  ONSET DATE: 03/26/2024  SUBJECTIVE:  SUBJECTIVE STATEMENT: Pt reports that his pain is about the same, it doesn't ever change unless it gets worse. Pt didn't really notice a difference in his muscle pain and tightness with the exercises from last visit.  From Eval: Pt reports that he had a stroke a few years ago (2022) and that ever since then he has had a pain down the L back side of his neck down into his L shoulder blade. Pt reports that it feels like his muscles are locked up and it feels like someone is squeezing his shoulder and pushing down. Pt reports that this sensation is constant, it does get worse if he tries to use his L arm or reach for something. He will also get headaches if he does a lot of activity during the day, right at the base of his skull.  He did see PT before to try and address this issue, went 4-5 times. However, he reports that he went home hurting 3-4 times worse than before therapy because the exercises they were giving him flared his pain up even more.  Pt interested in trying PT again to try to reduce his pain.   Hand dominance: Right  PERTINENT HISTORY:  PMH:  asthma, AAA s/p repair in 2020, Afib, HTN, low back pain, glaucoma, CVA (2022)  PAIN:  Are you having pain? Yes: NPRS scale: 5/10 Pain location: L back side of neck into L shoulder blade Pain description: locking up, gripping, squeezing Aggravating factors: reaching, using L arm for anything Relieving factors: rotating my arms - keeps it from locking up but doesn't relieve the pain  PRECAUTIONS: None  RED FLAGS: None    WEIGHT BEARING RESTRICTIONS: No  FALLS:  Has patient fallen in last 6 months? No  LIVING ENVIRONMENT: Lives with: lives alone, girlfriend  OCCUPATION: retired  PLOF: Independent  PATIENT GOALS: see if you can't get that pulled muscle out  NEXT MD VISIT: Nothing currently scheduled  OBJECTIVE:  Note: Objective measures were completed at Evaluation unless otherwise noted.  DIAGNOSTIC FINDINGS:   MRI Brain 03/25/24 1. No acute intracranial abnormality. 2. 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  CT Angio Head/Neck 03/25/2024 IMPRESSION: 1. No emergent large vessel occlusion. 2. Patent right ICA stent. 3. 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   Aortic Atherosclerosis (ICD10-I70.0).  PATIENT SURVEYS:  NDI:  NECK DISABILITY INDEX  Date: 04/26/2024 Score  Pain intensity 1 = The pain is very mild at the moment  2. Personal care (washing, dressing, etc.) 1 =  I can look after myself normally but it causes extra pain  3. Lifting 3 = Pain prevents me from lifting heavy weights but I can manage light to medium   weights if they are conveniently positioned  4. Reading 2 =  I can read as much as I want with moderate pain in my neck  5. Headaches 2 =  I have moderate headaches, which come infrequently  6. Concentration 0 =  I can concentrate fully when I want to with no difficulty  7. Work 3 =  I cannot do my usual work  8. Driving 2 =   I can drive my car as long as I want with moderate pain in my neck  9. Sleeping 2 = My sleep is mildly disturbed (1-2 hrs sleepless)  10. Recreation 5 = I can't do any recreation activities at all  Total 21/50   Minimum Detectable Change (90% confidence): 5 points or 10% points  SPADI: 40/130, 30.8% impaired  COGNITION: Overall cognitive status: Within functional limits for tasks assessed  SENSATION: No N/T down L side But L ear has been numb since his stroke  POSTURE: rounded shoulders, forward head, and increased thoracic kyphosis  PALPATION: Tightness in suboccipitals, cervical paraspinals, upper trap (L>R), palpable trigger points in B UE (L>R)  CERVICAL ROM:   Active ROM A/PROM (deg) eval  Pain  Flexion 40 -  Extension 33 Pain on L side  Right lateral flexion 45 Pain on L side  Left lateral flexion 25 Tension on L side  Right rotation 60 Pain at end range  Left rotation 37 Pain at end range   (Blank rows = not tested)  UPPER EXTREMITY ROM:  Active ROM Right eval Left eval  Shoulder flexion Limited to about 90 degrees Limited to about 90 degrees  Shoulder extension    Shoulder abduction Limited at end range Limited at end range, pain in L shoulder  Shoulder adduction    Shoulder extension    Shoulder internal rotation    Shoulder external rotation    Elbow flexion    Elbow extension    Wrist flexion    Wrist extension    Wrist ulnar deviation    Wrist radial deviation    Wrist pronation    Wrist supination     (Blank rows = not tested)  UPPER EXTREMITY MMT:  MMT Right eval Left eval  Shoulder flexion 3 3  Shoulder extension    Shoulder abduction 4 4  Shoulder adduction    Shoulder extension    Shoulder internal rotation    Shoulder external rotation    Middle trapezius    Lower trapezius    Elbow flexion    Elbow extension    Wrist flexion    Wrist extension    Wrist ulnar deviation    Wrist radial deviation    Wrist pronation    Wrist  supination    Grip strength     (Blank rows = not tested)  CERVICAL SPECIAL TESTS:  None performed due to time constraints  TREATMENT:   Manual Therapy While patient seated in arm chair: IASTM to L cervical paraspinals and L UT region to address muscle pain and tightness. Educated patient that redness and bruising are possible in this area following treatment as well as increased soreness initially that should improve with continued stretching and mobility of this region. TPR and MRF to L UT, L levator scap, and L cervical paraspinals to address trigger points in this region  While patient in supine on mat table: Suboccipital release 5 x 30-45 sec each L lateral cervical flexion PROM stretch 4 x 30 sec each L cervical flexion and rotation PROM stretch 4 x 30 sec each Trial of  Chirp Wheel XR x 5 min Pt unsure if this is helping at all  PATIENT EDUCATION:  Education details: continue HEP Person educated: Patient Education method: Medical illustrator Education comprehension: verbalized understanding, returned demonstration, and needs further education  HOME EXERCISE PROGRAM: Emailed to patient 04/26/2024 Access Code: [email or web address] URL: [email or web address] Date: 04/26/2024 Prepared by: Waddell Southgate  Exercises - Seated Upper Trapezius Stretch  - 1 x daily - 7 x weekly - 1 sets - 3-5 reps - 30-60 sec hold - Seated Levator Scapulae Stretch  - 1 x daily - 7 x weekly - 1 sets - 3-5 reps - 30-60 sec hold  ASSESSMENT:  CLINICAL IMPRESSION: Emphasis of skilled PT session on performing manual therapy via IASTM, suboccipital release, TPR, MFR, and PROM stretches to address muscle pain and tightness. Pt reports some relief of his pain following treatment this date. Encouraged him to continue to work on his stretches at home following treatment this  date. He continues to benefit from skilled PT services to work on increased independence with management of his pain symptoms. Continue POC.    OBJECTIVE IMPAIRMENTS: decreased activity tolerance, decreased knowledge of condition, decreased mobility, decreased ROM, decreased strength, impaired UE functional use, improper body mechanics, postural dysfunction, and pain.   ACTIVITY LIMITATIONS: carrying, lifting, bending, and reach over head  PARTICIPATION LIMITATIONS: cleaning, community activity, and yard work  PERSONAL FACTORS: Age, Past/current experiences, Time since onset of injury/illness/exacerbation, and 3+ comorbidities:  asthma, AAA s/p repair in 2020, Afib, HTN, low back pain, glaucoma, CVA (2022) are also affecting patient's functional outcome.   REHAB POTENTIAL: Good  CLINICAL DECISION MAKING: Stable/uncomplicated  EVALUATION COMPLEXITY: Low   GOALS: Goals reviewed with patient? Yes  SHORT TERM GOALS: Target date: 05/17/2024   Pt will be independent with initial HEP for improved cervical ROM, improved posture and management of pain symptoms in order to build upon functional gains made in therapy. Baseline:  Goal status: INITIAL   LONG TERM GOALS: Target date: 06/07/2024   Pt will be independent with final HEP for improved cervical ROM, improved posture and management of pain symptoms in order to build upon functional gains made in therapy. Baseline:  Goal status: INITIAL  2.  Pt will improve his score on the NDI to </= 16/50 to demonstrate decreased pain and improved function. Baseline: 21/50 (7/1) Goal status: INITIAL  3.  Pt will improve his score on the SPADI to </= 27 points and/or 20.8% to demonstrate decreased pain and improved function. Baseline: 40 points and/or 30.8% (7/1) Goal status: INITIAL  4.  Pt will increase his cervical AROM by >/= 10 degrees in limited motions for improved function. Baseline: see eval Goal status: INITIAL  5.  Pt will  increase his shoulder AROM to WNL for improved function. Baseline: see eval Goal status: INITIAL  6.  Pt will increase his B shoulder strength by one muscle grade to demonstrate improved function. Baseline: see eval Goal status: INITIAL   PLAN:  PT FREQUENCY: 1x/week  PT DURATION: 6 weeks  PLANNED INTERVENTIONS: 97164- PT Re-evaluation, 97750- Physical Performance Testing, 97110-Therapeutic exercises, 97530- Therapeutic activity, [id number]- Neuromuscular re-education, 97535- Self Care, [id number]- Manual therapy, [id number]- Aquatic Therapy, [phone]- Electrical stimulation (manual), Patient/Family education, Taping, Joint mobilization, Spinal mobilization, Cryotherapy, and Moist heat  PLAN FOR NEXT SESSION: how are initial two stretches? Add to HEP, manual therapy (cupping, IASTM), shoulder PROM with progression to dowel exercises, L scapular mobility   Waddell Southgate, PT Waddell Southgate, PT, DPT, CSRS  05/06/2024, 11:02 AM

## 2024-05-13 ENCOUNTER — Encounter: Payer: Self-pay | Admitting: Advanced Practice Midwife

## 2024-05-13 ENCOUNTER — Ambulatory Visit: Admitting: Physical Therapy

## 2024-05-19 ENCOUNTER — Ambulatory Visit: Admitting: Physical Therapy

## 2024-05-19 NOTE — Therapy (Incomplete)
 OUTPATIENT PHYSICAL THERAPY CERVICAL TREATMENT   Patient Name: Tyler Burns MRN: [id number] DOB:02/04/1956, 68 y.o., male Today's Date: 05/19/2024  END OF SESSION:     Past Medical History:  Diagnosis Date   Asthma    Carotid artery occlusion    Dysrhythmia    Afib   GERD (gastroesophageal reflux disease)    Glaucoma    High cholesterol    History of cardiovascular stress test    Myoview  12/19:  EF 50%, No ischemia, Low Risk   Hypertension    Stroke [facility])    August 2022   Past Surgical History:  Procedure Laterality Date   ABDOMINAL AORTIC ANEURYSM REPAIR N/A 05/25/2019   Procedure: ANEURYSM ABDOMINAL AORTIC REPAIR using a HEMASHIELD GOLD VASCULAR GRAFT;  Surgeon: Oris Krystal FALCON, MD;  Location: [facility] OR;  Service: Vascular;  Laterality: N/A;   APPENDECTOMY     EYE SURGERY Right    cataract   INGUINAL HERNIA REPAIR Right 06/15/2015   Procedure: RIGHT INGUINAL HERNIA REPAIR WITH MESH;  Surgeon: Krystal Russell, MD;  Location: [facility] OR;  Service: General;  Laterality: Right;   INSERTION OF MESH Right 06/15/2015   Procedure: INSERTION OF MESH;  Surgeon: Krystal Russell, MD;  Location: [facility] OR;  Service: General;  Laterality: Right;   TRANSCAROTID ARTERY REVASCULARIZATION  Right 06/05/2021   Procedure: RIGHT TRANSCAROTID ARTERY REVASCULARIZATION;  Surgeon: Gretta Lonni PARAS, MD;  Location: [facility] OR;  Service: Vascular;  Laterality: Right;   VASCULAR SURGERY     Patient Active Problem List   Diagnosis Date Noted   Hypertensive emergency 03/25/2024   Glucose intolerance 07/21/2022   Cerebrovascular accident (CVA) (HCC) 06/25/2021   Carotid artery stenosis 06/05/2021   Carotid artery stenosis, symptomatic, right 06/05/2021   Essential hypertension 05/28/2021   Hyperlipidemia 05/28/2021   Cerebral infarction (HCC) 05/27/2021   AAA (abdominal aortic aneurysm) (HCC) 05/25/2019   Atrial fibrillation (HCC) 09/20/2013    PCP: Norval Kettle, MD  REFERRING PROVIDER: Kara Dorn NOVAK, MD  REFERRING DIAG: M54.2 (ICD-10-CM) - Neck pain  THERAPY DIAG:  No diagnosis found.  Rationale for Evaluation and Treatment: Rehabilitation  ONSET DATE: 03/26/2024  SUBJECTIVE:                                                                                                                                                                                                         SUBJECTIVE STATEMENT: Pt reports that his pain is about the same, it doesn't ever change unless it gets worse. Pt didn't really notice a difference in his muscle pain and  tightness with the exercises from last visit.  ***  From Eval: Pt reports that he had a stroke a few years ago (2022) and that ever since then he has had a pain down the L back side of his neck down into his L shoulder blade. Pt reports that it feels like his muscles are locked up and it feels like someone is squeezing his shoulder and pushing down. Pt reports that this sensation is constant, it does get worse if he tries to use his L arm or reach for something. He will also get headaches if he does a lot of activity during the day, right at the base of his skull.  He did see PT before to try and address this issue, went 4-5 times. However, he reports that he went home hurting 3-4 times worse than before therapy because the exercises they were giving him flared his pain up even more.  Pt interested in trying PT again to try to reduce his pain.   Hand dominance: Right  PERTINENT HISTORY:  PMH: asthma, AAA s/p repair in 2020, Afib, HTN, low back pain, glaucoma, CVA (2022)  PAIN:  Are you having pain? Yes: NPRS scale: 5/10 Pain location: L back side of neck into L shoulder blade Pain description: locking up, gripping, squeezing Aggravating factors: reaching, using L arm for anything Relieving factors: rotating my arms - keeps it from locking up but doesn't relieve the pain  PRECAUTIONS: None  RED FLAGS: None    WEIGHT BEARING  RESTRICTIONS: No  FALLS:  Has patient fallen in last 6 months? No  LIVING ENVIRONMENT: Lives with: lives alone, girlfriend  OCCUPATION: retired  PLOF: Independent  PATIENT GOALS: see if you can't get that pulled muscle out  NEXT MD VISIT: Nothing currently scheduled  OBJECTIVE:  Note: Objective measures were completed at Evaluation unless otherwise noted.  DIAGNOSTIC FINDINGS:   MRI Brain 03/25/24 1. No acute intracranial abnormality. 2. 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  CT Angio Head/Neck 03/25/2024 IMPRESSION: 1. No emergent large vessel occlusion. 2. Patent right ICA stent. 3. 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   Aortic Atherosclerosis (ICD10-I70.0).  PATIENT SURVEYS:  NDI:  NECK DISABILITY INDEX  Date: 04/26/2024 Score  Pain intensity 1 = The pain is very mild at the moment  2. Personal care (washing, dressing, etc.) 1 =  I can look after myself normally but it causes extra pain  3. Lifting 3 = Pain prevents me from lifting heavy weights but I can manage light to medium   weights if they are conveniently positioned  4. Reading 2 =  I can read as much as I want with moderate pain in my neck  5. Headaches 2 =  I have moderate headaches, which come infrequently  6. Concentration 0 =  I can concentrate fully when I want to with no difficulty  7. Work 3 =  I cannot do my usual work  8. Driving 2 =  I can drive my car as long as I want with moderate pain in my neck  9. Sleeping 2 = My sleep is mildly disturbed (1-2 hrs sleepless)  10. Recreation 5 = I can't do any recreation activities at all  Total 21/50   Minimum Detectable Change (90% confidence): 5 points or 10% points  SPADI: 40/130, 30.8% impaired  COGNITION: Overall cognitive status: Within functional limits for  tasks assessed  SENSATION: No N/T down L side But L ear has been  numb since his stroke  POSTURE: rounded shoulders, forward head, and increased thoracic kyphosis  PALPATION: Tightness in suboccipitals, cervical paraspinals, upper trap (L>R), palpable trigger points in B UE (L>R)  CERVICAL ROM:   Active ROM A/PROM (deg) eval  Pain  Flexion 40 -  Extension 33 Pain on L side  Right lateral flexion 45 Pain on L side  Left lateral flexion 25 Tension on L side  Right rotation 60 Pain at end range  Left rotation 37 Pain at end range   (Blank rows = not tested)  UPPER EXTREMITY ROM:  Active ROM Right eval Left eval  Shoulder flexion Limited to about 90 degrees Limited to about 90 degrees  Shoulder extension    Shoulder abduction Limited at end range Limited at end range, pain in L shoulder  Shoulder adduction    Shoulder extension    Shoulder internal rotation    Shoulder external rotation    Elbow flexion    Elbow extension    Wrist flexion    Wrist extension    Wrist ulnar deviation    Wrist radial deviation    Wrist pronation    Wrist supination     (Blank rows = not tested)  UPPER EXTREMITY MMT:  MMT Right eval Left eval  Shoulder flexion 3 3  Shoulder extension    Shoulder abduction 4 4  Shoulder adduction    Shoulder extension    Shoulder internal rotation    Shoulder external rotation    Middle trapezius    Lower trapezius    Elbow flexion    Elbow extension    Wrist flexion    Wrist extension    Wrist ulnar deviation    Wrist radial deviation    Wrist pronation    Wrist supination    Grip strength     (Blank rows = not tested)  CERVICAL SPECIAL TESTS:  None performed due to time constraints  TREATMENT:   Manual Therapy While patient seated in arm chair: IASTM to L cervical paraspinals and L UT region to address muscle pain and tightness. Educated patient that redness and bruising are possible in this area following treatment as well as increased soreness initially that should improve with continued  stretching and mobility of this region. TPR and MRF to L UT, L levator scap, and L cervical paraspinals to address trigger points in this region  While patient in supine on mat table: Suboccipital release 5 x 30-45 sec each L lateral cervical flexion PROM stretch 4 x 30 sec each L cervical flexion and rotation PROM stretch 4 x 30 sec each Trial of  Chirp Wheel XR x 5 min Pt unsure if this is helping at all      TherEx ***  NMR ***  PATIENT EDUCATION:  Education details: continue HEP*** Person educated: Patient Education method: Medical illustrator Education comprehension: verbalized understanding, returned demonstration, and needs further education  HOME EXERCISE PROGRAM: Emailed to patient 04/26/2024 Access Code: [email or web address] URL: [email or web address] Date: 04/26/2024 Prepared by: Waddell Southgate  Exercises - Seated Upper Trapezius Stretch  - 1 x daily - 7 x weekly - 1 sets - 3-5 reps - 30-60 sec hold - Seated Levator Scapulae Stretch  - 1 x daily - 7 x weekly - 1 sets - 3-5 reps - 30-60 sec hold  ASSESSMENT:  CLINICAL IMPRESSION: Emphasis of skilled PT session on performing manual therapy via IASTM, suboccipital release, TPR, MFR, and PROM stretches to address muscle pain and tightness. Pt reports some relief of his pain following treatment this date. Encouraged him to continue to work on his stretches at home following treatment this date. He continues to benefit from skilled PT services to work on increased independence with management of his pain symptoms. Continue POC.    OBJECTIVE IMPAIRMENTS: decreased activity tolerance, decreased knowledge of condition, decreased mobility, decreased ROM, decreased strength, impaired UE functional use, improper body mechanics, postural dysfunction, and pain.   ACTIVITY LIMITATIONS: carrying,  lifting, bending, and reach over head  PARTICIPATION LIMITATIONS: cleaning, community activity, and yard work  PERSONAL FACTORS: Age, Past/current experiences, Time since onset of injury/illness/exacerbation, and 3+ comorbidities:  asthma, AAA s/p repair in 2020, Afib, HTN, low back pain, glaucoma, CVA (2022) are also affecting patient's functional outcome.   REHAB POTENTIAL: Good  CLINICAL DECISION MAKING: Stable/uncomplicated  EVALUATION COMPLEXITY: Low   GOALS: Goals reviewed with patient? Yes  SHORT TERM GOALS: Target date: 05/17/2024***   Pt will be independent with initial HEP for improved cervical ROM, improved posture and management of pain symptoms in order to build upon functional gains made in therapy. Baseline:  Goal status: INITIAL   LONG TERM GOALS: Target date: 06/07/2024   Pt will be independent with final HEP for improved cervical ROM, improved posture and management of pain symptoms in order to build upon functional gains made in therapy. Baseline:  Goal status: INITIAL  2.  Pt will improve his score on the NDI to </= 16/50 to demonstrate decreased pain and improved function. Baseline: 21/50 (7/1) Goal status: INITIAL  3.  Pt will improve his score on the SPADI to </= 27 points and/or 20.8% to demonstrate decreased pain and improved function. Baseline: 40 points and/or 30.8% (7/1) Goal status: INITIAL  4.  Pt will increase his cervical AROM by >/= 10 degrees in limited motions for improved function. Baseline: see eval Goal status: INITIAL  5.  Pt will increase his shoulder AROM to WNL for improved function. Baseline: see eval Goal status: INITIAL  6.  Pt will increase his B shoulder strength by one muscle grade to demonstrate improved function. Baseline: see eval Goal status: INITIAL   PLAN:  PT FREQUENCY: 1x/week  PT DURATION: 6 weeks  PLANNED INTERVENTIONS: 97164- PT Re-evaluation, 97750- Physical Performance Testing, 97110-Therapeutic  exercises, 97530- Therapeutic activity, [id number]- Neuromuscular re-education, 97535- Self Care, [id number]- Manual therapy, [id number]- Aquatic Therapy, [phone]- Electrical stimulation (manual), Patient/Family education, Taping, Joint mobilization, Spinal mobilization, Cryotherapy, and Moist heat  PLAN FOR NEXT SESSION: how are initial two stretches? Add to HEP, manual therapy (cupping, IASTM), shoulder PROM with progression to dowel exercises, L scapular mobility***   Waddell Southgate, PT Waddell Southgate, PT, DPT, CSRS  05/19/2024, 7:28 AM

## 2024-05-24 ENCOUNTER — Ambulatory Visit: Admitting: Physical Therapy

## 2024-05-24 ENCOUNTER — Telehealth: Payer: Self-pay | Admitting: Physical Therapy

## 2024-05-24 NOTE — Therapy (Incomplete)
 OUTPATIENT PHYSICAL THERAPY CERVICAL TREATMENT   Patient Name: Tyler Burns MRN: [id number] DOB:1956/06/08, 68 y.o., male Today's Date: 05/24/2024  END OF SESSION:     Past Medical History:  Diagnosis Date   Asthma    Carotid artery occlusion    Dysrhythmia    Afib   GERD (gastroesophageal reflux disease)    Glaucoma    High cholesterol    History of cardiovascular stress test    Myoview  12/19:  EF 50%, No ischemia, Low Risk   Hypertension    Stroke [facility])    August 2022   Past Surgical History:  Procedure Laterality Date   ABDOMINAL AORTIC ANEURYSM REPAIR N/A 05/25/2019   Procedure: ANEURYSM ABDOMINAL AORTIC REPAIR using a HEMASHIELD GOLD VASCULAR GRAFT;  Surgeon: Oris Krystal FALCON, MD;  Location: [facility] OR;  Service: Vascular;  Laterality: N/A;   APPENDECTOMY     EYE SURGERY Right    cataract   INGUINAL HERNIA REPAIR Right 06/15/2015   Procedure: RIGHT INGUINAL HERNIA REPAIR WITH MESH;  Surgeon: Krystal Russell, MD;  Location: [facility] OR;  Service: General;  Laterality: Right;   INSERTION OF MESH Right 06/15/2015   Procedure: INSERTION OF MESH;  Surgeon: Krystal Russell, MD;  Location: [facility] OR;  Service: General;  Laterality: Right;   TRANSCAROTID ARTERY REVASCULARIZATION  Right 06/05/2021   Procedure: RIGHT TRANSCAROTID ARTERY REVASCULARIZATION;  Surgeon: Gretta Lonni PARAS, MD;  Location: [facility] OR;  Service: Vascular;  Laterality: Right;   VASCULAR SURGERY     Patient Active Problem List   Diagnosis Date Noted   Hypertensive emergency 03/25/2024   Glucose intolerance 07/21/2022   Cerebrovascular accident (CVA) (HCC) 06/25/2021   Carotid artery stenosis 06/05/2021   Carotid artery stenosis, symptomatic, right 06/05/2021   Essential hypertension 05/28/2021   Hyperlipidemia 05/28/2021   Cerebral infarction (HCC) 05/27/2021   AAA (abdominal aortic aneurysm) (HCC) 05/25/2019   Atrial fibrillation (HCC) 09/20/2013    PCP: Norval Kettle, MD  REFERRING PROVIDER: Kara Dorn NOVAK, MD  REFERRING DIAG: M54.2 (ICD-10-CM) - Neck pain  THERAPY DIAG:  No diagnosis found.  Rationale for Evaluation and Treatment: Rehabilitation  ONSET DATE: 03/26/2024  SUBJECTIVE:                                                                                                                                                                                                         SUBJECTIVE STATEMENT: Pt reports that his pain is about the same, it doesn't ever change unless it gets worse. Pt didn't really notice a difference in his muscle pain and  tightness with the exercises from last visit.  ***  From Eval: Pt reports that he had a stroke a few years ago (2022) and that ever since then he has had a pain down the L back side of his neck down into his L shoulder blade. Pt reports that it feels like his muscles are locked up and it feels like someone is squeezing his shoulder and pushing down. Pt reports that this sensation is constant, it does get worse if he tries to use his L arm or reach for something. He will also get headaches if he does a lot of activity during the day, right at the base of his skull.  He did see PT before to try and address this issue, went 4-5 times. However, he reports that he went home hurting 3-4 times worse than before therapy because the exercises they were giving him flared his pain up even more.  Pt interested in trying PT again to try to reduce his pain.   Hand dominance: Right  PERTINENT HISTORY:  PMH: asthma, AAA s/p repair in 2020, Afib, HTN, low back pain, glaucoma, CVA (2022)  PAIN:  Are you having pain? Yes: NPRS scale: 5/10 Pain location: L back side of neck into L shoulder blade Pain description: locking up, gripping, squeezing Aggravating factors: reaching, using L arm for anything Relieving factors: rotating my arms - keeps it from locking up but doesn't relieve the pain  PRECAUTIONS: None  RED FLAGS: None    WEIGHT BEARING  RESTRICTIONS: No  FALLS:  Has patient fallen in last 6 months? No  LIVING ENVIRONMENT: Lives with: lives alone, girlfriend  OCCUPATION: retired  PLOF: Independent  PATIENT GOALS: see if you can't get that pulled muscle out  NEXT MD VISIT: Nothing currently scheduled  OBJECTIVE:  Note: Objective measures were completed at Evaluation unless otherwise noted.  DIAGNOSTIC FINDINGS:   MRI Brain 03/25/24 1. No acute intracranial abnormality. 2. 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  CT Angio Head/Neck 03/25/2024 IMPRESSION: 1. No emergent large vessel occlusion. 2. Patent right ICA stent. 3. 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   Aortic Atherosclerosis (ICD10-I70.0).  PATIENT SURVEYS:  NDI:  NECK DISABILITY INDEX  Date: 04/26/2024 Score  Pain intensity 1 = The pain is very mild at the moment  2. Personal care (washing, dressing, etc.) 1 =  I can look after myself normally but it causes extra pain  3. Lifting 3 = Pain prevents me from lifting heavy weights but I can manage light to medium   weights if they are conveniently positioned  4. Reading 2 =  I can read as much as I want with moderate pain in my neck  5. Headaches 2 =  I have moderate headaches, which come infrequently  6. Concentration 0 =  I can concentrate fully when I want to with no difficulty  7. Work 3 =  I cannot do my usual work  8. Driving 2 =  I can drive my car as long as I want with moderate pain in my neck  9. Sleeping 2 = My sleep is mildly disturbed (1-2 hrs sleepless)  10. Recreation 5 = I can't do any recreation activities at all  Total 21/50   Minimum Detectable Change (90% confidence): 5 points or 10% points  SPADI: 40/130, 30.8% impaired  COGNITION: Overall cognitive status: Within functional limits for  tasks assessed  SENSATION: No N/T down L side But L ear has been  numb since his stroke  POSTURE: rounded shoulders, forward head, and increased thoracic kyphosis  PALPATION: Tightness in suboccipitals, cervical paraspinals, upper trap (L>R), palpable trigger points in B UE (L>R)  CERVICAL ROM:   Active ROM A/PROM (deg) eval  Pain  Flexion 40 -  Extension 33 Pain on L side  Right lateral flexion 45 Pain on L side  Left lateral flexion 25 Tension on L side  Right rotation 60 Pain at end range  Left rotation 37 Pain at end range   (Blank rows = not tested)  UPPER EXTREMITY ROM:  Active ROM Right eval Left eval  Shoulder flexion Limited to about 90 degrees Limited to about 90 degrees  Shoulder extension    Shoulder abduction Limited at end range Limited at end range, pain in L shoulder  Shoulder adduction    Shoulder extension    Shoulder internal rotation    Shoulder external rotation    Elbow flexion    Elbow extension    Wrist flexion    Wrist extension    Wrist ulnar deviation    Wrist radial deviation    Wrist pronation    Wrist supination     (Blank rows = not tested)  UPPER EXTREMITY MMT:  MMT Right eval Left eval  Shoulder flexion 3 3  Shoulder extension    Shoulder abduction 4 4  Shoulder adduction    Shoulder extension    Shoulder internal rotation    Shoulder external rotation    Middle trapezius    Lower trapezius    Elbow flexion    Elbow extension    Wrist flexion    Wrist extension    Wrist ulnar deviation    Wrist radial deviation    Wrist pronation    Wrist supination    Grip strength     (Blank rows = not tested)  CERVICAL SPECIAL TESTS:  None performed due to time constraints  TREATMENT:   Manual Therapy While patient seated in arm chair: IASTM to L cervical paraspinals and L UT region to address muscle pain and tightness. Educated patient that redness and bruising are possible in this area following treatment as well as increased soreness initially that should improve with continued  stretching and mobility of this region. TPR and MRF to L UT, L levator scap, and L cervical paraspinals to address trigger points in this region  While patient in supine on mat table: Suboccipital release 5 x 30-45 sec each L lateral cervical flexion PROM stretch 4 x 30 sec each L cervical flexion and rotation PROM stretch 4 x 30 sec each Trial of  Chirp Wheel XR x 5 min Pt unsure if this is helping at all      TherEx ***  NMR ***  PATIENT EDUCATION:  Education details: continue HEP*** Person educated: Patient Education method: Medical illustrator Education comprehension: verbalized understanding, returned demonstration, and needs further education  HOME EXERCISE PROGRAM: Emailed to patient 04/26/2024 Access Code: [email or web address] URL: [email or web address] Date: 04/26/2024 Prepared by: Waddell Southgate  Exercises - Seated Upper Trapezius Stretch  - 1 x daily - 7 x weekly - 1 sets - 3-5 reps - 30-60 sec hold - Seated Levator Scapulae Stretch  - 1 x daily - 7 x weekly - 1 sets - 3-5 reps - 30-60 sec hold  ASSESSMENT:  CLINICAL IMPRESSION: Emphasis of skilled PT session on performing manual therapy via IASTM, suboccipital release, TPR, MFR, and PROM stretches to address muscle pain and tightness. Pt reports some relief of his pain following treatment this date. Encouraged him to continue to work on his stretches at home following treatment this date. He continues to benefit from skilled PT services to work on increased independence with management of his pain symptoms. Continue POC.    OBJECTIVE IMPAIRMENTS: decreased activity tolerance, decreased knowledge of condition, decreased mobility, decreased ROM, decreased strength, impaired UE functional use, improper body mechanics, postural dysfunction, and pain.   ACTIVITY LIMITATIONS: carrying,  lifting, bending, and reach over head  PARTICIPATION LIMITATIONS: cleaning, community activity, and yard work  PERSONAL FACTORS: Age, Past/current experiences, Time since onset of injury/illness/exacerbation, and 3+ comorbidities:  asthma, AAA s/p repair in 2020, Afib, HTN, low back pain, glaucoma, CVA (2022) are also affecting patient's functional outcome.   REHAB POTENTIAL: Good  CLINICAL DECISION MAKING: Stable/uncomplicated  EVALUATION COMPLEXITY: Low   GOALS: Goals reviewed with patient? Yes  SHORT TERM GOALS: Target date: 05/17/2024***   Pt will be independent with initial HEP for improved cervical ROM, improved posture and management of pain symptoms in order to build upon functional gains made in therapy. Baseline:  Goal status: INITIAL   LONG TERM GOALS: Target date: 06/07/2024   Pt will be independent with final HEP for improved cervical ROM, improved posture and management of pain symptoms in order to build upon functional gains made in therapy. Baseline:  Goal status: INITIAL  2.  Pt will improve his score on the NDI to </= 16/50 to demonstrate decreased pain and improved function. Baseline: 21/50 (7/1) Goal status: INITIAL  3.  Pt will improve his score on the SPADI to </= 27 points and/or 20.8% to demonstrate decreased pain and improved function. Baseline: 40 points and/or 30.8% (7/1) Goal status: INITIAL  4.  Pt will increase his cervical AROM by >/= 10 degrees in limited motions for improved function. Baseline: see eval Goal status: INITIAL  5.  Pt will increase his shoulder AROM to WNL for improved function. Baseline: see eval Goal status: INITIAL  6.  Pt will increase his B shoulder strength by one muscle grade to demonstrate improved function. Baseline: see eval Goal status: INITIAL   PLAN:  PT FREQUENCY: 1x/week  PT DURATION: 6 weeks  PLANNED INTERVENTIONS: 97164- PT Re-evaluation, 97750- Physical Performance Testing, 97110-Therapeutic  exercises, 97530- Therapeutic activity, [id number]- Neuromuscular re-education, 97535- Self Care, [id number]- Manual therapy, [id number]- Aquatic Therapy, [phone]- Electrical stimulation (manual), Patient/Family education, Taping, Joint mobilization, Spinal mobilization, Cryotherapy, and Moist heat  PLAN FOR NEXT SESSION: how are initial two stretches? Add to HEP, manual therapy (cupping, IASTM), shoulder PROM with progression to dowel exercises, L scapular mobility***   Waddell Southgate, PT Waddell Southgate, PT, DPT, CSRS  05/24/2024, 7:41 AM

## 2024-05-31 ENCOUNTER — Ambulatory Visit: Attending: Pulmonary Disease | Admitting: Physical Therapy

## 2024-05-31 DIAGNOSIS — M25512 Pain in left shoulder: Secondary | ICD-10-CM | POA: Diagnosis present

## 2024-05-31 DIAGNOSIS — M6281 Muscle weakness (generalized): Secondary | ICD-10-CM | POA: Insufficient documentation

## 2024-05-31 DIAGNOSIS — G8929 Other chronic pain: Secondary | ICD-10-CM | POA: Insufficient documentation

## 2024-05-31 DIAGNOSIS — M542 Cervicalgia: Secondary | ICD-10-CM | POA: Insufficient documentation

## 2024-05-31 NOTE — Therapy (Signed)
 OUTPATIENT PHYSICAL THERAPY CERVICAL TREATMENT   Patient Name: Tyler Burns MRN: [id number] DOB:1956/06/23, 68 y.o., male Today's Date: 05/31/2024  END OF SESSION:  PT End of Session - 05/31/24 1151     Visit Number 3    Number of Visits 7   with eval   Date for PT Re-Evaluation 06/21/24   to allow for scheduling delays   Authorization Type [employer]    PT Start Time 1150   this therapist running behind   PT Stop Time 1228    PT Time Calculation (min) 38 min    Activity Tolerance Patient tolerated treatment well    Behavior During Therapy [facility] for tasks assessed/performed            Past Medical History:  Diagnosis Date   Asthma    Carotid artery occlusion    Dysrhythmia    Afib   GERD (gastroesophageal reflux disease)    Glaucoma    High cholesterol    History of cardiovascular stress test    Myoview  12/19:  EF 50%, No ischemia, Low Risk   Hypertension    Stroke [facility])    August 2022   Past Surgical History:  Procedure Laterality Date   ABDOMINAL AORTIC ANEURYSM REPAIR N/A 05/25/2019   Procedure: ANEURYSM ABDOMINAL AORTIC REPAIR using a HEMASHIELD GOLD VASCULAR GRAFT;  Surgeon: Oris Krystal FALCON, MD;  Location: [facility] OR;  Service: Vascular;  Laterality: N/A;   APPENDECTOMY     EYE SURGERY Right    cataract   INGUINAL HERNIA REPAIR Right 06/15/2015   Procedure: RIGHT INGUINAL HERNIA REPAIR WITH MESH;  Surgeon: Krystal Russell, MD;  Location: [facility] OR;  Service: General;  Laterality: Right;   INSERTION OF MESH Right 06/15/2015   Procedure: INSERTION OF MESH;  Surgeon: Krystal Russell, MD;  Location: [facility] OR;  Service: General;  Laterality: Right;   TRANSCAROTID ARTERY REVASCULARIZATION  Right 06/05/2021   Procedure: RIGHT TRANSCAROTID ARTERY REVASCULARIZATION;  Surgeon: Gretta Lonni PARAS, MD;  Location: [facility] [place] OR;  Service: Vascular;  Laterality: Right;   VASCULAR SURGERY     Patient Active Problem List   Diagnosis Date Noted   Hypertensive emergency 03/25/2024   Glucose  intolerance 07/21/2022   Cerebrovascular accident (CVA) (HCC) 06/25/2021   Carotid artery stenosis 06/05/2021   Carotid artery stenosis, symptomatic, right 06/05/2021   Essential hypertension 05/28/2021   Hyperlipidemia 05/28/2021   Cerebral infarction (HCC) 05/27/2021   AAA (abdominal aortic aneurysm) (HCC) 05/25/2019   Atrial fibrillation (HCC) 09/20/2013    PCP: Norval Kettle, MD  REFERRING PROVIDER: Kara Dorn NOVAK, MD  REFERRING DIAG: M54.2 (ICD-10-CM) - Neck pain  THERAPY DIAG:  Muscle weakness (generalized)  Cervicalgia  Chronic left shoulder pain  Rationale for Evaluation and Treatment: Rehabilitation  ONSET DATE: 03/26/2024  SUBJECTIVE:  SUBJECTIVE STATEMENT:  Pt reports that his pain was doing better but he had to miss PT for a few weeks and it flared up again. He has been dealing with issues with his eyes, got something in his eye from the weedeater and it turned into an ulcer, etc. He reports that today he is not really having pain so much as stiffness.  From Eval: Pt reports that he had a stroke a few years ago (2022) and that ever since then he has had a pain down the L back side of his neck down into his L shoulder blade. Pt reports that it feels like his muscles are locked up and it feels like someone is squeezing his shoulder and pushing down. Pt reports that this sensation is constant, it does get worse if he tries to use his L arm or reach for something. He will also get headaches if he does a lot of activity during the day, right at the base of his skull.  He did see PT before to try and address this issue, went 4-5 times. However, he reports that he went home hurting 3-4 times worse than before therapy because the exercises they were giving him flared his pain  up even more.  Pt interested in trying PT again to try to reduce his pain.   Hand dominance: Right  PERTINENT HISTORY:  PMH: asthma, AAA s/p repair in 2020, Afib, HTN, low back pain, glaucoma, CVA (2022)  PAIN:  Are you having pain? Yes: NPRS scale: 5/10 Pain location: L back side of neck into L shoulder blade Pain description: locking up, gripping, squeezing Aggravating factors: reaching, using L arm for anything Relieving factors: rotating my arms - keeps it from locking up but doesn't relieve the pain  PRECAUTIONS: None  RED FLAGS: None    WEIGHT BEARING RESTRICTIONS: No  FALLS:  Has patient fallen in last 6 months? No  LIVING ENVIRONMENT: Lives with: lives alone, girlfriend  OCCUPATION: retired  PLOF: Independent  PATIENT GOALS: see if you can't get that pulled muscle out  NEXT MD VISIT: Nothing currently scheduled  OBJECTIVE:  Note: Objective measures were completed at Evaluation unless otherwise noted.  DIAGNOSTIC FINDINGS:   MRI Brain 03/25/24 1. No acute intracranial abnormality. 2. 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  CT Angio Head/Neck 03/25/2024 IMPRESSION: 1. No emergent large vessel occlusion. 2. Patent right ICA stent. 3. 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   Aortic Atherosclerosis (ICD10-I70.0).  PATIENT SURVEYS:  NDI:  NECK DISABILITY INDEX  Date: 04/26/2024 Score  Pain intensity 1 = The pain is very mild at the moment  2. Personal care (washing, dressing, etc.) 1 =  I can look after myself normally but it causes extra pain  3. Lifting 3 = Pain prevents me from lifting heavy weights but I can manage light to medium   weights if they are conveniently positioned  4. Reading 2 =  I can read as much as I want with moderate pain in my neck  5. Headaches 2 =  I have moderate headaches, which come infrequently  6.  Concentration 0 =  I can concentrate fully when I want to with no difficulty  7. Work 3 =  I cannot do my usual work  8. Driving 2 =  I can drive my car as long as I want with moderate pain in my  neck  9. Sleeping 2 = My sleep is mildly disturbed (1-2 hrs sleepless)  10. Recreation 5 = I can't do any recreation activities at all  Total 21/50   Minimum Detectable Change (90% confidence): 5 points or 10% points  SPADI: 40/130, 30.8% impaired  COGNITION: Overall cognitive status: Within functional limits for tasks assessed  SENSATION: No N/T down L side But L ear has been numb since his stroke  POSTURE: rounded shoulders, forward head, and increased thoracic kyphosis  PALPATION: Tightness in suboccipitals, cervical paraspinals, upper trap (L>R), palpable trigger points in B UE (L>R)  CERVICAL ROM:   Active ROM A/PROM (deg) eval  Pain  Flexion 40 -  Extension 33 Pain on L side  Right lateral flexion 45 Pain on L side  Left lateral flexion 25 Tension on L side  Right rotation 60 Pain at end range  Left rotation 37 Pain at end range   (Blank rows = not tested)  UPPER EXTREMITY ROM:  Active ROM Right eval Left eval  Shoulder flexion Limited to about 90 degrees Limited to about 90 degrees  Shoulder extension    Shoulder abduction Limited at end range Limited at end range, pain in L shoulder  Shoulder adduction    Shoulder extension    Shoulder internal rotation    Shoulder external rotation    Elbow flexion    Elbow extension    Wrist flexion    Wrist extension    Wrist ulnar deviation    Wrist radial deviation    Wrist pronation    Wrist supination     (Blank rows = not tested)  UPPER EXTREMITY MMT:  MMT Right eval Left eval  Shoulder flexion 3 3  Shoulder extension    Shoulder abduction 4 4  Shoulder adduction    Shoulder extension    Shoulder internal rotation    Shoulder external rotation    Middle trapezius    Lower trapezius    Elbow flexion     Elbow extension    Wrist flexion    Wrist extension    Wrist ulnar deviation    Wrist radial deviation    Wrist pronation    Wrist supination    Grip strength     (Blank rows = not tested)  CERVICAL SPECIAL TESTS:  None performed due to time constraints  TREATMENT:   Manual Therapy While patient in sidelying on mat table:  IASTM to L cervical paraspinals and L UT region and well as to L rhomboids to address muscle pain and tightness. Educated patient that redness and bruising are possible in this area following treatment as well as increased soreness initially that should improve with continued stretching and mobility of this region. TPR and MRF to L UT, L levator scap, L cervical paraspinals, and L rhomboids to address trigger points in this region.  TherAct Demonstrated theracane to address muscle tightness in these regions. Pt able to trial theracane, not interested in purchasing at this time. Trialed seated rhomobid stretch/posterior capsule stretch, no stretch felt Trialed thread the needle in child's pose, no stretch felt in rhomboids Standing forwards facing wall slides x 10 reps, good stretch felt Added to HEP, see bolded below  PATIENT EDUCATION:  Education details: continue HEP, added to HEP, PT POC with plan to possibly d/c next visit vs add appointments Person educated: Patient Education method: Explanation and Demonstration Education comprehension: verbalized understanding, returned demonstration, and needs further education  HOME EXERCISE PROGRAM: Emailed to patient 04/26/2024 Access Code: [email or web address] URL: [email or web address] Date: 04/26/2024 Prepared by: Waddell Southgate  Exercises - Seated Upper Trapezius Stretch  - 1 x daily - 7 x weekly - 1 sets - 3-5 reps - 30-60 sec hold - Seated Levator Scapulae Stretch  - 1 x daily - 7 x weekly - 1  sets - 3-5 reps - 30-60 sec hold - Standing shoulder flexion wall slides  - 1 x daily - 7 x weekly - 3 sets - 10 reps  ASSESSMENT:  CLINICAL IMPRESSION: Emphasis of skilled PT session on performing manual therapy via IASTM, TPR, and MFR to address muscle pain and tightness. Pt reports some relief of his pain following treatment this date. Encouraged him to continue to work on his stretches at home following treatment this date, also added wall slides to HEP. He continues to benefit from skilled PT services to work on increased independence with management of his pain symptoms. Pt has met 1/1 STG due to being independent with his initial HEP. Continue POC.    OBJECTIVE IMPAIRMENTS: decreased activity tolerance, decreased knowledge of condition, decreased mobility, decreased ROM, decreased strength, impaired UE functional use, improper body mechanics, postural dysfunction, and pain.   ACTIVITY LIMITATIONS: carrying, lifting, bending, and reach over head  PARTICIPATION LIMITATIONS: cleaning, community activity, and yard work  PERSONAL FACTORS: Age, Past/current experiences, Time since onset of injury/illness/exacerbation, and 3+ comorbidities:  asthma, AAA s/p repair in 2020, Afib, HTN, low back pain, glaucoma, CVA (2022) are also affecting patient's functional outcome.   REHAB POTENTIAL: Good  CLINICAL DECISION MAKING: Stable/uncomplicated  EVALUATION COMPLEXITY: Low   GOALS: Goals reviewed with patient? Yes  SHORT TERM GOALS: Target date: 05/17/2024   Pt will be independent with initial HEP for improved cervical ROM, improved posture and management of pain symptoms in order to build upon functional gains made in therapy. Baseline:  Goal status: MET   LONG TERM GOALS: Target date: 06/07/2024   Pt will be independent with final HEP for improved cervical ROM, improved posture and management of pain symptoms in order to build upon functional gains made in therapy. Baseline:  Goal  status: INITIAL  2.  Pt will improve his score on the NDI to </= 16/50 to demonstrate decreased pain and improved function. Baseline: 21/50 (7/1) Goal status: INITIAL  3.  Pt will improve his score on the SPADI to </= 27 points and/or 20.8% to demonstrate decreased pain and improved function. Baseline: 40 points and/or 30.8% (7/1) Goal status: INITIAL  4.  Pt will increase his cervical AROM by >/= 10 degrees in limited motions for improved function. Baseline: see eval Goal status: INITIAL  5.  Pt will increase his shoulder AROM to WNL for improved function. Baseline: see eval Goal status: INITIAL  6.  Pt will increase his B shoulder strength by one muscle grade to demonstrate improved function. Baseline: see eval Goal status: INITIAL   PLAN:  PT FREQUENCY: 1x/week  PT DURATION: 6 weeks  PLANNED INTERVENTIONS: [id number]- PT Re-evaluation, 97750- Physical Performance Testing, 97110-Therapeutic exercises, 97530- Therapeutic activity, [id number]- Neuromuscular re-education, 97535- Self Care, [id number]- Manual therapy, [id number]- Aquatic Therapy, [phone]- Electrical stimulation (manual), Patient/Family education, Taping, Joint mobilization, Spinal mobilization, Cryotherapy, and Moist heat  PLAN FOR  NEXT SESSION: how is HEP? Add to HEP, manual therapy (cupping, IASTM), shoulder PROM with progression to dowel exercises, assess LTG and d/c vs add visits   Waddell Southgate, PT Waddell Southgate, PT, DPT, CSRS  05/31/2024, 12:33 PM

## 2024-06-07 ENCOUNTER — Ambulatory Visit: Admitting: Physical Therapy

## 2024-06-07 DIAGNOSIS — M542 Cervicalgia: Secondary | ICD-10-CM

## 2024-06-07 DIAGNOSIS — G8929 Other chronic pain: Secondary | ICD-10-CM

## 2024-06-07 DIAGNOSIS — M6281 Muscle weakness (generalized): Secondary | ICD-10-CM

## 2024-06-07 NOTE — Therapy (Signed)
 OUTPATIENT PHYSICAL THERAPY CERVICAL TREATMENT - RECERTIFICATION   Patient Name: Tyler Burns MRN: [id number] DOB:1956-02-10, 68 y.o., male Today's Date: 06/07/2024     END OF SESSION:  PT End of Session - 06/07/24 1101     Visit Number 4    Number of Visits 8   recert   Date for PT Re-Evaluation 07/19/24   recert, to allow for scheduling delays   Authorization Type [employer]    PT Start Time 1100    PT Stop Time 1141    PT Time Calculation (min) 41 min    Activity Tolerance Patient limited by pain    Behavior During Therapy [facility] for tasks assessed/performed             Past Medical History:  Diagnosis Date   Asthma    Carotid artery occlusion    Dysrhythmia    Afib   GERD (gastroesophageal reflux disease)    Glaucoma    High cholesterol    History of cardiovascular stress test    Myoview  12/19:  EF 50%, No ischemia, Low Risk   Hypertension    Stroke [facility])    August 2022   Past Surgical History:  Procedure Laterality Date   ABDOMINAL AORTIC ANEURYSM REPAIR N/A 05/25/2019   Procedure: ANEURYSM ABDOMINAL AORTIC REPAIR using a HEMASHIELD GOLD VASCULAR GRAFT;  Surgeon: Oris Krystal FALCON, MD;  Location: [facility] OR;  Service: Vascular;  Laterality: N/A;   APPENDECTOMY     EYE SURGERY Right    cataract   INGUINAL HERNIA REPAIR Right 06/15/2015   Procedure: RIGHT INGUINAL HERNIA REPAIR WITH MESH;  Surgeon: Krystal Russell, MD;  Location: [facility] OR;  Service: General;  Laterality: Right;   INSERTION OF MESH Right 06/15/2015   Procedure: INSERTION OF MESH;  Surgeon: Krystal Russell, MD;  Location: [facility] OR;  Service: General;  Laterality: Right;   TRANSCAROTID ARTERY REVASCULARIZATION  Right 06/05/2021   Procedure: RIGHT TRANSCAROTID ARTERY REVASCULARIZATION;  Surgeon: Gretta Lonni PARAS, MD;  Location: [facility] OR;  Service: Vascular;  Laterality: Right;   VASCULAR SURGERY     Patient Active Problem List   Diagnosis Date Noted   Hypertensive emergency 03/25/2024   Glucose intolerance  07/21/2022   Cerebrovascular accident (CVA) (HCC) 06/25/2021   Carotid artery stenosis 06/05/2021   Carotid artery stenosis, symptomatic, right 06/05/2021   Essential hypertension 05/28/2021   Hyperlipidemia 05/28/2021   Cerebral infarction (HCC) 05/27/2021   AAA (abdominal aortic aneurysm) (HCC) 05/25/2019   Atrial fibrillation (HCC) 09/20/2013    PCP: Norval Kettle, MD  REFERRING PROVIDER: Kara Dorn NOVAK, MD  REFERRING DIAG: M54.2 (ICD-10-CM) - Neck pain  THERAPY DIAG:  Muscle weakness (generalized)  Cervicalgia  Chronic left shoulder pain  Rationale for Evaluation and Treatment: Rehabilitation  ONSET DATE: 03/26/2024  SUBJECTIVE:  SUBJECTIVE STATEMENT:  Pt reports that he felt a little tense after last visit but he does feel better after each visit. 3/10 pain today. He reports that weed-eating, laundry, etc will still flare up his pain. He is wondering if the pain will ever go away.  Pt reports that his initial two stretches are still helpful, wall slides added last visit irritate his back pain.  From Eval: Pt reports that he had a stroke a few years ago (2022) and that ever since then he has had a pain down the L back side of his neck down into his L shoulder blade. Pt reports that it feels like his muscles are locked up and it feels like someone is squeezing his shoulder and pushing down. Pt reports that this sensation is constant, it does get worse if he tries to use his L arm or reach for something. He will also get headaches if he does a lot of activity during the day, right at the base of his skull.  He did see PT before to try and address this issue, went 4-5 times. However, he reports that he went home hurting 3-4 times worse than before therapy because the exercises  they were giving him flared his pain up even more.  Pt interested in trying PT again to try to reduce his pain.   Hand dominance: Right  PERTINENT HISTORY:  PMH: asthma, AAA s/p repair in 2020, Afib, HTN, low back pain, glaucoma, CVA (2022)  PAIN:  Are you having pain? Yes: NPRS scale: 3/10 Pain location: L back side of neck into L shoulder blade Pain description: locking up, gripping, squeezing Aggravating factors: reaching, using L arm for anything Relieving factors: rotating my arms - keeps it from locking up but doesn't relieve the pain  PRECAUTIONS: None  RED FLAGS: None    WEIGHT BEARING RESTRICTIONS: No  FALLS:  Has patient fallen in last 6 months? No  LIVING ENVIRONMENT: Lives with: lives alone, girlfriend  OCCUPATION: retired  PLOF: Independent  PATIENT GOALS: see if you can't get that pulled muscle out  NEXT MD VISIT: Nothing currently scheduled  OBJECTIVE:  Note: Objective measures were completed at Evaluation unless otherwise noted.  DIAGNOSTIC FINDINGS:   MRI Brain 03/25/24 1. No acute intracranial abnormality. 2. 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  CT Angio Head/Neck 03/25/2024 IMPRESSION: 1. No emergent large vessel occlusion. 2. Patent right ICA stent. 3. 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   Aortic Atherosclerosis (ICD10-I70.0).  PATIENT SURVEYS:  NDI:  NECK DISABILITY INDEX  Date: 04/26/2024 Score  Pain intensity 1 = The pain is very mild at the moment  2. Personal care (washing, dressing, etc.) 1 =  I can look after myself normally but it causes extra pain  3. Lifting 3 = Pain prevents me from lifting heavy weights but I can manage light to medium   weights if they are conveniently positioned  4. Reading 2 =  I can read as much as I want with moderate pain in my neck  5. Headaches 2 =  I have moderate  headaches, which come infrequently  6. Concentration 0 =  I can concentrate fully when I want to with no difficulty  7. Work 3 =  I cannot do my usual work  8. Driving 2 =  I can drive my car as long as I want with moderate  pain in my neck  9. Sleeping 2 = My sleep is mildly disturbed (1-2 hrs sleepless)  10. Recreation 5 = I can't do any recreation activities at all  Total 21/50   Minimum Detectable Change (90% confidence): 5 points or 10% points  SPADI: 40/130, 30.8% impaired  COGNITION: Overall cognitive status: Within functional limits for tasks assessed  SENSATION: No N/T down L side But L ear has been numb since his stroke  POSTURE: rounded shoulders, forward head, and increased thoracic kyphosis  PALPATION: Tightness in suboccipitals, cervical paraspinals, upper trap (L>R), palpable trigger points in B UE (L>R)  CERVICAL ROM:   Active ROM A/PROM (deg) eval  Pain  Flexion 40 -  Extension 33 Pain on L side  Right lateral flexion 45 Pain on L side  Left lateral flexion 25 Tension on L side  Right rotation 60 Pain at end range  Left rotation 37 Pain at end range   (Blank rows = not tested)  UPPER EXTREMITY ROM:  Active ROM Right eval Left eval  Shoulder flexion Limited to about 90 degrees Limited to about 90 degrees  Shoulder extension    Shoulder abduction Limited at end range Limited at end range, pain in L shoulder  Shoulder adduction    Shoulder extension    Shoulder internal rotation    Shoulder external rotation    Elbow flexion    Elbow extension    Wrist flexion    Wrist extension    Wrist ulnar deviation    Wrist radial deviation    Wrist pronation    Wrist supination     (Blank rows = not tested)  UPPER EXTREMITY MMT:  MMT Right eval Left eval  Shoulder flexion 3 3  Shoulder extension    Shoulder abduction 4 4  Shoulder adduction    Shoulder extension    Shoulder internal rotation    Shoulder external rotation    Middle trapezius     Lower trapezius    Elbow flexion    Elbow extension    Wrist flexion    Wrist extension    Wrist ulnar deviation    Wrist radial deviation    Wrist pronation    Wrist supination    Grip strength     (Blank rows = not tested)  CERVICAL SPECIAL TESTS:  None performed due to time constraints  TREATMENT:   TherAct Supine thoracic mobilization over towel roll with shoulder abduction x 10 reps, minimal stretch felt Transitioned to use of half foam roller, too thick so becomes painful Deferred this exercise Removed wall slides from HEP as these are painful, encouraged him to continue to work on remaining two stretches For LTG assessment: SPADI: 59/130, 45% impairment NDI: unable to reassess as pt unable to fill out survey accurately   CERVICAL ROM:   Active ROM A/PROM (deg) eval  Pain AROM (deg) 06/07/24  Pain  Flexion 40 - 50 -  Extension 33 Pain on L side 47 -  Right lateral flexion 45 Pain on L side 41 Pain at end-range on L side  Left lateral flexion 25 Tension on L side 21 Pain at end-range on L side  Right rotation 60 Pain at end range 55 Pain in R posterior neck  Left rotation 37 Pain at end range 55 Pain in L posterior neck   (Blank rows = not tested)                 UPPER EXTREMITY ROM:  Active  ROM Right eval Left eval Right 06/07/24 Left 06/07/24  Shoulder flexion Limited to about 90 degrees Limited to about 90 degrees 175 deg 175 deg  Shoulder abduction Limited at end range Limited at end range, pain in L shoulder 180 deg 160 deg   (Blank rows = not tested)        UPPER EXTREMITY MMT:  MMT Right eval Left eval Right 06/07/24 Left 06/07/24  Shoulder flexion 3 3 4 4   Shoulder abduction 4 4 5 5    (Blank rows = not tested)                                                                                           PATIENT EDUCATION:  Education details: continue HEP except for wall slides, will transition care to Karmesh to see if further manual therapy can  assist with reduction of his pain, results of reassessments this date and functional implications Person educated: Patient Education method: Explanation and Demonstration Education comprehension: verbalized understanding, returned demonstration, and needs further education  HOME EXERCISE PROGRAM: Emailed to patient 04/26/2024 Access Code: [email or web address] URL: [email or web address] Date: 04/26/2024 Prepared by: Waddell Southgate  Exercises - Seated Upper Trapezius Stretch  - 1 x daily - 7 x weekly - 1 sets - 3-5 reps - 30-60 sec hold - Seated Levator Scapulae Stretch  - 1 x daily - 7 x weekly - 1 sets - 3-5 reps - 30-60 sec hold  ASSESSMENT:  CLINICAL IMPRESSION: Emphasis of skilled PT session on assessing LTG with plan for either d/c from PT this date or adding visits to further attempt to address his ongoing pain. Last visit pt mentioned he was wanting to d/c this session but today he wants to add visits as he did miss some during his POC due other medical issues and car trouble. Reassessed LTG for recertification of PT services. Pt has made progress in some areas and has regressed in other areas as compared to his initial evaluation. He is unable to complete the NDI due to inability to understand what it is asking this session but able to reassess all other goals. He demonstrates improved B shoulder ROM and strength as compared to his initial evaluation but does have ongoing pain at end-range in B shoulder joints. He also exhibits increased cervical AROM in some motions but decreased AROM with other motions with ongoing pain and tightness in his L upper trap region. He also exhibits increased disability level based on his score on the SPADI as compared to his initial evaluation. Pt has met 2/6 LTG, has made progress towards 3/6 LTG, and did not meet 1/6 LTG. He is not open to TPDN at this time but could benefit from other manual therapy techniques to address his ongoing pain. He has shown an  improvement in his overall mobility and an overall reduction in pain that does return when performing yard work and chores around the house and he could benefit from further PT services to attempt to reduce his pain with these types of activities. Continue POC.    OBJECTIVE IMPAIRMENTS: decreased activity tolerance, decreased knowledge of condition, decreased mobility, decreased ROM, decreased  strength, impaired UE functional use, improper body mechanics, postural dysfunction, and pain.   ACTIVITY LIMITATIONS: carrying, lifting, bending, and reach over head  PARTICIPATION LIMITATIONS: cleaning, community activity, and yard work  PERSONAL FACTORS: Age, Past/current experiences, Time since onset of injury/illness/exacerbation, and 3+ comorbidities:  asthma, AAA s/p repair in 2020, Afib, HTN, low back pain, glaucoma, CVA (2022) are also affecting patient's functional outcome.   REHAB POTENTIAL: Good  CLINICAL DECISION MAKING: Stable/uncomplicated  EVALUATION COMPLEXITY: Low   GOALS: Goals reviewed with patient? Yes  SHORT TERM GOALS: Target date: 05/17/2024   Pt will be independent with initial HEP for improved cervical ROM, improved posture and management of pain symptoms in order to build upon functional gains made in therapy. Baseline:  Goal status: MET   LONG TERM GOALS: Target date: 06/07/2024  Pt will be independent with final HEP for improved cervical ROM, improved posture and management of pain symptoms in order to build upon functional gains made in therapy. Baseline: final HEP in progress, independent with current exercises (8/12) Goal status: IN PROGRESS  2.  Pt will improve his score on the NDI to </= 16/50 to demonstrate decreased pain and improved function. Baseline: 21/50 (7/1), unable to reassess (8/12) Goal status: IN PROGRESS  3.  Pt will improve his score on the SPADI to </= 27 points and/or 20.8% to demonstrate decreased pain and improved function. Baseline: 40  points and/or 30.8% (7/1), 59 points or 45% impairment (8/12) Goal status: NOT MET  4.  Pt will increase his cervical AROM by >/= 10 degrees in limited motions for improved function. Baseline: see eval, see recert note 8/12 Goal status: IN PROGRESS  5.  Pt will increase his shoulder AROM to WNL for improved function. Baseline: see eval, see above Goal status: MET  6.  Pt will increase his B shoulder strength by one muscle grade to demonstrate improved function. Baseline: see eval, see above Goal status: MET  NEW SHORT TERM GOALS=LONG TERM GOALS due to length of POC   NEW LONG TERM GOALS:  Target date: 07/19/2024   Pt will be independent with final HEP for improved cervical ROM, improved posture and management of pain symptoms in order to build upon functional gains made in therapy. Baseline: final HEP in progress, independent with current exercises (8/12) Goal status: IN PROGRESS  2.  Pt will improve his score on the NDI to </= 16/50 to demonstrate decreased pain and improved function. Baseline: 21/50 (7/1), unable to reassess (8/12) Goal status: IN PROGRESS  3.  Pt will improve his score on the SPADI to </= 40 points and/or 30.8% to demonstrate decreased pain and improved function. Baseline: 40 points and/or 30.8% (7/1), 59 points or 45% impairment (8/12) Goal status: IN PROGRESS  4.  Pt will increase his cervical AROM by >/= 10 degrees in limited motions for improved function. Baseline: see eval, see recert note 8/12 Goal status: IN PROGRESS  5.  Pt will report pain </= 5/10 with functional activities around his home in order to demonstrate improved function. Baseline: 8/10 (8/12) Goal status: INITIAL    PLAN:  PT FREQUENCY: 1x/week  PT DURATION: 6 weeks + 4 visits (recert)  PLANNED INTERVENTIONS: [id number]- PT Re-evaluation, 97750- Physical Performance Testing, 97110-Therapeutic exercises, 97530- Therapeutic activity, [id number]- Neuromuscular re-education, 97535- Self Care,  [id number]- Manual therapy, [id number]- Aquatic Therapy, [phone]- Electrical stimulation (manual), Patient/Family education, Taping, Joint mobilization, Spinal mobilization, Cryotherapy, and Moist heat  PLAN FOR NEXT SESSION: how is HEP? Add to HEP as  tolerated, manual therapy to address ongoing L levator scap and upper trap pain (have tried IASTM, not open to TPDN at this time) - pain will decrease with PT but flares up again with using weed-eater, doing laundry, etc.   Waddell Southgate, PT Waddell Southgate, PT, DPT, CSRS  06/07/2024, 1:20 PM

## 2024-06-28 ENCOUNTER — Ambulatory Visit

## 2024-07-06 ENCOUNTER — Ambulatory Visit

## 2024-07-13 ENCOUNTER — Ambulatory Visit

## 2024-07-19 ENCOUNTER — Ambulatory Visit

## 2024-11-28 ENCOUNTER — Other Ambulatory Visit: Payer: Self-pay

## 2024-11-28 DIAGNOSIS — I7142 Juxtarenal abdominal aortic aneurysm, without rupture: Secondary | ICD-10-CM

## 2025-01-03 ENCOUNTER — Ambulatory Visit: Admitting: Vascular Surgery

## 2025-01-03 ENCOUNTER — Ambulatory Visit (HOSPITAL_COMMUNITY)
